# Patient Record
Sex: Female | Born: 1945 | Race: Black or African American | Hispanic: No | Marital: Single | Smoking: Former smoker
Health system: Southern US, Community
[De-identification: ages and names within clinical notes are randomized; demographics above are authoritative.]

## PROBLEM LIST (undated history)

## (undated) DIAGNOSIS — H409 Unspecified glaucoma: Secondary | ICD-10-CM

## (undated) DIAGNOSIS — I10 Essential (primary) hypertension: Secondary | ICD-10-CM

## (undated) DIAGNOSIS — E119 Type 2 diabetes mellitus without complications: Secondary | ICD-10-CM

## (undated) DIAGNOSIS — N183 Chronic kidney disease, stage 3 unspecified: Secondary | ICD-10-CM

## (undated) DIAGNOSIS — M069 Rheumatoid arthritis, unspecified: Secondary | ICD-10-CM

## (undated) DIAGNOSIS — R570 Cardiogenic shock: Secondary | ICD-10-CM

## (undated) DIAGNOSIS — J449 Chronic obstructive pulmonary disease, unspecified: Secondary | ICD-10-CM

## (undated) DIAGNOSIS — I2510 Atherosclerotic heart disease of native coronary artery without angina pectoris: Secondary | ICD-10-CM

## (undated) DIAGNOSIS — I5032 Chronic diastolic (congestive) heart failure: Secondary | ICD-10-CM

## (undated) DIAGNOSIS — E785 Hyperlipidemia, unspecified: Secondary | ICD-10-CM

## (undated) HISTORY — DX: Chronic kidney disease, stage 3 unspecified: N18.30

## (undated) HISTORY — PX: CARDIAC CATHETERIZATION: SHX172

## (undated) HISTORY — DX: Chronic kidney disease, stage 3 (moderate): N18.3

## (undated) HISTORY — DX: Atherosclerotic heart disease of native coronary artery without angina pectoris: I25.10

## (undated) HISTORY — DX: Morbid (severe) obesity due to excess calories: E66.01

## (undated) HISTORY — PX: COLONOSCOPY: SHX174

## (undated) HISTORY — DX: Hyperlipidemia, unspecified: E78.5

## (undated) HISTORY — DX: Essential (primary) hypertension: I10

## (undated) HISTORY — DX: Type 2 diabetes mellitus without complications: E11.9

## (undated) HISTORY — DX: Rheumatoid arthritis, unspecified: M06.9

## (undated) HISTORY — DX: Unspecified glaucoma: H40.9

## (undated) HISTORY — DX: Cardiogenic shock: R57.0

## (undated) HISTORY — DX: Chronic diastolic (congestive) heart failure: I50.32

## (undated) HISTORY — PX: EYE SURGERY: SHX253

## (undated) HISTORY — DX: Chronic obstructive pulmonary disease, unspecified: J44.9

---

## 2000-02-24 HISTORY — PX: CORONARY ARTERY BYPASS GRAFT: SHX141

## 2006-05-20 ENCOUNTER — Ambulatory Visit: Payer: Self-pay

## 2007-03-15 ENCOUNTER — Ambulatory Visit: Payer: Self-pay | Admitting: Gastroenterology

## 2007-05-23 ENCOUNTER — Ambulatory Visit: Payer: Self-pay | Admitting: Family Medicine

## 2008-06-06 ENCOUNTER — Ambulatory Visit: Payer: Self-pay | Admitting: Family Medicine

## 2008-09-22 ENCOUNTER — Inpatient Hospital Stay: Payer: Self-pay | Admitting: Internal Medicine

## 2009-09-05 ENCOUNTER — Ambulatory Visit: Payer: Self-pay | Admitting: Family Medicine

## 2009-09-16 ENCOUNTER — Ambulatory Visit: Payer: Self-pay | Admitting: Family Medicine

## 2010-02-23 HISTORY — PX: CORONARY ARTERY BYPASS GRAFT: SHX141

## 2010-11-04 ENCOUNTER — Ambulatory Visit: Payer: Self-pay | Admitting: Family Medicine

## 2011-10-29 ENCOUNTER — Inpatient Hospital Stay: Payer: Self-pay | Admitting: Internal Medicine

## 2011-10-29 LAB — CBC
HCT: 41.5 % (ref 35.0–47.0)
HGB: 13 g/dL (ref 12.0–16.0)
MCH: 26.4 pg (ref 26.0–34.0)
MCHC: 31.3 g/dL — ABNORMAL LOW (ref 32.0–36.0)
MCV: 84 fL (ref 80–100)
Platelet: 291 10*3/uL (ref 150–440)
RBC: 4.92 10*6/uL (ref 3.80–5.20)
WBC: 8.2 10*3/uL (ref 3.6–11.0)

## 2011-10-29 LAB — COMPREHENSIVE METABOLIC PANEL
Albumin: 3.5 g/dL (ref 3.4–5.0)
Anion Gap: 12 (ref 7–16)
BUN: 37 mg/dL — ABNORMAL HIGH (ref 7–18)
Bilirubin,Total: 0.5 mg/dL (ref 0.2–1.0)
Calcium, Total: 9.6 mg/dL (ref 8.5–10.1)
Chloride: 109 mmol/L — ABNORMAL HIGH (ref 98–107)
Creatinine: 1.35 mg/dL — ABNORMAL HIGH (ref 0.60–1.30)
EGFR (African American): 48 — ABNORMAL LOW
EGFR (Non-African Amer.): 41 — ABNORMAL LOW
Glucose: 98 mg/dL (ref 65–99)
Potassium: 4.7 mmol/L (ref 3.5–5.1)
SGOT(AST): 330 U/L — ABNORMAL HIGH (ref 15–37)
SGPT (ALT): 49 U/L (ref 12–78)
Total Protein: 8.3 g/dL — ABNORMAL HIGH (ref 6.4–8.2)

## 2011-10-29 LAB — TROPONIN I: Troponin-I: >40 ng/mL

## 2011-10-29 LAB — APTT: Activated PTT: 29.8 secs (ref 23.6–35.9)

## 2011-10-29 LAB — CK TOTAL AND CKMB (NOT AT ARMC)
CK, Total: 1847 U/L — ABNORMAL HIGH (ref 21–215)
CK-MB: 146.7 ng/mL — ABNORMAL HIGH (ref 0.5–3.6)

## 2011-10-29 LAB — PROTIME-INR: INR: 1.1

## 2011-10-30 DIAGNOSIS — I059 Rheumatic mitral valve disease, unspecified: Secondary | ICD-10-CM

## 2011-10-30 DIAGNOSIS — I214 Non-ST elevation (NSTEMI) myocardial infarction: Secondary | ICD-10-CM

## 2011-10-30 DIAGNOSIS — I2510 Atherosclerotic heart disease of native coronary artery without angina pectoris: Secondary | ICD-10-CM

## 2011-10-30 LAB — CBC WITH DIFFERENTIAL/PLATELET
Basophil %: 1.1 %
Eosinophil #: 0.3 10*3/uL (ref 0.0–0.7)
Eosinophil %: 4.1 %
HCT: 40.2 % (ref 35.0–47.0)
HGB: 12.8 g/dL (ref 12.0–16.0)
Lymphocyte %: 33 %
MCHC: 31.8 g/dL — ABNORMAL LOW (ref 32.0–36.0)
MCV: 85 fL (ref 80–100)
Monocyte #: 0.7 x10 3/mm (ref 0.2–0.9)
Neutrophil #: 4 10*3/uL (ref 1.4–6.5)
Neutrophil %: 52 %
RBC: 4.76 10*6/uL (ref 3.80–5.20)

## 2011-10-30 LAB — LIPID PANEL
Cholesterol: 103 mg/dL (ref 0–200)
HDL Cholesterol: 34 mg/dL — ABNORMAL LOW (ref 40–60)
Ldl Cholesterol, Calc: 51 mg/dL (ref 0–100)
Triglycerides: 91 mg/dL (ref 0–200)
VLDL Cholesterol, Calc: 18 mg/dL (ref 5–40)

## 2011-10-30 LAB — BASIC METABOLIC PANEL
Anion Gap: 8 (ref 7–16)
BUN: 25 mg/dL — ABNORMAL HIGH (ref 7–18)
Chloride: 108 mmol/L — ABNORMAL HIGH (ref 98–107)
Co2: 21 mmol/L (ref 21–32)
EGFR (African American): >60
EGFR (Non-African Amer.): >60
Glucose: 107 mg/dL — ABNORMAL HIGH (ref 65–99)
Sodium: 137 mmol/L (ref 136–145)

## 2011-10-30 LAB — CK TOTAL AND CKMB (NOT AT ARMC)
CK, Total: 1105 U/L — ABNORMAL HIGH (ref 21–215)
CK, Total: 759 U/L — ABNORMAL HIGH (ref 21–215)
CK-MB: 77.4 ng/mL — ABNORMAL HIGH (ref 0.5–3.6)
CK-MB: 43.8 ng/mL — ABNORMAL HIGH (ref 0.5–3.6)

## 2011-10-30 LAB — APTT
Activated PTT: >160 secs (ref 23.6–35.9)
Activated PTT: >160 secs (ref 23.6–35.9)

## 2011-10-31 LAB — CK TOTAL AND CKMB (NOT AT ARMC)
CK, Total: 386 U/L — ABNORMAL HIGH (ref 21–215)
CK-MB: 17.1 ng/mL — ABNORMAL HIGH (ref 0.5–3.6)

## 2011-10-31 LAB — APTT
Activated PTT: >160 secs (ref 23.6–35.9)
Activated PTT: >160 secs (ref 23.6–35.9)
Activated PTT: >160 secs (ref 23.6–35.9)

## 2011-10-31 LAB — BASIC METABOLIC PANEL
Calcium, Total: 8.9 mg/dL (ref 8.5–10.1)
Chloride: 108 mmol/L — ABNORMAL HIGH (ref 98–107)
Co2: 21 mmol/L (ref 21–32)
EGFR (Non-African Amer.): >60
Osmolality: 279 (ref 275–301)
Potassium: 4.1 mmol/L (ref 3.5–5.1)
Sodium: 139 mmol/L (ref 136–145)

## 2011-11-01 LAB — CBC WITH DIFFERENTIAL/PLATELET
Basophil #: 0.1 10*3/uL (ref 0.0–0.1)
Eosinophil #: 0.3 10*3/uL (ref 0.0–0.7)
HCT: 41.6 % (ref 35.0–47.0)
Lymphocyte #: 2.3 10*3/uL (ref 1.0–3.6)
Lymphocyte %: 28.6 %
MCH: 27.7 pg (ref 26.0–34.0)
MCHC: 31.9 g/dL — ABNORMAL LOW (ref 32.0–36.0)
MCV: 87 fL (ref 80–100)
Monocyte #: 0.6 x10 3/mm (ref 0.2–0.9)
Neutrophil #: 4.7 10*3/uL (ref 1.4–6.5)
RDW: 15.1 % — ABNORMAL HIGH (ref 11.5–14.5)
WBC: 7.9 10*3/uL (ref 3.6–11.0)

## 2011-11-01 LAB — BASIC METABOLIC PANEL
Anion Gap: 11 (ref 7–16)
BUN: 18 mg/dL (ref 7–18)
Calcium, Total: 9.2 mg/dL (ref 8.5–10.1)
Creatinine: 1.26 mg/dL (ref 0.60–1.30)
EGFR (African American): 52 — ABNORMAL LOW
EGFR (Non-African Amer.): 45 — ABNORMAL LOW
Osmolality: 283 (ref 275–301)
Potassium: 4.8 mmol/L (ref 3.5–5.1)
Sodium: 136 mmol/L (ref 136–145)

## 2011-11-01 LAB — URINALYSIS, COMPLETE
Bacteria: NONE SEEN
Bilirubin,UR: NEGATIVE
Glucose,UR: NEGATIVE mg/dL (ref 0–75)
Ketone: NEGATIVE
Leukocyte Esterase: NEGATIVE
Nitrite: NEGATIVE
Ph: 5 (ref 4.5–8.0)
Specific Gravity: 1.012 (ref 1.003–1.030)
Squamous Epithelial: NONE SEEN

## 2011-11-01 LAB — CK-MB: CK-MB: 7.1 ng/mL — ABNORMAL HIGH (ref 0.5–3.6)

## 2011-11-01 LAB — APTT
Activated PTT: 151.8 secs — ABNORMAL HIGH (ref 23.6–35.9)
Activated PTT: 45.3 secs — ABNORMAL HIGH (ref 23.6–35.9)

## 2011-11-01 LAB — PRO B NATRIURETIC PEPTIDE: B-Type Natriuretic Peptide: 3925 pg/mL — ABNORMAL HIGH (ref 0–125)

## 2011-11-02 LAB — BASIC METABOLIC PANEL
Anion Gap: 8 (ref 7–16)
BUN: 16 mg/dL (ref 7–18)
Calcium, Total: 8.7 mg/dL (ref 8.5–10.1)
EGFR (African American): 57 — ABNORMAL LOW
EGFR (Non-African Amer.): 49 — ABNORMAL LOW
Osmolality: 279 (ref 275–301)
Potassium: 4.2 mmol/L (ref 3.5–5.1)
Sodium: 136 mmol/L (ref 136–145)

## 2011-11-02 LAB — CBC WITH DIFFERENTIAL/PLATELET
Basophil #: 0 10*3/uL (ref 0.0–0.1)
Eosinophil #: 0 10*3/uL (ref 0.0–0.7)
HCT: 34.7 % — ABNORMAL LOW (ref 35.0–47.0)
Lymphocyte #: 0.9 10*3/uL — ABNORMAL LOW (ref 1.0–3.6)
Lymphocyte %: 9.5 %
MCH: 27.3 pg (ref 26.0–34.0)
MCHC: 32.5 g/dL (ref 32.0–36.0)
MCV: 84 fL (ref 80–100)
Monocyte %: 10.3 %
Neutrophil #: 7.9 10*3/uL — ABNORMAL HIGH (ref 1.4–6.5)
Neutrophil %: 79.3 %
Platelet: 233 10*3/uL (ref 150–440)
RDW: 14.8 % — ABNORMAL HIGH (ref 11.5–14.5)
WBC: 9.9 10*3/uL (ref 3.6–11.0)

## 2011-11-02 LAB — APTT
Activated PTT: 134.8 secs — ABNORMAL HIGH (ref 23.6–35.9)
Activated PTT: 140.8 secs — ABNORMAL HIGH (ref 23.6–35.9)
Activated PTT: 84.1 secs — ABNORMAL HIGH (ref 23.6–35.9)

## 2011-11-03 LAB — PROTIME-INR: INR: 1.2

## 2011-11-03 LAB — CBC WITH DIFFERENTIAL/PLATELET
Basophil #: 0 10*3/uL (ref 0.0–0.1)
Eosinophil %: 3.1 %
Lymphocyte #: 1.1 10*3/uL (ref 1.0–3.6)
Lymphocyte %: 12.4 %
MCH: 27.5 pg (ref 26.0–34.0)
Monocyte #: 1 x10 3/mm — ABNORMAL HIGH (ref 0.2–0.9)
Monocyte %: 11.5 %
Neutrophil %: 72.7 %
Platelet: 238 10*3/uL (ref 150–440)
RBC: 3.73 10*6/uL — ABNORMAL LOW (ref 3.80–5.20)
RDW: 15.1 % — ABNORMAL HIGH (ref 11.5–14.5)
WBC: 8.7 10*3/uL (ref 3.6–11.0)

## 2011-11-03 LAB — EXPECTORATED SPUTUM ASSESSMENT W REFEX TO RESP CULTURE

## 2011-11-03 LAB — BASIC METABOLIC PANEL
BUN: 11 mg/dL (ref 7–18)
Co2: 23 mmol/L (ref 21–32)
Creatinine: 1.1 mg/dL (ref 0.60–1.30)
EGFR (African American): >60
EGFR (Non-African Amer.): 53 — ABNORMAL LOW
Sodium: 136 mmol/L (ref 136–145)

## 2011-11-03 LAB — PHOSPHORUS: Phosphorus: 1.8 mg/dL — ABNORMAL LOW (ref 2.5–4.9)

## 2011-11-03 LAB — MAGNESIUM: Magnesium: 1.8 mg/dL

## 2011-11-04 LAB — CBC WITH DIFFERENTIAL/PLATELET
Basophil #: 0 10*3/uL (ref 0.0–0.1)
Eosinophil %: 4.7 %
HGB: 9.6 g/dL — ABNORMAL LOW (ref 12.0–16.0)
Lymphocyte #: 0.9 10*3/uL — ABNORMAL LOW (ref 1.0–3.6)
Lymphocyte %: 14.6 %
MCV: 83 fL (ref 80–100)
Monocyte %: 12.2 %
Neutrophil #: 4.1 10*3/uL (ref 1.4–6.5)
Neutrophil %: 68.1 %
RBC: 3.51 10*6/uL — ABNORMAL LOW (ref 3.80–5.20)
WBC: 6.1 10*3/uL (ref 3.6–11.0)

## 2011-11-04 LAB — BASIC METABOLIC PANEL
Anion Gap: 9 (ref 7–16)
BUN: 9 mg/dL (ref 7–18)
Calcium, Total: 8.8 mg/dL (ref 8.5–10.1)
EGFR (Non-African Amer.): >60
Osmolality: 288 (ref 275–301)
Sodium: 142 mmol/L (ref 136–145)

## 2011-11-05 LAB — EXPECTORATED SPUTUM ASSESSMENT W GRAM STAIN, RFLX TO RESP C

## 2011-11-06 LAB — BASIC METABOLIC PANEL
Calcium, Total: 9.1 mg/dL (ref 8.5–10.1)
Chloride: 102 mmol/L (ref 98–107)
Co2: 29 mmol/L (ref 21–32)
Creatinine: 1.11 mg/dL (ref 0.60–1.30)
Glucose: 182 mg/dL — ABNORMAL HIGH (ref 65–99)
Osmolality: 284 (ref 275–301)
Potassium: 4 mmol/L (ref 3.5–5.1)
Sodium: 139 mmol/L (ref 136–145)

## 2011-11-08 LAB — BASIC METABOLIC PANEL
Anion Gap: 7 (ref 7–16)
BUN: 22 mg/dL — ABNORMAL HIGH (ref 7–18)
Calcium, Total: 9.2 mg/dL (ref 8.5–10.1)
Chloride: 99 mmol/L (ref 98–107)
Co2: 29 mmol/L (ref 21–32)
Creatinine: 1.12 mg/dL (ref 0.60–1.30)
EGFR (African American): 60 — ABNORMAL LOW
Potassium: 4.1 mmol/L (ref 3.5–5.1)

## 2011-11-08 LAB — CBC WITH DIFFERENTIAL/PLATELET
Basophil #: 0 10*3/uL (ref 0.0–0.1)
Eosinophil #: 0.3 10*3/uL (ref 0.0–0.7)
HCT: 30.7 % — ABNORMAL LOW (ref 35.0–47.0)
Lymphocyte #: 1.3 10*3/uL (ref 1.0–3.6)
Lymphocyte %: 16 %
MCHC: 32.3 g/dL (ref 32.0–36.0)
MCV: 84 fL (ref 80–100)
Monocyte %: 14.2 %
Neutrophil #: 5.2 10*3/uL (ref 1.4–6.5)
Neutrophil %: 65.4 %
RDW: 14.6 % — ABNORMAL HIGH (ref 11.5–14.5)
WBC: 7.9 10*3/uL (ref 3.6–11.0)

## 2011-11-08 LAB — HEMOGLOBIN A1C: Hemoglobin A1C: 7.1 % — ABNORMAL HIGH

## 2011-11-08 LAB — CULTURE, BLOOD (SINGLE)

## 2011-11-09 LAB — BASIC METABOLIC PANEL
BUN: 22 mg/dL — ABNORMAL HIGH (ref 7–18)
Calcium, Total: 9 mg/dL (ref 8.5–10.1)
Creatinine: 1.08 mg/dL (ref 0.60–1.30)
EGFR (African American): >60
EGFR (Non-African Amer.): 54 — ABNORMAL LOW
Glucose: 248 mg/dL — ABNORMAL HIGH (ref 65–99)
Osmolality: 282 (ref 275–301)
Potassium: 4.1 mmol/L (ref 3.5–5.1)
Sodium: 135 mmol/L — ABNORMAL LOW (ref 136–145)

## 2011-11-10 LAB — BASIC METABOLIC PANEL
Calcium, Total: 9.3 mg/dL (ref 8.5–10.1)
Creatinine: 0.99 mg/dL (ref 0.60–1.30)
EGFR (Non-African Amer.): 60 — ABNORMAL LOW
Glucose: 242 mg/dL — ABNORMAL HIGH (ref 65–99)
Osmolality: 278 (ref 275–301)
Potassium: 4.1 mmol/L (ref 3.5–5.1)
Sodium: 133 mmol/L — ABNORMAL LOW (ref 136–145)

## 2011-11-11 LAB — CBC WITH DIFFERENTIAL/PLATELET
Basophil #: 0.1 10*3/uL (ref 0.0–0.1)
Basophil %: 0.9 %
Eosinophil #: 0.5 10*3/uL (ref 0.0–0.7)
Eosinophil %: 4.3 %
HGB: 9.4 g/dL — ABNORMAL LOW (ref 12.0–16.0)
Lymphocyte #: 1.5 10*3/uL (ref 1.0–3.6)
Lymphocyte %: 13.3 %
MCHC: 32.5 g/dL (ref 32.0–36.0)
MCV: 83 fL (ref 80–100)
Monocyte #: 1.5 x10 3/mm — ABNORMAL HIGH (ref 0.2–0.9)
Monocyte %: 13.6 %
Neutrophil #: 7.6 10*3/uL — ABNORMAL HIGH (ref 1.4–6.5)
Neutrophil %: 67.9 %
RBC: 3.49 10*6/uL — ABNORMAL LOW (ref 3.80–5.20)
RDW: 14.2 % (ref 11.5–14.5)
WBC: 11.2 10*3/uL — ABNORMAL HIGH (ref 3.6–11.0)

## 2011-11-11 LAB — BASIC METABOLIC PANEL
Anion Gap: 10 (ref 7–16)
BUN: 22 mg/dL — ABNORMAL HIGH (ref 7–18)
Chloride: 97 mmol/L — ABNORMAL LOW (ref 98–107)
Co2: 27 mmol/L (ref 21–32)
Creatinine: 1.05 mg/dL (ref 0.60–1.30)
EGFR (African American): >60
Glucose: 101 mg/dL — ABNORMAL HIGH (ref 65–99)
Osmolality: 272 (ref 275–301)
Potassium: 3.3 mmol/L — ABNORMAL LOW (ref 3.5–5.1)
Sodium: 134 mmol/L — ABNORMAL LOW (ref 136–145)

## 2011-11-12 LAB — BASIC METABOLIC PANEL
Anion Gap: 11 (ref 7–16)
BUN: 38 mg/dL — ABNORMAL HIGH (ref 7–18)
Calcium, Total: 9.6 mg/dL (ref 8.5–10.1)
Co2: 27 mmol/L (ref 21–32)
Creatinine: 1.52 mg/dL — ABNORMAL HIGH (ref 0.60–1.30)
EGFR (Non-African Amer.): 36 — ABNORMAL LOW
Glucose: 168 mg/dL — ABNORMAL HIGH (ref 65–99)
Osmolality: 287 (ref 275–301)
Sodium: 137 mmol/L (ref 136–145)

## 2011-11-12 LAB — CLOSTRIDIUM DIFFICILE BY PCR

## 2011-11-30 ENCOUNTER — Encounter: Payer: Medicare Other | Admitting: Cardiovascular Disease

## 2011-12-11 ENCOUNTER — Telehealth: Payer: Self-pay | Admitting: Cardiovascular Disease

## 2011-12-11 ENCOUNTER — Encounter: Payer: Medicare Other | Admitting: Cardiovascular Disease

## 2011-12-11 NOTE — Telephone Encounter (Signed)
Pt says PCP advised pt to remain off lisinopril and metoprolol until seeing cardiologist. Pt was d/c home on Plavix and carvedilol (among other meds). D/C instructions state for pt to hold lisinopril and metoprolol d/t low BP.  I advised pt to remain on both coreg and Plavix, as well as all other meds on d/c list from hosp. I advised her to continue to hold metoprolol and lisinopril until appt with Dr. Mariah Milling Understanding verb.

## 2011-12-11 NOTE — Telephone Encounter (Signed)
Pt was rescheduled to 11/07 for Dr. Mariah Milling for a post hospital visit.  Per pt's instructions she was not to take Metoprolol or Lisinopril unless the cardiologist instructs her to.  She also has questions about whether or not to continue taking Plavix and Coreg.  Please call to advise.

## 2011-12-11 NOTE — Telephone Encounter (Signed)
LMTCB

## 2011-12-17 ENCOUNTER — Ambulatory Visit: Payer: Self-pay | Admitting: Family Medicine

## 2011-12-31 ENCOUNTER — Ambulatory Visit (INDEPENDENT_AMBULATORY_CARE_PROVIDER_SITE_OTHER): Payer: Medicare Other | Admitting: Cardiovascular Disease

## 2011-12-31 ENCOUNTER — Encounter: Payer: Self-pay | Admitting: Cardiovascular Disease

## 2011-12-31 VITALS — BP 110/79 | HR 72 | Ht 61.0 in | Wt 200.0 lb

## 2011-12-31 DIAGNOSIS — E119 Type 2 diabetes mellitus without complications: Secondary | ICD-10-CM | POA: Insufficient documentation

## 2011-12-31 DIAGNOSIS — I25810 Atherosclerosis of coronary artery bypass graft(s) without angina pectoris: Secondary | ICD-10-CM | POA: Insufficient documentation

## 2011-12-31 DIAGNOSIS — I10 Essential (primary) hypertension: Secondary | ICD-10-CM

## 2011-12-31 DIAGNOSIS — E785 Hyperlipidemia, unspecified: Secondary | ICD-10-CM

## 2011-12-31 DIAGNOSIS — I519 Heart disease, unspecified: Secondary | ICD-10-CM

## 2011-12-31 MED ORDER — LISINOPRIL 10 MG PO TABS: 10.0000 mg | ORAL_TABLET | Freq: Every day | ORAL | Status: DC

## 2011-12-31 MED ORDER — ATORVASTATIN CALCIUM 80 MG PO TABS: 80.0000 mg | ORAL_TABLET | Freq: Every day | ORAL | Status: DC

## 2011-12-31 NOTE — Patient Instructions (Addendum)
You are doing well. Continue to hold the metoprolol,  take coreg twice a day Take lisinopril 1/2 pill (5 mg once a day) Increase the atorvastatin to 80 mg daily Continue on other meds  Please call us if you have new issues that need to be addressed before your next appt.  Your physician wants you to follow-up in: 3 months.  You will receive a reminder letter in the mail two months in advance. If you don't receive a letter, please call our office to schedule the follow-up appointment.

## 2011-12-31 NOTE — Assessment & Plan Note (Signed)
We will continue Coreg, add lisinopril 5 mg daily with slow titration upwards.

## 2011-12-31 NOTE — Assessment & Plan Note (Signed)
We have encouraged continued exercise, careful diet management in an effort to lose weight. 

## 2011-12-31 NOTE — Progress Notes (Signed)
Patient ID: Toni Marshall, female    DOB: [birth year], 66 y.o.   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 She presents to establish care in the office  She had a long complicated hospital course with acute respiratory failure, bibasilar pneumonia. She is on a ventilator and required prolonged resuscitation.  Catheterization in 10/30/2011 showed severe three-vessel coronary artery disease, patent vein graft to the RCA, occluded vein graft to the OM and LIMA to the LAD. Attempted LAD PCI which was unsuccessful. Left main is moderately calcified, 99% mid LAD followed by a 90% lesion, 50% distal LAD disease, diagonal #2 had 90% ostial disease, 90% mid circumflex, proximal 60% RCA disease, LIMA graft with 100% stenosis at the graft ostium, saphenous vein graft to the OM with 100% ostial graft disease, vein graft to the distal RCA showing 30% proximal graft disease, 40% mid graft disease, ejection fraction 40%  Echocardiogram showing ejection fraction 40-45%, apical akinesis with severe distal anterior and distal inferior wall hypokinesis, mild MR  She spent time at [facility]/rehabilitation and now reports that she feels well with no chest pain or shortness of breath. She is walking better without assistance and overall feels well with no complaints.  EKG shows normal sinus rhythm with rate 72 beats per minute with nonspecific ST abnormality in 1 and aVL   Outpatient Encounter Prescriptions as of 12/31/2011  Medication Sig Dispense Refill  . acetaminophen (TYLENOL) 325 MG tablet Take 325 mg by mouth every 6 (six) hours as needed.       Marland Kitchen aspirin 81 MG tablet Take 81 mg by mouth daily.      . Brimonidine Tartrate-Timolol (COMBIGAN OP) Apply to eye.        . carvedilol (COREG) 3.125 MG tablet Take 3.125 mg by mouth 2 (two) times daily with a meal.      . clopidogrel (PLAVIX) 75 MG tablet Take 75 mg by mouth daily.      . folic acid (FOLVITE) 400 MCG tablet Take 400 mcg by mouth daily.      . furosemide (LASIX) 40 MG tablet Take 40 mg by mouth daily.      . magnesium oxide (MAG-OX) 400 MG tablet Take 400 mg by mouth 2 (two) times daily.       . metFORMIN (GLUCOPHAGE) 500 MG tablet Take 500 mg by mouth 2 (two) times daily with a meal.      . potassium chloride (K-DUR) 10 MEQ tablet Take 10 mEq by mouth 2 (two) times daily.      Marland Kitchen  atorvastatin (LIPITOR) 40 MG tablet Take 40 mg by mouth daily.          Review of Systems  Constitutional: Negative.   HENT: Negative.   Eyes: Negative.   Respiratory: Negative.   Cardiovascular: Negative.   Gastrointestinal: Negative.   Musculoskeletal: Negative.   Skin: Negative.   Neurological: Negative.   Hematological: Negative.   Psychiatric/Behavioral: Negative.   All other systems reviewed and are negative.    BP 110/79  Pulse 72  Ht 5\' 1"  (1.549 m)  Wt 200 lb (90.719 kg)  BMI 37.79 kg/m2  Physical Exam  Nursing note and vitals reviewed. Constitutional: She is oriented to person, place, and time. She appears well-developed and well-nourished.  HENT:  Head:  Normocephalic.  Nose: Nose normal.  Mouth/Throat: Oropharynx is clear and moist.  Eyes: Conjunctivae normal are normal. Pupils are equal, round, and reactive to light.  Neck: Normal range of motion. Neck supple. No JVD present.  Cardiovascular: Normal rate, regular rhythm, S1 normal, S2 normal, normal heart sounds and intact distal pulses.  Exam reveals no gallop and no friction rub.   No murmur heard. Pulmonary/Chest: Effort normal and breath sounds normal. No respiratory distress. She has no wheezes. She has no rales. She exhibits no tenderness.  Abdominal: Soft. Bowel sounds are normal. She exhibits no distension. There is no  tenderness.  Musculoskeletal: Normal range of motion. She exhibits no edema and no tenderness.  Lymphadenopathy:    She has no cervical adenopathy.  Neurological: She is alert and oriented to person, place, and time. Coordination normal.  Skin: Skin is warm and dry. No rash noted. No erythema.  Psychiatric: She has a normal mood and affect. Her behavior is normal. Judgment and thought content normal.         Assessment and Plan

## 2011-12-31 NOTE — Assessment & Plan Note (Signed)
Blood pressure is well controlled on today's visit. No changes made to the medications. 

## 2012-01-11 ENCOUNTER — Telehealth: Payer: Self-pay

## 2012-01-11 NOTE — Telephone Encounter (Signed)
Will set reminder to call pt in 2 days

## 2012-01-11 NOTE — Telephone Encounter (Signed)
She should probably have followup in the next several weeks with me if she continues to have symptoms

## 2012-01-11 NOTE — Telephone Encounter (Signed)
Dr. Mariah Milling received t/c from Dr. Cliffton Asters about pt He advised Dr. Cliffton Asters to have pt start "Ranexa 500 mg PO BID x 1 week then increase to 1000 mg PO BID x 1 week and to have pt pick up samples at [facility]" VO Dr. Alvis Lemmings, RN Dr. Cliffton Asters to inform pt Samples left at [facility]

## 2012-01-13 ENCOUNTER — Encounter: Payer: Self-pay | Admitting: Cardiovascular Disease

## 2012-01-13 ENCOUNTER — Telehealth: Payer: Self-pay

## 2012-01-13 NOTE — Telephone Encounter (Signed)
Message copied by Marcelle Overlie on Wed Jan 13, 2012  9:53 AM ------      Message from: [facility], Allean Montfort E      Created: Mon Jan 11, 2012  2:11 PM      Regarding: cp       Call to assess symptoms per Dr. Mariah Milling

## 2012-01-13 NOTE — Telephone Encounter (Signed)
LMTCB re: CP assessment/ranexa start

## 2012-01-18 ENCOUNTER — Encounter: Payer: Self-pay | Admitting: Cardiovascular Disease

## 2012-01-25 ENCOUNTER — Telehealth: Payer: Self-pay | Admitting: Cardiovascular Disease

## 2012-01-25 NOTE — Telephone Encounter (Signed)
Pt says she has been having dizziness upon standing since Saturday BP checked while I was on phone=152/87, HR=92 BPM Pt says she is now taking Ranexa 1000 mg BID and chest pressure has improved denies nausea, vomiting or diarrhea Admits to constipation Denies blood in stool or urine She has appt with Dr. Gollan tomorrow am I advised she try to stay well hydrated and lie supine as much as possible today and keep appt with us tomm She verb understanding and will go to ER should symptoms worsen/change 

## 2012-01-25 NOTE — Telephone Encounter (Signed)
fyi

## 2012-01-25 NOTE — Telephone Encounter (Signed)
Patient called stated she has been feeling dizzy and lightheaded.  Schedule her to be seen tomorrow, please call to check on patient today.

## 2012-01-25 NOTE — Telephone Encounter (Signed)
Duplicate note See todays telephone note

## 2012-01-26 ENCOUNTER — Encounter: Payer: Self-pay | Admitting: Cardiovascular Disease

## 2012-01-26 ENCOUNTER — Ambulatory Visit (INDEPENDENT_AMBULATORY_CARE_PROVIDER_SITE_OTHER): Payer: Medicare Other | Admitting: Cardiovascular Disease

## 2012-01-26 VITALS — BP 110/64 | HR 75 | Ht 61.0 in | Wt 197.2 lb

## 2012-01-26 DIAGNOSIS — E785 Hyperlipidemia, unspecified: Secondary | ICD-10-CM

## 2012-01-26 DIAGNOSIS — R0789 Other chest pain: Secondary | ICD-10-CM

## 2012-01-26 DIAGNOSIS — I25810 Atherosclerosis of coronary artery bypass graft(s) without angina pectoris: Secondary | ICD-10-CM

## 2012-01-26 DIAGNOSIS — I5189 Other ill-defined heart diseases: Secondary | ICD-10-CM

## 2012-01-26 DIAGNOSIS — E119 Type 2 diabetes mellitus without complications: Secondary | ICD-10-CM

## 2012-01-26 DIAGNOSIS — R42 Dizziness and giddiness: Secondary | ICD-10-CM

## 2012-01-26 DIAGNOSIS — I10 Essential (primary) hypertension: Secondary | ICD-10-CM

## 2012-01-26 DIAGNOSIS — I519 Heart disease, unspecified: Secondary | ICD-10-CM

## 2012-01-26 NOTE — Progress Notes (Signed)
Patient ID: Toni Marshall, female    DOB: Oct 27, 1945, 66 y.o.   MRN: [id number]  HPI Comments: Toni Marshall is a very pleasant 66 year old woman with history of coronary artery disease, bypass surgery in 2002, repeat bypass November 2012 at [facility], diabetes, hypertension, hyperlipidemia, obesity with non-STEMI 10/29/2011 with presentation to [facility]. She presents for routine followup  She had a long complicated hospital course with acute respiratory failure, bibasilar pneumonia. She is on a ventilator and required prolonged resuscitation.  Catheterization in 10/30/2011 showed severe three-vessel coronary artery disease, patent vein graft to the RCA, occluded vein graft to the OM and LIMA to the LAD. Attempted LAD PCI which was unsuccessful. Left main is moderately calcified, 99% mid LAD followed by a 90% lesion, 50% distal LAD disease, diagonal #2 had 90% ostial disease, 90% mid circumflex, proximal 60% RCA disease, LIMA graft with 100% stenosis at the graft ostium, saphenous vein graft to the OM with 100% ostial graft disease, vein graft to the distal RCA showing 30% proximal graft disease, 40% mid graft disease, ejection fraction 40%  Echocardiogram showing ejection fraction 40-45%, apical akinesis with severe distal anterior and distal inferior wall hypokinesis, mild MR  She spent time at [facility]/rehabilitation. She presents today and reports having rare episodes of lightheadedness, orthostasis. Symptoms seem to happen when she stands up. Symptoms have been rare, mild. She has run out of ranexa. She feels this was helping her chest tightness. She has otherwise been active  EKG shows normal sinus rhythm with rate 75 beats per minute with nonspecific ST abnormality in 1 and aVL   Outpatient Encounter Prescriptions as of 01/26/2012  Medication Sig Dispense Refill  . acetaminophen (TYLENOL) 325 MG tablet Take 325 mg by mouth every 6 (six) hours as needed.       Marland Kitchen aspirin 81 MG tablet  Take 81 mg by mouth daily.      Marland Kitchen atorvastatin (LIPITOR) 80 MG tablet Take 1 tablet (80 mg total) by mouth daily.  90 tablet  3  . Brimonidine Tartrate-Timolol (COMBIGAN OP) Apply to eye.      . carvedilol (COREG) 3.125 MG tablet Take 3.125 mg by mouth 2 (two) times daily with a meal.      . clopidogrel (PLAVIX) 75 MG tablet Take 75 mg by mouth daily.      . folic acid (FOLVITE) 400 MCG tablet Take 400 mcg by mouth daily.      . furosemide (LASIX) 40 MG tablet Take 40 mg by mouth daily.      Marland Kitchen lisinopril (PRINIVIL,ZESTRIL) 10 MG tablet Take 5 mg by mouth daily.      . magnesium oxide (MAG-OX) 400 MG tablet Take 400 mg by mouth 2 (two) times daily.       . metFORMIN (GLUCOPHAGE) 500 MG tablet Take 500 mg by mouth 2 (two) times daily with a meal.      . potassium chloride (K-DUR) 10 MEQ tablet Take 10 mEq by mouth 2 (two) times daily.      . ranolazine (RANEXA) 1000 MG SR tablet Take 500 mg by mouth 2 (two) times daily.      . [DISCONTINUED] lisinopril (PRINIVIL,ZESTRIL) 10 MG tablet Take 1 tablet (10 mg total) by mouth daily.  90 tablet  3    Review of Systems  Constitutional: Negative.   HENT: Negative.   Eyes: Negative.   Respiratory: Positive for chest tightness.   Cardiovascular: Negative.   Gastrointestinal: Negative.   Musculoskeletal: Negative.  Skin: Negative.   Neurological: Positive for light-headedness.  Hematological: Negative.   Psychiatric/Behavioral: Negative.   All other systems reviewed and are negative.    BP 110/64  Pulse 75  Ht 5\' 1"  (1.549 m)  Wt 197 lb 4 oz (89.472 kg)  BMI 37.27 kg/m2  Physical Exam  Nursing note and vitals reviewed. Constitutional: She is oriented to person, place, and time. She appears well-developed and well-nourished.  HENT:  Head: Normocephalic.  Nose: Nose normal.  Mouth/Throat: Oropharynx is clear and moist.  Eyes: Conjunctivae normal are normal. Pupils are equal, round, and reactive to light.  Neck: Normal range of motion.  Neck supple. No JVD present.  Cardiovascular: Normal rate, regular rhythm, S1 normal, S2 normal, normal heart sounds and intact distal pulses.  Exam reveals no gallop and no friction rub.   No murmur heard. Pulmonary/Chest: Effort normal and breath sounds normal. No respiratory distress. She has no wheezes. She has no rales. She exhibits no tenderness.  Abdominal: Soft. Bowel sounds are normal. She exhibits no distension. There is no tenderness.  Musculoskeletal: Normal range of motion. She exhibits no edema and no tenderness.  Lymphadenopathy:    She has no cervical adenopathy.  Neurological: She is alert and oriented to person, place, and time. Coordination normal.  Skin: Skin is warm and dry. No rash noted. No erythema.  Psychiatric: She has a normal mood and affect. Her behavior is normal. Judgment and thought content normal.         Assessment and Plan

## 2012-01-26 NOTE — Assessment & Plan Note (Signed)
Blood pressure is well controlled on today's visit. Given her recent orthostasis,  I'm concerned about low blood pressure. Systolic today was 120 on recheck . We have suggested if she has additional episodes of dizziness, she could hold her lisinopril 5 mg. If symptoms persist, hold the Lasix that day .

## 2012-01-26 NOTE — Assessment & Plan Note (Signed)
We have encouraged continued exercise, careful diet management in an effort to lose weight. 

## 2012-01-26 NOTE — Assessment & Plan Note (Signed)
Currently with no symptoms of angina. No further workup at this time. Continue current medication regimen. 

## 2012-01-26 NOTE — Assessment & Plan Note (Signed)
Continue statin. Goal LDL less than 70 

## 2012-01-26 NOTE — Assessment & Plan Note (Addendum)
No signs of heart failure on clinical exam. We have suggested she stay on her medications as above with holding medication that day for dizziness.

## 2012-01-26 NOTE — Patient Instructions (Addendum)
You are doing well. If you get dizzy, hold the lisinopril that day If symptoms persist, hold the furosemide that day  Continue ranexa 1000 mg twice a day  Please call us if you have new issues that need to be addressed before your next appt.  Your physician wants you to follow-up in: 3 months.  You will receive a reminder letter in the mail two months in advance. If you don't receive a letter, please call our office to schedule the follow-up appointment.

## 2012-01-28 ENCOUNTER — Inpatient Hospital Stay: Payer: Self-pay | Admitting: Internal Medicine

## 2012-01-28 DIAGNOSIS — R079 Chest pain, unspecified: Secondary | ICD-10-CM

## 2012-01-28 LAB — COMPREHENSIVE METABOLIC PANEL
Albumin: 3.6 g/dL (ref 3.4–5.0)
Anion Gap: 10 (ref 7–16)
Bilirubin,Total: 0.3 mg/dL (ref 0.2–1.0)
Calcium, Total: 9.3 mg/dL (ref 8.5–10.1)
Chloride: 106 mmol/L (ref 98–107)
Co2: 20 mmol/L — ABNORMAL LOW (ref 21–32)
EGFR (African American): 48 — ABNORMAL LOW
EGFR (Non-African Amer.): 41 — ABNORMAL LOW
Glucose: 119 mg/dL — ABNORMAL HIGH (ref 65–99)
Osmolality: 279 (ref 275–301)
Potassium: 4.2 mmol/L (ref 3.5–5.1)
Sodium: 136 mmol/L (ref 136–145)

## 2012-01-28 LAB — CK TOTAL AND CKMB (NOT AT ARMC)
CK, Total: 72 U/L (ref 21–215)
CK, Total: 62 U/L (ref 21–215)
CK-MB: 1 ng/mL (ref 0.5–3.6)
CK-MB: 1 ng/mL (ref 0.5–3.6)
CK-MB: 0.8 ng/mL (ref 0.5–3.6)

## 2012-01-28 LAB — CBC WITH DIFFERENTIAL/PLATELET
Basophil #: 0 10*3/uL (ref 0.0–0.1)
Eosinophil %: 3.3 %
HCT: 37.8 % (ref 35.0–47.0)
Lymphocyte %: 30.2 %
MCH: 27.2 pg (ref 26.0–34.0)
MCHC: 33 g/dL (ref 32.0–36.0)
Monocyte #: 0.6 x10 3/mm (ref 0.2–0.9)
Monocyte %: 9 %
Neutrophil %: 56.9 %
Platelet: 258 10*3/uL (ref 150–440)
RBC: 4.59 10*6/uL (ref 3.80–5.20)
RDW: 16.4 % — ABNORMAL HIGH (ref 11.5–14.5)
WBC: 6.8 10*3/uL (ref 3.6–11.0)

## 2012-01-28 LAB — URINALYSIS, COMPLETE
Blood: NEGATIVE
Glucose,UR: NEGATIVE mg/dL (ref 0–75)
Hyaline Cast: 10
Nitrite: NEGATIVE
Ph: 7 (ref 4.5–8.0)
Specific Gravity: 1.026 (ref 1.003–1.030)
Squamous Epithelial: 5

## 2012-01-28 LAB — TROPONIN I
Troponin-I: 0.09 ng/mL — ABNORMAL HIGH
Troponin-I: 0.09 ng/mL — ABNORMAL HIGH
Troponin-I: 0.08 ng/mL — ABNORMAL HIGH

## 2012-01-29 LAB — CBC WITH DIFFERENTIAL/PLATELET
Basophil #: 0 10*3/uL (ref 0.0–0.1)
Basophil %: 0.3 %
Eosinophil #: 0.3 10*3/uL (ref 0.0–0.7)
HGB: 12.9 g/dL (ref 12.0–16.0)
Lymphocyte #: 2.6 10*3/uL (ref 1.0–3.6)
MCH: 27.2 pg (ref 26.0–34.0)
MCHC: 32.8 g/dL (ref 32.0–36.0)
MCV: 83 fL (ref 80–100)
Monocyte #: 0.7 x10 3/mm (ref 0.2–0.9)
Neutrophil #: 3.1 10*3/uL (ref 1.4–6.5)
Neutrophil %: 46.1 %
Platelet: 276 10*3/uL (ref 150–440)

## 2012-01-29 LAB — COMPREHENSIVE METABOLIC PANEL
Albumin: 3.5 g/dL (ref 3.4–5.0)
Alkaline Phosphatase: 99 U/L (ref 50–136)
Anion Gap: 9 (ref 7–16)
Bilirubin,Total: 0.4 mg/dL (ref 0.2–1.0)
Calcium, Total: 9.4 mg/dL (ref 8.5–10.1)
Chloride: 103 mmol/L (ref 98–107)
Creatinine: 1.46 mg/dL — ABNORMAL HIGH (ref 0.60–1.30)
EGFR (African American): 43 — ABNORMAL LOW
Osmolality: 277 (ref 275–301)
Potassium: 4.2 mmol/L (ref 3.5–5.1)
Sodium: 135 mmol/L — ABNORMAL LOW (ref 136–145)

## 2012-01-30 LAB — URINE CULTURE

## 2012-03-09 ENCOUNTER — Other Ambulatory Visit: Payer: Self-pay

## 2012-03-09 MED ORDER — RANOLAZINE ER 1000 MG PO TB12: 1000.0000 mg | ORAL_TABLET | Freq: Two times a day (BID) | ORAL | Status: DC

## 2012-04-01 ENCOUNTER — Ambulatory Visit (INDEPENDENT_AMBULATORY_CARE_PROVIDER_SITE_OTHER): Payer: Medicare Other | Admitting: Cardiovascular Disease

## 2012-04-01 ENCOUNTER — Encounter: Payer: Self-pay | Admitting: Cardiovascular Disease

## 2012-04-01 VITALS — BP 110/72 | HR 75 | Ht 61.0 in | Wt 199.5 lb

## 2012-04-01 DIAGNOSIS — E785 Hyperlipidemia, unspecified: Secondary | ICD-10-CM

## 2012-04-01 DIAGNOSIS — E119 Type 2 diabetes mellitus without complications: Secondary | ICD-10-CM

## 2012-04-01 DIAGNOSIS — I10 Essential (primary) hypertension: Secondary | ICD-10-CM

## 2012-04-01 DIAGNOSIS — I519 Heart disease, unspecified: Secondary | ICD-10-CM

## 2012-04-01 DIAGNOSIS — I25810 Atherosclerosis of coronary artery bypass graft(s) without angina pectoris: Secondary | ICD-10-CM

## 2012-04-01 NOTE — Assessment & Plan Note (Signed)
Currently with no symptoms of angina. No further workup at this time. Continue current medication regimen. 

## 2012-04-01 NOTE — Assessment & Plan Note (Signed)
Blood pressure is well controlled on today's visit. No changes made to the medications. 

## 2012-04-01 NOTE — Assessment & Plan Note (Signed)
Continue statin, goal LDL less than 70

## 2012-04-01 NOTE — Assessment & Plan Note (Signed)
No clinical signs of heart failure. We have suggested she call us for worsening edema or shortness of breath

## 2012-04-01 NOTE — Progress Notes (Signed)
Patient ID: Toni Marshall, female    DOB: December 18, 1945, 67 y.o.   MRN: [id number]  HPI Comments: Toni Marshall is a very pleasant 66 year old woman with history of coronary artery disease, bypass surgery in 2002, repeat bypass November 2012 at [facility], diabetes, hypertension, hyperlipidemia, obesity with non-STEMI 10/29/2011 with presentation to [facility]. She presents for routine followup  She had a long complicated hospital course with acute respiratory failure, bibasilar pneumonia. She is on a ventilator and required prolonged resuscitation.  Catheterization in 10/30/2011 showed severe three-vessel coronary artery disease, patent vein graft to the RCA, occluded vein graft to the OM and LIMA to the LAD. Attempted LAD PCI which was unsuccessful. Left main is moderately calcified, 99% mid LAD followed by a 90% lesion, 50% distal LAD disease, diagonal #2 had 90% ostial disease, 90% mid circumflex, proximal 60% RCA disease, LIMA graft with 100% stenosis at the graft ostium, saphenous vein graft to the OM with 100% ostial graft disease, vein graft to the distal RCA showing 30% proximal graft disease, 40% mid graft disease, ejection fraction 40%  Echocardiogram showing ejection fraction 40-45%, apical akinesis with severe distal anterior and distal inferior wall hypokinesis, mild MR  She spent time at [facility]/rehabilitation. Overall she reports that she is doing well. No shortness of breath, no significant edema. She's tried exercise. Weight is up to 200 pounds which is high for her. She's not exercising as much as she is staying inside with the cold weather. She denies any significant chest pain or shortness of breath with exertion.  EKG shows normal sinus rhythm with rate 75 beats per minute with nonspecific ST abnormality in 1 and aVL   Outpatient Encounter Prescriptions as of 04/01/2012  Medication Sig Dispense Refill  . acetaminophen (TYLENOL) 325 MG tablet Take 325 mg by mouth every 6 (six)  hours as needed.       Marland Kitchen aspirin 81 MG tablet Take 81 mg by mouth daily.      Marland Kitchen atorvastatin (LIPITOR) 80 MG tablet Take 1 tablet (80 mg total) by mouth daily.  90 tablet  3  . Brimonidine Tartrate-Timolol (COMBIGAN OP) Apply to eye.      . carvedilol (COREG) 3.125 MG tablet Take 3.125 mg by mouth 2 (two) times daily with a meal.      . clopidogrel (PLAVIX) 75 MG tablet Take 75 mg by mouth daily.      . folic acid (FOLVITE) 400 MCG tablet Take 400 mcg by mouth daily.      . furosemide (LASIX) 40 MG tablet Take 40 mg by mouth daily.      Marland Kitchen lisinopril (PRINIVIL,ZESTRIL) 10 MG tablet Take 5 mg by mouth daily.      . magnesium oxide (MAG-OX) 400 MG tablet Take 400 mg by mouth 2 (two) times daily.       . metFORMIN (GLUCOPHAGE) 500 MG tablet Take 500 mg by mouth 2 (two) times daily with a meal.      . potassium chloride (K-DUR) 10 MEQ tablet Take 10 mEq by mouth 2 (two) times daily.      . ranolazine (RANEXA) 1000 MG SR tablet Take 1 tablet (1,000 mg total) by mouth 2 (two) times daily.  84 tablet  0    Review of Systems  Constitutional: Negative.   HENT: Negative.   Eyes: Negative.   Cardiovascular: Negative.   Gastrointestinal: Negative.   Musculoskeletal: Negative.   Skin: Negative.   Hematological: Negative.   Psychiatric/Behavioral: Negative.  All other systems reviewed and are negative.    BP 110/72  Pulse 75  Ht 5\' 1"  (1.549 m)  Wt 199 lb 8 oz (90.493 kg)  BMI 37.70 kg/m2  Physical Exam  Nursing note and vitals reviewed. Constitutional: She is oriented to person, place, and time. She appears well-developed and well-nourished.  HENT:  Head: Normocephalic.  Nose: Nose normal.  Mouth/Throat: Oropharynx is clear and moist.  Eyes: Conjunctivae normal are normal. Pupils are equal, round, and reactive to light.  Neck: Normal range of motion. Neck supple. No JVD present.  Cardiovascular: Normal rate, regular rhythm, S1 normal, S2 normal, normal heart sounds and intact distal  pulses.  Exam reveals no gallop and no friction rub.   No murmur heard. Pulmonary/Chest: Effort normal and breath sounds normal. No respiratory distress. She has no wheezes. She has no rales. She exhibits no tenderness.  Abdominal: Soft. Bowel sounds are normal. She exhibits no distension. There is no tenderness.  Musculoskeletal: Normal range of motion. She exhibits no edema and no tenderness.  Lymphadenopathy:    She has no cervical adenopathy.  Neurological: She is alert and oriented to person, place, and time. Coordination normal.  Skin: Skin is warm and dry. No rash noted. No erythema.  Psychiatric: She has a normal mood and affect. Her behavior is normal. Judgment and thought content normal.         Assessment and Plan

## 2012-04-01 NOTE — Patient Instructions (Addendum)
You are doing well. No medication changes were made.  Please call us if you have new issues that need to be addressed before your next appt.  Your physician wants you to follow-up in: 6 months.  You will receive a reminder letter in the mail two months in advance. If you don't receive a letter, please call our office to schedule the follow-up appointment.   

## 2012-04-01 NOTE — Assessment & Plan Note (Signed)
She has been eating sweets and not checking her sugars. We have encouraged continued exercise, careful diet management in an effort to lose weight.

## 2012-05-24 ENCOUNTER — Telehealth: Payer: Self-pay

## 2012-05-24 NOTE — Telephone Encounter (Signed)
samples

## 2012-05-24 NOTE — Telephone Encounter (Signed)
Pt would like samples of Ranexa 1000 mg

## 2012-05-24 NOTE — Telephone Encounter (Signed)
Pt aware that samples of Ranexa are upfront for pick up.

## 2012-06-16 ENCOUNTER — Telehealth: Payer: Self-pay

## 2012-06-16 NOTE — Telephone Encounter (Signed)
Pt states she is not sure if she needs to keep taking this, if so please call to pharmacy.

## 2012-06-16 NOTE — Telephone Encounter (Signed)
Spoke with pt and she is aware that Dr. Mariah Milling would like her to take Ranexa 1000 bid last visit 04/01/12. She requested samples and is aware that samples have been placed up front for her to pick up.

## 2012-06-30 ENCOUNTER — Encounter: Payer: Self-pay | Admitting: Cardiovascular Disease

## 2012-06-30 ENCOUNTER — Ambulatory Visit (INDEPENDENT_AMBULATORY_CARE_PROVIDER_SITE_OTHER): Payer: Medicare Other | Admitting: Cardiovascular Disease

## 2012-06-30 VITALS — BP 100/64 | HR 69 | Ht 61.0 in | Wt 201.2 lb

## 2012-06-30 DIAGNOSIS — E119 Type 2 diabetes mellitus without complications: Secondary | ICD-10-CM

## 2012-06-30 DIAGNOSIS — I10 Essential (primary) hypertension: Secondary | ICD-10-CM

## 2012-06-30 DIAGNOSIS — I25810 Atherosclerosis of coronary artery bypass graft(s) without angina pectoris: Secondary | ICD-10-CM

## 2012-06-30 DIAGNOSIS — E785 Hyperlipidemia, unspecified: Secondary | ICD-10-CM

## 2012-06-30 NOTE — Progress Notes (Signed)
Patient ID: Toni Marshall, female    DOB: April 06, 1945, 67 y.o.   MRN: [id number]  HPI Comments: Toni Marshall is a very pleasant 67 year old woman with history of coronary artery disease, bypass surgery in 2002, repeat bypass November 2012 at [facility], diabetes, hypertension, hyperlipidemia, obesity with non-STEMI 10/29/2011 with presentation to [facility]. She presents for routine followup  Previous long complicated hospital course with acute respiratory failure, bibasilar pneumonia. She is on a ventilator and required prolonged resuscitation.  Catheterization in 10/30/2011 showed severe three-vessel coronary artery disease, patent vein graft to the RCA, occluded vein graft to the OM and LIMA to the LAD. Attempted LAD PCI which was unsuccessful. Left main is moderately calcified, 99% mid LAD followed by a 90% lesion, 50% distal LAD disease, diagonal #2 had 90% ostial disease, 90% mid circumflex, proximal 60% RCA disease, LIMA graft with 100% stenosis at the graft ostium, saphenous vein graft to the OM with 100% ostial graft disease, vein graft to the distal RCA showing 30% proximal graft disease, 40% mid graft disease, ejection fraction 40%  Echocardiogram showing ejection fraction 40-45%, apical akinesis with severe distal anterior and distal inferior wall hypokinesis, mild MR  Overall she reports that she is doing well. No shortness of breath, no significant edema. Weight is up. She's not exercising.  She denies any significant chest pain or shortness of breath with exertion. Reports having labs at the [facility] clinic. Takes Ranexa 1000 mg at nighttime to save money, rather than twice a day. Believes it does help her chest discomfort  EKG shows normal sinus rhythm with rate 69 beats per minute with nonspecific ST abnormality in 1 and aVL   Outpatient Encounter Prescriptions as of 06/30/2012  Medication Sig Dispense Refill  . acetaminophen (TYLENOL) 325 MG tablet Take 325 mg by mouth every 6 (six)  hours as needed.       Marland Kitchen aspirin 81 MG tablet Take 81 mg by mouth daily.      Marland Kitchen atorvastatin (LIPITOR) 80 MG tablet Take 1 tablet (80 mg total) by mouth daily.  90 tablet  3  . Brimonidine Tartrate-Timolol (COMBIGAN OP) Apply to eye.      . carvedilol (COREG) 3.125 MG tablet Take 3.125 mg by mouth 2 (two) times daily with a meal.      . clopidogrel (PLAVIX) 75 MG tablet Take 75 mg by mouth daily.      . folic acid (FOLVITE) 400 MCG tablet Take 400 mcg by mouth daily.      . furosemide (LASIX) 40 MG tablet Take 40 mg by mouth daily.      Marland Kitchen lisinopril (PRINIVIL,ZESTRIL) 10 MG tablet Take 5 mg by mouth daily.      . magnesium oxide (MAG-OX) 400 MG tablet Take 400 mg by mouth 2 (two) times daily.       . metFORMIN (GLUCOPHAGE) 500 MG tablet Take 500 mg by mouth 2 (two) times daily with a meal.      . potassium chloride (K-DUR) 10 MEQ tablet Take 10 mEq by mouth 2 (two) times daily.      . ranolazine (RANEXA) 1000 MG SR tablet Take 1,000 mg by mouth daily.      . [DISCONTINUED] ranolazine (RANEXA) 1000 MG SR tablet Take 1 tablet (1,000 mg total) by mouth 2 (two) times daily.  84 tablet  0   No facility-administered encounter medications on file as of 06/30/2012.    Review of Systems  Constitutional: Negative.   HENT: Negative.  Eyes: Negative.   Respiratory: Negative.   Cardiovascular: Negative.   Gastrointestinal: Negative.   Musculoskeletal: Negative.   Skin: Negative.   Neurological: Negative.   Psychiatric/Behavioral: Negative.   All other systems reviewed and are negative.    BP 100/64  Pulse 69  Ht 5\' 1"  (1.549 m)  Wt 201 lb 4 oz (91.286 kg)  BMI 38.05 kg/m2  Physical Exam  Nursing note and vitals reviewed. Constitutional: She is oriented to person, place, and time. She appears well-developed and well-nourished.  Obese  HENT:  Head: Normocephalic.  Nose: Nose normal.  Mouth/Throat: Oropharynx is clear and moist.  Eyes: Conjunctivae are normal. Pupils are equal, round,  and reactive to light.  Neck: Normal range of motion. Neck supple. No JVD present.  Cardiovascular: Normal rate, regular rhythm, S1 normal, S2 normal, normal heart sounds and intact distal pulses.  Exam reveals no gallop and no friction rub.   No murmur heard. Pulmonary/Chest: Effort normal and breath sounds normal. No respiratory distress. She has no wheezes. She has no rales. She exhibits no tenderness.  Abdominal: Soft. Bowel sounds are normal. She exhibits no distension. There is no tenderness.  Musculoskeletal: Normal range of motion. She exhibits no edema and no tenderness.  Lymphadenopathy:    She has no cervical adenopathy.  Neurological: She is alert and oriented to person, place, and time. Coordination normal.  Skin: Skin is warm and dry. No rash noted. No erythema.  Psychiatric: She has a normal mood and affect. Her behavior is normal. Judgment and thought content normal.    Assessment and Plan

## 2012-06-30 NOTE — Patient Instructions (Addendum)
You are doing well. No medication changes were made.  Watch the cupcakes and honey buns!!  Please call us if you have new issues that need to be addressed before your next appt.  Your physician wants you to follow-up in: 6 months.  You will receive a reminder letter in the mail two months in advance. If you don't receive a letter, please call our office to schedule the follow-up appointment.

## 2012-06-30 NOTE — Assessment & Plan Note (Signed)
Currently with no symptoms of angina. No further workup at this time. Continue current medication regimen. Encouraged strict diabetes control

## 2012-06-30 NOTE — Assessment & Plan Note (Signed)
Blood pressure is well controlled on today's visit. No changes made to the medications. 

## 2012-06-30 NOTE — Assessment & Plan Note (Signed)
We have encouraged continued exercise, careful diet management in an effort to lose weight. 

## 2012-06-30 NOTE — Assessment & Plan Note (Signed)
Encouraged her to stay on her Lipitor. Goal LDL less than 70. Most recent labs not available

## 2012-08-12 ENCOUNTER — Other Ambulatory Visit: Payer: Self-pay | Admitting: *Deleted

## 2012-08-12 MED ORDER — RANOLAZINE ER 1000 MG PO TB12: 1000.0000 mg | ORAL_TABLET | Freq: Two times a day (BID) | ORAL | Status: DC

## 2012-08-12 NOTE — Telephone Encounter (Signed)
Pt out of samples of Ranexa and needs refill sent/ done.

## 2012-09-14 ENCOUNTER — Telehealth: Payer: Self-pay | Admitting: *Deleted

## 2012-09-14 NOTE — Telephone Encounter (Signed)
Pt calls today with persistent pain in left shoulder blade & last pm had "empty feeling"  She is not sure if it is angina. " I put a nitroglycerin patch on" and has eased some. States this is similar to pain she had before with angina.  Reviewed medication list & added NTG 0.4mg  patch. States still having the pain easing somewhat. Denies any shortness of breath, no nausea. Advised pt to contact EMS & go to ED. Mylo Red RN

## 2012-09-16 ENCOUNTER — Inpatient Hospital Stay: Payer: Self-pay | Admitting: Student

## 2012-09-16 DIAGNOSIS — I4891 Unspecified atrial fibrillation: Secondary | ICD-10-CM

## 2012-09-16 LAB — CBC WITH DIFFERENTIAL/PLATELET
Basophil %: 0.6 %
Eosinophil #: 0.1 10*3/uL (ref 0.0–0.7)
Eosinophil %: 1 %
HCT: 37.4 % (ref 35.0–47.0)
HGB: 12.6 g/dL (ref 12.0–16.0)
Lymphocyte #: 1.6 10*3/uL (ref 1.0–3.6)
Lymphocyte %: 15.6 %
MCH: 28.7 pg (ref 26.0–34.0)
MCV: 85 fL (ref 80–100)
Monocyte %: 8.6 %
RBC: 4.39 10*6/uL (ref 3.80–5.20)
RDW: 14.6 % — ABNORMAL HIGH (ref 11.5–14.5)

## 2012-09-16 LAB — COMPREHENSIVE METABOLIC PANEL
Albumin: 3.1 g/dL — ABNORMAL LOW (ref 3.4–5.0)
Alkaline Phosphatase: 93 U/L (ref 50–136)
BUN: 27 mg/dL — ABNORMAL HIGH (ref 7–18)
Calcium, Total: 9.5 mg/dL (ref 8.5–10.1)
Chloride: 103 mmol/L (ref 98–107)
Co2: 22 mmol/L (ref 21–32)
EGFR (African American): 51 — ABNORMAL LOW
Glucose: 143 mg/dL — ABNORMAL HIGH (ref 65–99)
Osmolality: 272 (ref 275–301)

## 2012-09-16 LAB — PRO B NATRIURETIC PEPTIDE: B-Type Natriuretic Peptide: 425 pg/mL — ABNORMAL HIGH (ref 0–125)

## 2012-09-16 LAB — TROPONIN I: Troponin-I: 0.04 ng/mL

## 2012-09-17 LAB — BASIC METABOLIC PANEL
BUN: 28 mg/dL — ABNORMAL HIGH (ref 7–18)
Calcium, Total: 9.1 mg/dL (ref 8.5–10.1)
Chloride: 100 mmol/L (ref 98–107)
Co2: 23 mmol/L (ref 21–32)
EGFR (African American): 37 — ABNORMAL LOW
EGFR (Non-African Amer.): 32 — ABNORMAL LOW
Sodium: 133 mmol/L — ABNORMAL LOW (ref 136–145)

## 2012-09-17 LAB — CBC WITH DIFFERENTIAL/PLATELET
Basophil #: 0 10*3/uL (ref 0.0–0.1)
Basophil %: 0.6 %
Eosinophil #: 0.3 10*3/uL (ref 0.0–0.7)
Eosinophil %: 3.9 %
HCT: 37.8 % (ref 35.0–47.0)
Lymphocyte #: 2.1 10*3/uL (ref 1.0–3.6)
MCH: 28.5 pg (ref 26.0–34.0)
MCV: 86 fL (ref 80–100)
Neutrophil %: 57.5 %
Platelet: 218 10*3/uL (ref 150–440)
RBC: 4.38 10*6/uL (ref 3.80–5.20)
RDW: 14.3 % (ref 11.5–14.5)
WBC: 8 10*3/uL (ref 3.6–11.0)

## 2012-09-17 LAB — MAGNESIUM: Magnesium: 1.6 mg/dL — ABNORMAL LOW

## 2012-09-17 LAB — LIPID PANEL
HDL Cholesterol: 47 mg/dL (ref 40–60)
Ldl Cholesterol, Calc: 49 mg/dL (ref 0–100)
Triglycerides: 69 mg/dL (ref 0–200)

## 2012-09-17 LAB — HEMOGLOBIN A1C: Hemoglobin A1C: 6.9 % — ABNORMAL HIGH (ref 4.2–6.3)

## 2012-09-18 LAB — BASIC METABOLIC PANEL
Anion Gap: 7 (ref 7–16)
BUN: 24 mg/dL — ABNORMAL HIGH (ref 7–18)
Co2: 22 mmol/L (ref 21–32)
EGFR (African American): 50 — ABNORMAL LOW
Osmolality: 276 (ref 275–301)
Potassium: 4.2 mmol/L (ref 3.5–5.1)
Sodium: 135 mmol/L — ABNORMAL LOW (ref 136–145)

## 2012-09-30 ENCOUNTER — Ambulatory Visit (INDEPENDENT_AMBULATORY_CARE_PROVIDER_SITE_OTHER): Payer: Medicare Other | Admitting: Cardiovascular Disease

## 2012-09-30 ENCOUNTER — Encounter: Payer: Self-pay | Admitting: Cardiovascular Disease

## 2012-09-30 VITALS — BP 123/82 | HR 69 | Ht 61.0 in | Wt 203.2 lb

## 2012-09-30 DIAGNOSIS — I10 Essential (primary) hypertension: Secondary | ICD-10-CM

## 2012-09-30 DIAGNOSIS — R109 Unspecified abdominal pain: Secondary | ICD-10-CM | POA: Insufficient documentation

## 2012-09-30 DIAGNOSIS — E119 Type 2 diabetes mellitus without complications: Secondary | ICD-10-CM

## 2012-09-30 DIAGNOSIS — E785 Hyperlipidemia, unspecified: Secondary | ICD-10-CM

## 2012-09-30 DIAGNOSIS — I519 Heart disease, unspecified: Secondary | ICD-10-CM

## 2012-09-30 DIAGNOSIS — I25810 Atherosclerosis of coronary artery bypass graft(s) without angina pectoris: Secondary | ICD-10-CM

## 2012-09-30 MED ORDER — ISOSORBIDE MONONITRATE ER 30 MG PO TB24: 30.0000 mg | ORAL_TABLET | Freq: Every day | ORAL | Status: DC

## 2012-09-30 NOTE — Assessment & Plan Note (Signed)
Encouraged her to stay on her Lipitor 

## 2012-09-30 NOTE — Assessment & Plan Note (Signed)
We have encouraged continued exercise, careful diet management in an effort to lose weight. 

## 2012-09-30 NOTE — Assessment & Plan Note (Signed)
Normal ejection fraction on recent echocardiogram, 50-55% EF

## 2012-09-30 NOTE — Assessment & Plan Note (Signed)
Currently with no symptoms of angina. No further workup at this time. Continue current medication regimen. 

## 2012-09-30 NOTE — Progress Notes (Signed)
Patient ID: Toni Marshall, female    DOB: November 27, 1945, 67 y.o.   MRN: [id number]  HPI Comments: Toni Marshall is a very pleasant 67 year old woman with history of coronary artery disease, bypass surgery in 2002, repeat bypass November 2012 at [facility], diabetes, hypertension, hyperlipidemia, obesity with non-STEMI 10/29/2011 with presentation to [facility]. She presents for routine followup. She is seen at the [facility] clinic, Dr. Cliffton Asters  She reports having recent episode of hot, sweaty feeling, loose bowel movements with left upper quadrant discomfort. She had lightheadedness. She went to the emergency room, admitted to the hospital from July 25 2 09/19/2012.  had significant workup including CT scan of her abdomen. These were reviewed in the office today . Nonspecific finding of something in her spleen, unable to exclude splenic infarct. Was given Percocet with relief of her pain. No further GI issues since that time. She wonders if symptoms could be secondary to metformin. Otherwise she is doing well. No shortness of breath, no significant edema. Weight is up. She's not exercising.  She denies any significant chest pain or shortness of breath with exertion.   Previous long complicated hospital course with acute respiratory failure, bibasilar pneumonia. She is on a ventilator and required prolonged resuscitation.  Catheterization in 10/30/2011 showed severe three-vessel coronary artery disease, patent vein graft to the RCA, occluded vein graft to the OM and LIMA to the LAD. Attempted LAD PCI which was unsuccessful. Left main is moderately calcified, 99% mid LAD followed by a 90% lesion, 50% distal LAD disease, diagonal #2 had 90% ostial disease, 90% mid circumflex, proximal 60% RCA disease, LIMA graft with 100% stenosis at the graft ostium, saphenous vein graft to the OM with 100% ostial graft disease, vein graft to the distal RCA showing 30% proximal graft disease, 40% mid graft disease, ejection  fraction 40%  Echocardiogram showing ejection fraction 40-45%, apical akinesis with severe distal anterior and distal inferior wall hypokinesis, mild MR Recent echocardiogram in the hospital 09/16/2012 showed ejection fraction 50-55%, otherwise normal study  EKG shows normal sinus rhythm with rate 69 beats per minute with nonspecific ST abnormality in 1 and aVL   Outpatient Encounter Prescriptions as of 09/30/2012  Medication Sig Dispense Refill  . acetaminophen (TYLENOL) 325 MG tablet Take 325 mg by mouth every 6 (six) hours as needed.       Marland Kitchen aspirin 81 MG tablet Take 81 mg by mouth daily.      Marland Kitchen atorvastatin (LIPITOR) 80 MG tablet Take 1 tablet (80 mg total) by mouth daily.  90 tablet  3  . Brimonidine Tartrate-Timolol (COMBIGAN OP) Apply to eye.      . carvedilol (COREG) 3.125 MG tablet Take 3.125 mg by mouth 2 (two) times daily with a meal.      . clopidogrel (PLAVIX) 75 MG tablet Take 75 mg by mouth daily.      . folic acid (FOLVITE) 400 MCG tablet Take 400 mcg by mouth daily.      . furosemide (LASIX) 40 MG tablet Take 40 mg by mouth daily.      Marland Kitchen lisinopril (PRINIVIL,ZESTRIL) 10 MG tablet Take 10 mg by mouth daily.       . magnesium oxide (MAG-OX) 400 MG tablet Take 400 mg by mouth 2 (two) times daily.       . metFORMIN (GLUCOPHAGE) 500 MG tablet Take 500 mg by mouth 2 (two) times daily with a meal.      . nitroGLYCERIN (NITRODUR - DOSED IN  MG/24 HR) 0.4 mg/hr Place 1 patch onto the skin daily.      Marland Kitchen oxyCODONE-acetaminophen (PERCOCET/ROXICET) 5-325 MG per tablet Take 1 tablet by mouth every 6 (six) hours as needed.       . potassium chloride (K-DUR) 10 MEQ tablet Take 10 mEq by mouth 2 (two) times daily.      . ranolazine (RANEXA) 1000 MG SR tablet Take 1 tablet (1,000 mg total) by mouth 2 (two) times daily. Take one tablet by mouth twice daily.  60 tablet  3   No facility-administered encounter medications on file as of 09/30/2012.    Review of Systems  Constitutional: Negative.    HENT: Negative.   Eyes: Negative.   Respiratory: Negative.   Cardiovascular: Negative.   Gastrointestinal: Positive for abdominal pain.       Now resolved  Musculoskeletal: Negative.   Skin: Negative.   Neurological: Negative.   Psychiatric/Behavioral: Negative.   All other systems reviewed and are negative.    BP 123/82  Pulse 69  Ht 5\' 1"  (1.549 m)  Wt 203 lb 4 oz (92.194 kg)  BMI 38.42 kg/m2  Physical Exam  Nursing note and vitals reviewed. Constitutional: She is oriented to person, place, and time. She appears well-developed and well-nourished.  Obese  HENT:  Head: Normocephalic.  Nose: Nose normal.  Mouth/Throat: Oropharynx is clear and moist.  Eyes: Conjunctivae are normal. Pupils are equal, round, and reactive to light.  Neck: Normal range of motion. Neck supple. No JVD present.  Cardiovascular: Normal rate, regular rhythm, S1 normal, S2 normal, normal heart sounds and intact distal pulses.  Exam reveals no gallop and no friction rub.   No murmur heard. Pulmonary/Chest: Effort normal and breath sounds normal. No respiratory distress. She has no wheezes. She has no rales. She exhibits no tenderness.  Abdominal: Soft. Bowel sounds are normal. She exhibits no distension. There is no tenderness.  Musculoskeletal: Normal range of motion. She exhibits no edema and no tenderness.  Lymphadenopathy:    She has no cervical adenopathy.  Neurological: She is alert and oriented to person, place, and time. Coordination normal.  Skin: Skin is warm and dry. No rash noted. No erythema.  Psychiatric: She has a normal mood and affect. Her behavior is normal. Judgment and thought content normal.    Assessment and Plan

## 2012-09-30 NOTE — Assessment & Plan Note (Signed)
Recent hospitalization admission and workup. CT scan with nonspecific splenic findings. No further workup at this time. Symptoms could have been GI related.

## 2012-09-30 NOTE — Patient Instructions (Addendum)
You are doing well. Please restart isosorbide one at night, long acting nitro  Please call us if you have new issues that need to be addressed before your next appt.  Your physician wants you to follow-up in: 3 months You will receive a reminder letter in the mail two months in advance. If you don't receive a letter, please call our office to schedule the follow-up appointment.

## 2012-09-30 NOTE — Assessment & Plan Note (Signed)
Blood pressure is well controlled on today's visit. No changes made to the medications. 

## 2012-11-26 ENCOUNTER — Other Ambulatory Visit: Payer: Self-pay | Admitting: Cardiovascular Disease

## 2012-11-28 ENCOUNTER — Other Ambulatory Visit: Payer: Self-pay | Admitting: *Deleted

## 2012-11-28 MED ORDER — ATORVASTATIN CALCIUM 80 MG PO TABS: 80.0000 mg | ORAL_TABLET | Freq: Every day | ORAL | Status: DC

## 2012-11-28 NOTE — Telephone Encounter (Signed)
Refilled atorvastatin sent to [facility] pharmacy.

## 2012-12-19 ENCOUNTER — Ambulatory Visit: Payer: Self-pay | Admitting: Family Medicine

## 2012-12-30 ENCOUNTER — Other Ambulatory Visit: Payer: Self-pay | Admitting: Cardiovascular Disease

## 2012-12-30 ENCOUNTER — Other Ambulatory Visit: Payer: Self-pay | Admitting: *Deleted

## 2012-12-30 MED ORDER — RANOLAZINE ER 1000 MG PO TB12: 1000.0000 mg | ORAL_TABLET | Freq: Two times a day (BID) | ORAL | Status: DC

## 2012-12-30 NOTE — Telephone Encounter (Signed)
Requested Prescriptions   Signed Prescriptions Disp Refills  . ranolazine (RANEXA) 1000 MG SR tablet 60 tablet 3    Sig: Take 1 tablet (1,000 mg total) by mouth 2 (two) times daily. Take one tablet by mouth twice daily.    Authorizing Provider: Antonieta Iba    Ordering User: Kendrick Fries

## 2013-01-03 ENCOUNTER — Encounter: Payer: Self-pay | Admitting: Cardiovascular Disease

## 2013-01-03 ENCOUNTER — Ambulatory Visit (INDEPENDENT_AMBULATORY_CARE_PROVIDER_SITE_OTHER): Payer: Medicare Other | Admitting: Cardiovascular Disease

## 2013-01-03 ENCOUNTER — Encounter (INDEPENDENT_AMBULATORY_CARE_PROVIDER_SITE_OTHER): Payer: Self-pay

## 2013-01-03 ENCOUNTER — Ambulatory Visit: Payer: Medicare Other | Admitting: Cardiovascular Disease

## 2013-01-03 VITALS — BP 102/72 | HR 72 | Ht 61.0 in | Wt 216.0 lb

## 2013-01-03 DIAGNOSIS — E119 Type 2 diabetes mellitus without complications: Secondary | ICD-10-CM

## 2013-01-03 DIAGNOSIS — E785 Hyperlipidemia, unspecified: Secondary | ICD-10-CM

## 2013-01-03 DIAGNOSIS — I25810 Atherosclerosis of coronary artery bypass graft(s) without angina pectoris: Secondary | ICD-10-CM

## 2013-01-03 DIAGNOSIS — I10 Essential (primary) hypertension: Secondary | ICD-10-CM

## 2013-01-03 NOTE — Assessment & Plan Note (Signed)
We have encouraged continued exercise, careful diet management in an effort to lose weight. 

## 2013-01-03 NOTE — Assessment & Plan Note (Signed)
Currently with no symptoms of angina. No further workup at this time. Continue current medication regimen. 

## 2013-01-03 NOTE — Assessment & Plan Note (Signed)
Blood pressure is well controlled on today's visit. No changes made to the medications. 

## 2013-01-03 NOTE — Assessment & Plan Note (Signed)
Encouraged her to stay on her Lipitor. Goal LDL less than 70 

## 2013-01-03 NOTE — Progress Notes (Signed)
Patient ID: Toni Marshall, female    DOB: 06-Nov-1945, 67 y.o.   MRN: [id number]  HPI Comments: Ms. Gasiorowski is a very pleasant 67 year old woman with history of coronary artery disease, bypass surgery in 2002, repeat bypass November 2012 at [facility], diabetes, hypertension, hyperlipidemia, obesity with non-STEMI 10/29/2011 with presentation to [facility]. She presents for routine followup. She is seen at the [facility] clinic, Dr. Cliffton Asters  In followup today, she reports that she is doing well. She did have a mammogram which pushed on her right chest and it is sore. Also been taking salad, wonders if she pulled some muscles on the right side. She is no longer taking metformin as there was concern of renal dysfunction. Since then sugars have been up slightly. She continues on Lasix 20 mg daily. She reports having recent lab work with Dr. Cliffton Asters.  no significant edema. Weight is up. She's not exercising.  She denies any significant chest pain or shortness of breath with exertion.   Previous long complicated hospital course with acute respiratory failure, bibasilar pneumonia. She is on a ventilator and required prolonged resuscitation.  Catheterization in 10/30/2011 showed severe three-vessel coronary artery disease, patent vein graft to the RCA, occluded vein graft to the OM and LIMA to the LAD. Attempted LAD PCI which was unsuccessful.  Left main is moderately calcified, 99% mid LAD followed by a 90% lesion, 50% distal LAD disease, diagonal #2 had 90% ostial disease, 90% mid circumflex, proximal 60% RCA disease, LIMA graft with 100% stenosis at the graft ostium, saphenous vein graft to the OM with 100% ostial graft disease, vein graft to the distal RCA showing 30% proximal graft disease, 40% mid graft disease, ejection fraction 40%  Previous Echocardiogram showing ejection fraction 40-45%, apical akinesis with severe distal anterior and distal inferior wall hypokinesis, mild MR Recent echocardiogram in the  hospital 09/16/2012 showed ejection fraction 50-55%, otherwise normal study  EKG shows normal sinus rhythm with rate 72 beats per minute with nonspecific ST abnormality in 1 and aVL, as well as 2, 3, aVF   Outpatient Encounter Prescriptions as of 01/03/2013  Medication Sig  . acetaminophen (TYLENOL) 325 MG tablet Take 325 mg by mouth every 6 (six) hours as needed.   Marland Kitchen aspirin 81 MG tablet Take 81 mg by mouth daily.  Marland Kitchen atorvastatin (LIPITOR) 80 MG tablet Take 1 tablet (80 mg total) by mouth daily.  . Brimonidine Tartrate-Timolol (COMBIGAN OP) Apply to eye.  . carvedilol (COREG) 3.125 MG tablet Take 3.125 mg by mouth 2 (two) times daily with a meal.  . clopidogrel (PLAVIX) 75 MG tablet Take 75 mg by mouth daily.  . folic acid (FOLVITE) 400 MCG tablet Take 400 mcg by mouth daily.  . furosemide (LASIX) 20 MG tablet Take 20 mg by mouth daily.  Marland Kitchen glipiZIDE (GLUCOTROL XL) 2.5 MG 24 hr tablet Take 2.5 mg by mouth daily with breakfast.  . isosorbide mononitrate (IMDUR) 30 MG 24 hr tablet Take 1 tablet (30 mg total) by mouth daily.  Marland Kitchen lisinopril (PRINIVIL,ZESTRIL) 10 MG tablet Take 10 mg by mouth daily.   . magnesium oxide (MAG-OX) 400 MG tablet Take 400 mg by mouth 2 (two) times daily.   . nitroGLYCERIN (NITRODUR - DOSED IN MG/24 HR) 0.4 mg/hr Place 1 patch onto the skin daily.  Marland Kitchen oxyCODONE-acetaminophen (PERCOCET/ROXICET) 5-325 MG per tablet Take 1 tablet by mouth every 6 (six) hours as needed.   . potassium chloride (K-DUR) 10 MEQ tablet Take 10 mEq by  mouth 2 (two) times daily.  Marland Kitchen RANEXA 1000 MG SR tablet TAKE 1 TABLET (1,000 MG TOTAL) BY MOUTH 2 (TWO) TIMES DAILY. TAKE ONE TABLET BY MOUTH TWICE DAILY.  . ranolazine (RANEXA) 1000 MG SR tablet Take 1 tablet (1,000 mg total) by mouth 2 (two) times daily. Take one tablet by mouth twice daily.  . [DISCONTINUED] furosemide (LASIX) 40 MG tablet Take 40 mg by mouth daily.  . [DISCONTINUED] metFORMIN (GLUCOPHAGE) 500 MG tablet Take 500 mg by mouth 2 (two)  times daily with a meal.    Review of Systems  Constitutional: Negative.   HENT: Negative.   Eyes: Negative.   Respiratory: Negative.   Cardiovascular: Negative.   Gastrointestinal: Positive for abdominal pain.       Now resolved  Endocrine: Negative.   Musculoskeletal: Negative.   Skin: Negative.   Allergic/Immunologic: Negative.   Neurological: Negative.   Hematological: Negative.   Psychiatric/Behavioral: Negative.   All other systems reviewed and are negative.    BP 102/72  Pulse 72  Ht 5\' 1"  (1.549 m)  Wt 216 lb (97.977 kg)  BMI 40.83 kg/m2  Physical Exam  Nursing note and vitals reviewed. Constitutional: She is oriented to person, place, and time. She appears well-developed and well-nourished.  Obese  HENT:  Head: Normocephalic.  Nose: Nose normal.  Mouth/Throat: Oropharynx is clear and moist.  Eyes: Conjunctivae are normal. Pupils are equal, round, and reactive to light.  Neck: Normal range of motion. Neck supple. No JVD present.  Cardiovascular: Normal rate, regular rhythm, S1 normal, S2 normal, normal heart sounds and intact distal pulses.  Exam reveals no gallop and no friction rub.   No murmur heard. Pulmonary/Chest: Effort normal and breath sounds normal. No respiratory distress. She has no wheezes. She has no rales. She exhibits no tenderness.  Abdominal: Soft. Bowel sounds are normal. She exhibits no distension. There is no tenderness.  Musculoskeletal: Normal range of motion. She exhibits no edema and no tenderness.  Lymphadenopathy:    She has no cervical adenopathy.  Neurological: She is alert and oriented to person, place, and time. Coordination normal.  Skin: Skin is warm and dry. No rash noted. No erythema.  Psychiatric: She has a normal mood and affect. Her behavior is normal. Judgment and thought content normal.    Assessment and Plan

## 2013-01-03 NOTE — Patient Instructions (Signed)
You are doing well. No medication changes were made.  Please call us if you have new issues that need to be addressed before your next appt.  Your physician wants you to follow-up in: 6 months.  You will receive a reminder letter in the mail two months in advance. If you don't receive a letter, please call our office to schedule the follow-up appointment.   

## 2013-01-25 IMAGING — MG MM CAD SCREENING MAMMO
1 series · 4 of 4 positions shown · non-contrast
Comparison: none

REASON FOR EXAM: SCR MAMMO NO ORDER
COMMENTS:

PROCEDURE:     MAM - MAM DGTL SCRN MAM NO ORDER W/CAD  - December 17, 2011  [DATE]
RESULT:     No mass. No pathologic calcification. Benign calcification.
Stable nodular parenchymal pattern which is heterogeneously dense. CAD
evaluation nonfocal.

[R CC · right · 4 of 4 slices shown]
[im 1/4]
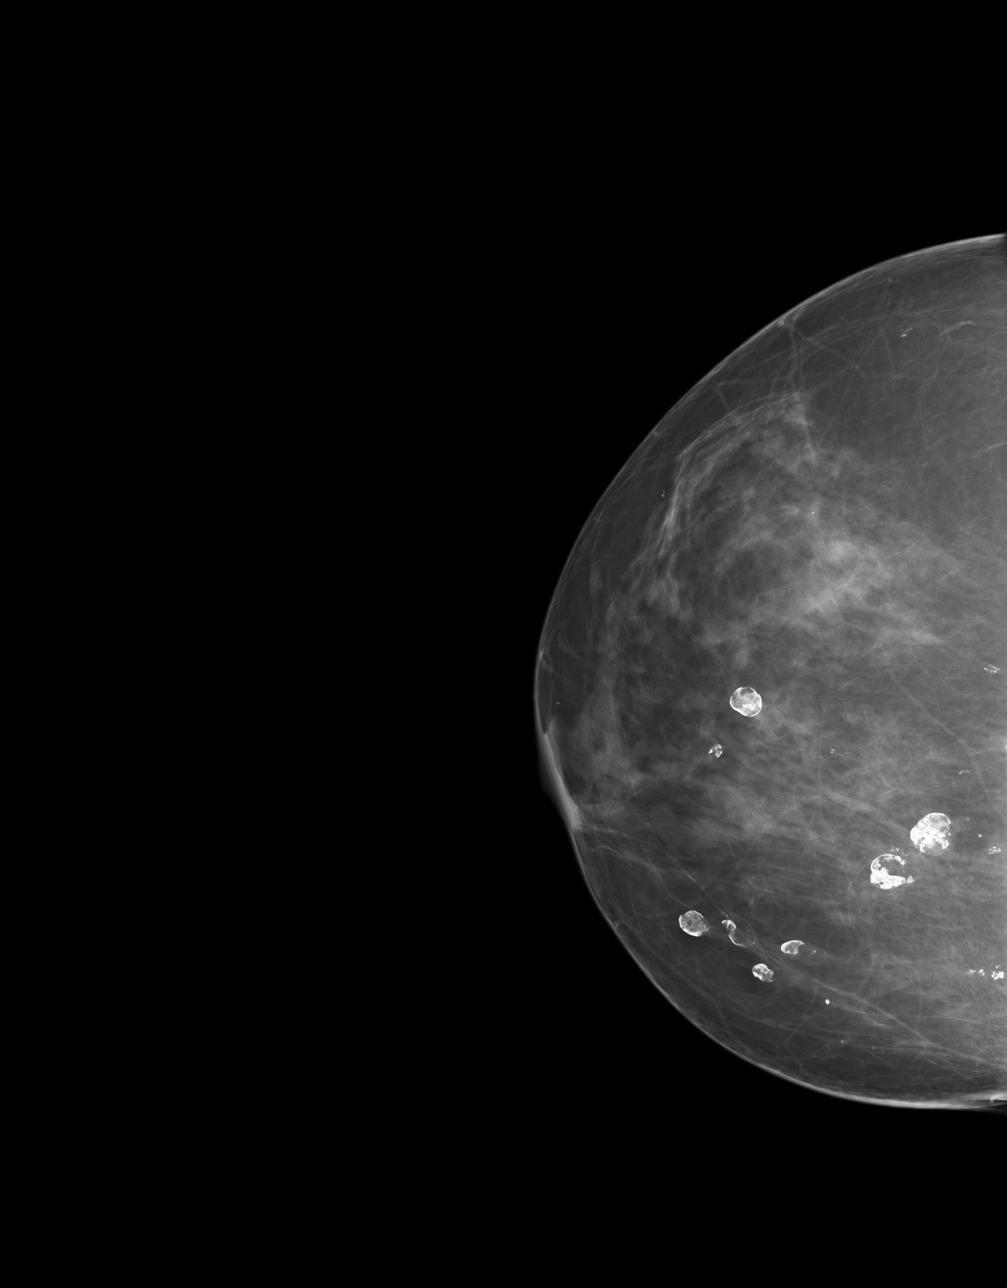
[im 2/4]
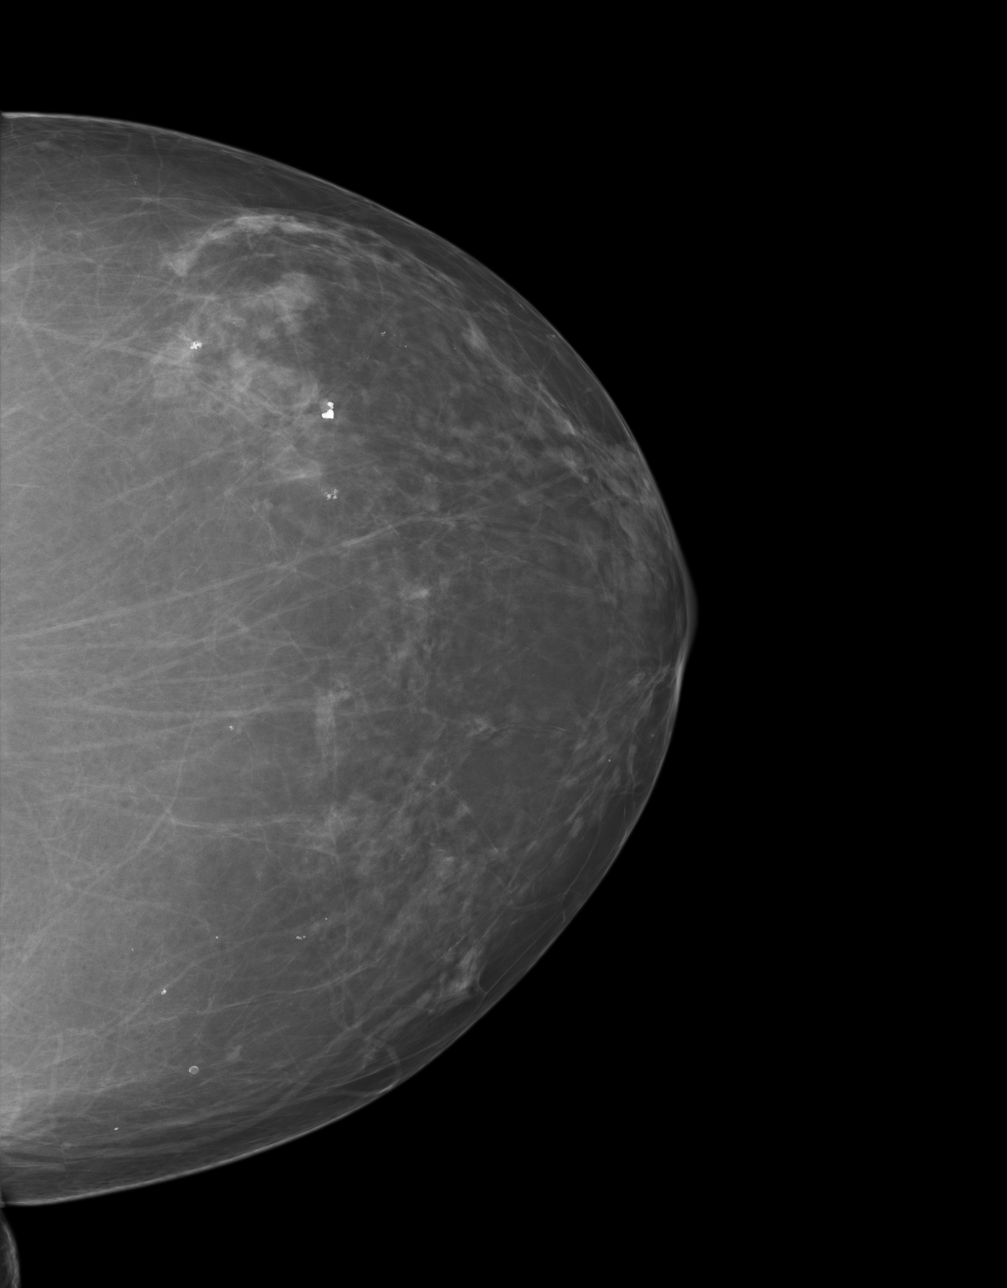
[im 3/4]
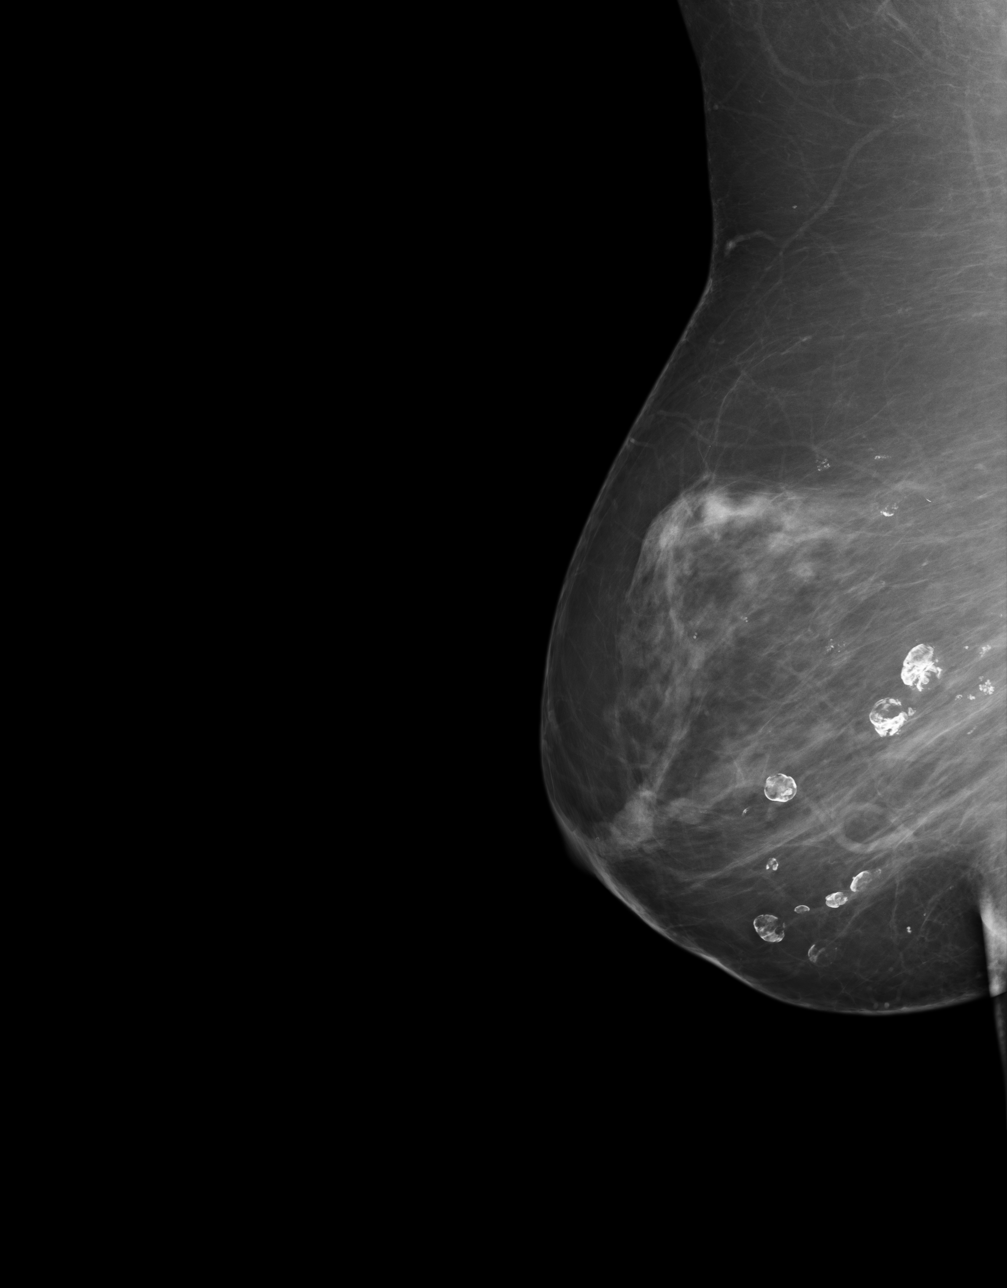
[im 4/4]
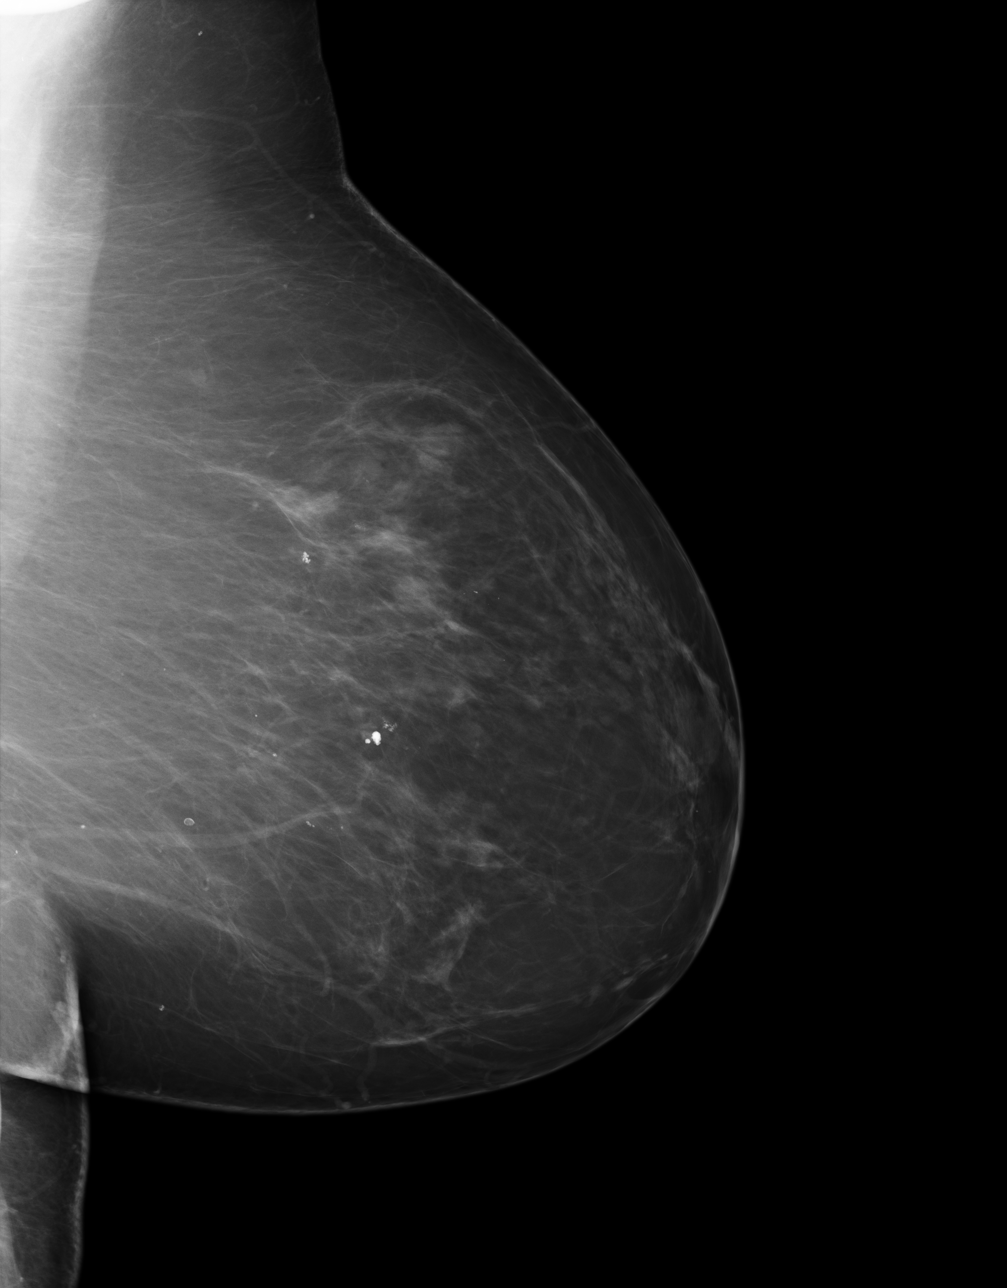

[4 of 4 positions shown; findings below may reference images not displayed]

IMPRESSION: Stable benign exam.

BI-RADS: Category 2- Benign Finding

A NEGATIVE MAMMOGRAM REPORT DOES NOT PRECLUDE BIOPSY OR OTHER EVALUATION OF
A CLINICALLY PALPABLE OR OTHERWISE SUSPICIOUS MASS OR LESION. BREAST CANCER
MAY NOT BE DETECTED IN UP TO 10% OF CASES.

## 2013-04-04 ENCOUNTER — Ambulatory Visit: Payer: Self-pay | Admitting: Family Medicine

## 2013-04-05 ENCOUNTER — Encounter: Payer: Self-pay | Admitting: Cardiovascular Disease

## 2013-04-05 ENCOUNTER — Ambulatory Visit (INDEPENDENT_AMBULATORY_CARE_PROVIDER_SITE_OTHER): Payer: Medicare PPO | Admitting: Cardiovascular Disease

## 2013-04-05 VITALS — BP 110/72 | HR 71 | Ht 61.0 in | Wt 218.5 lb

## 2013-04-05 DIAGNOSIS — I10 Essential (primary) hypertension: Secondary | ICD-10-CM

## 2013-04-05 DIAGNOSIS — E785 Hyperlipidemia, unspecified: Secondary | ICD-10-CM

## 2013-04-05 DIAGNOSIS — I25810 Atherosclerosis of coronary artery bypass graft(s) without angina pectoris: Secondary | ICD-10-CM

## 2013-04-05 DIAGNOSIS — E119 Type 2 diabetes mellitus without complications: Secondary | ICD-10-CM

## 2013-04-05 NOTE — Progress Notes (Signed)
Patient ID: Toni Marshall, female    DOB: 1945-05-13, 68 y.o.   MRN: [id number]  HPI Comments: Toni Marshall is a very pleasant 68 year old woman with history of coronary artery disease, bypass surgery in 2002, repeat bypass November 2012 at [facility], diabetes, hypertension, hyperlipidemia, obesity with non-STEMI 10/29/2011 with presentation to [facility]. She presents for routine followup. She is seen at the [facility] clinic.  In followup today, she reports that she is doing well. She denies having any significant chest pain. She has mild shortness of breath with heavy exertion, which is her baseline. She takes Lasix 3 days per week on average. Glucose levels running in the 140 range. Hemoglobin A1c last year 6.7 overall she has no new complaints Denies having any significant edema  Previous long complicated hospital course with acute respiratory failure, bibasilar pneumonia. She is on a ventilator and required prolonged resuscitation.  Catheterization in 10/30/2011 showed severe three-vessel coronary artery disease, patent vein graft to the RCA, occluded vein graft to the OM and LIMA to the LAD. Attempted LAD PCI which was unsuccessful.  Left main is moderately calcified, 99% mid LAD followed by a 90% lesion, 50% distal LAD disease, diagonal #2 had 90% ostial disease, 90% mid circumflex, proximal 60% RCA disease, LIMA graft with 100% stenosis at the graft ostium, saphenous vein graft to the OM with 100% ostial graft disease, vein graft to the distal RCA showing 30% proximal graft disease, 40% mid graft disease, ejection fraction 40%  Previous Echocardiogram showing ejection fraction 40-45%, apical akinesis with severe distal anterior and distal inferior wall hypokinesis, mild MR Recent echocardiogram in the hospital 09/16/2012 showed ejection fraction 50-55%, otherwise normal study  EKG shows normal sinus rhythm with rate 71 beats per minute with nonspecific ST abnormality in 1 and aVL, as well as  2, 3, aVF   Outpatient Encounter Prescriptions as of 04/05/2013  Medication Sig  . acetaminophen (TYLENOL) 325 MG tablet Take 325 mg by mouth every 6 (six) hours as needed.   Marland Kitchen aspirin 81 MG tablet Take 81 mg by mouth daily.  Marland Kitchen atorvastatin (LIPITOR) 80 MG tablet Take 1 tablet (80 mg total) by mouth daily.  . Brimonidine Tartrate-Timolol (COMBIGAN OP) Apply to eye.  . carvedilol (COREG) 3.125 MG tablet Take 3.125 mg by mouth 2 (two) times daily with a meal.  . clopidogrel (PLAVIX) 75 MG tablet Take 75 mg by mouth daily.  . folic acid (FOLVITE) 400 MCG tablet Take 400 mcg by mouth daily.  . furosemide (LASIX) 20 MG tablet Take 20 mg by mouth daily.  Marland Kitchen glipiZIDE (GLUCOTROL XL) 2.5 MG 24 hr tablet Take 2.5 mg by mouth daily with breakfast.  . isosorbide mononitrate (IMDUR) 30 MG 24 hr tablet Take 1 tablet (30 mg total) by mouth daily.  Marland Kitchen lisinopril (PRINIVIL,ZESTRIL) 10 MG tablet Take 10 mg by mouth daily.   . magnesium oxide (MAG-OX) 400 MG tablet Take 400 mg by mouth 2 (two) times daily.   . nitroGLYCERIN (NITRODUR - DOSED IN MG/24 HR) 0.4 mg/hr Place 1 patch onto the skin daily.  Marland Kitchen oxyCODONE-acetaminophen (PERCOCET/ROXICET) 5-325 MG per tablet Take 1 tablet by mouth every 6 (six) hours as needed.   . potassium chloride (K-DUR) 10 MEQ tablet Take 10 mEq by mouth 2 (two) times daily.  Marland Kitchen RANEXA 1000 MG SR tablet TAKE 1 TABLET (1,000 MG TOTAL) BY MOUTH 2 (TWO) TIMES DAILY. TAKE ONE TABLET BY MOUTH TWICE DAILY.  . [DISCONTINUED] ranolazine (RANEXA) 1000 MG SR  tablet Take 1 tablet (1,000 mg total) by mouth 2 (two) times daily. Take one tablet by mouth twice daily.    Review of Systems  Constitutional: Negative.   HENT: Negative.   Eyes: Negative.   Respiratory: Negative.   Cardiovascular: Negative.   Gastrointestinal: Positive for abdominal pain.       Now resolved  Endocrine: Negative.   Musculoskeletal: Negative.   Skin: Negative.   Allergic/Immunologic: Negative.   Neurological:  Negative.   Hematological: Negative.   Psychiatric/Behavioral: Negative.   All other systems reviewed and are negative.   BP 110/72  Pulse 71  Ht 5\' 1"  (1.549 m)  Wt 218 lb 8 oz (99.111 kg)  BMI 41.31 kg/m2  Physical Exam  Nursing note and vitals reviewed. Constitutional: She is oriented to person, place, and time. She appears well-developed and well-nourished.  Obese  HENT:  Head: Normocephalic.  Nose: Nose normal.  Mouth/Throat: Oropharynx is clear and moist.  Eyes: Conjunctivae are normal. Pupils are equal, round, and reactive to light.  Neck: Normal range of motion. Neck supple. No JVD present.  Cardiovascular: Normal rate, regular rhythm, S1 normal, S2 normal, normal heart sounds and intact distal pulses.  Exam reveals no gallop and no friction rub.   No murmur heard. Pulmonary/Chest: Effort normal and breath sounds normal. No respiratory distress. She has no wheezes. She has no rales. She exhibits no tenderness.  Abdominal: Soft. Bowel sounds are normal. She exhibits no distension. There is no tenderness.  Musculoskeletal: Normal range of motion. She exhibits no edema and no tenderness.  Lymphadenopathy:    She has no cervical adenopathy.  Neurological: She is alert and oriented to person, place, and time. Coordination normal.  Skin: Skin is warm and dry. No rash noted. No erythema.  Psychiatric: She has a normal mood and affect. Her behavior is normal. Judgment and thought content normal.    Assessment and Plan

## 2013-04-05 NOTE — Patient Instructions (Addendum)
You are doing well. No medication changes were made.  Please call us if you have new issues that need to be addressed before your next appt.  Your physician wants you to follow-up in: 3 months You will receive a reminder letter in the mail two months in advance. If you don't receive a letter, please call our office to schedule the follow-up appointment.   

## 2013-04-05 NOTE — Assessment & Plan Note (Signed)
We have encouraged her to stay on her generic Lipitor. Goal LDL less than 70

## 2013-04-05 NOTE — Assessment & Plan Note (Signed)
Currently with no symptoms of angina. No further workup at this time. Continue current medication regimen. 

## 2013-04-05 NOTE — Assessment & Plan Note (Signed)
We have encouraged continued exercise, careful diet management in an effort to lose weight. 

## 2013-04-05 NOTE — Assessment & Plan Note (Signed)
Blood pressure is well controlled on today's visit. No changes made to the medications. 

## 2013-04-14 ENCOUNTER — Ambulatory Visit: Payer: Self-pay | Admitting: Family Medicine

## 2013-06-01 ENCOUNTER — Other Ambulatory Visit: Payer: Self-pay | Admitting: Cardiovascular Disease

## 2013-07-04 ENCOUNTER — Encounter: Payer: Self-pay | Admitting: Cardiovascular Disease

## 2013-07-04 ENCOUNTER — Ambulatory Visit (INDEPENDENT_AMBULATORY_CARE_PROVIDER_SITE_OTHER): Payer: Medicare PPO | Admitting: Cardiovascular Disease

## 2013-07-04 VITALS — BP 102/70 | HR 73 | Ht 61.0 in | Wt 222.0 lb

## 2013-07-04 DIAGNOSIS — E119 Type 2 diabetes mellitus without complications: Secondary | ICD-10-CM

## 2013-07-04 DIAGNOSIS — I10 Essential (primary) hypertension: Secondary | ICD-10-CM

## 2013-07-04 DIAGNOSIS — I25810 Atherosclerosis of coronary artery bypass graft(s) without angina pectoris: Secondary | ICD-10-CM

## 2013-07-04 DIAGNOSIS — E785 Hyperlipidemia, unspecified: Secondary | ICD-10-CM

## 2013-07-04 DIAGNOSIS — R0602 Shortness of breath: Secondary | ICD-10-CM

## 2013-07-04 NOTE — Progress Notes (Signed)
Patient ID: Toni Marshall, female    DOB: November 18, 1945, 68 y.o.   MRN: [id number]  HPI Comments: Toni Marshall is a very pleasant 68 year old woman with history of coronary artery disease, bypass surgery in 2002, repeat bypass November 2012 at [facility], diabetes, hypertension, hyperlipidemia, obesity with non-STEMI 10/29/2011 with presentation to [facility]. She presents for routine followup. She is seen at the [facility] clinic. Her previous anginal symptoms included shortness of breath, possibly indigestion  In followup today, she reports that she is doing well. She denies having any significant chest pain.  Baseline mild shortness of breath with heavy exertion.  She takes Lasix 3 days per week on average.  she has no new complaints Denies having any significant edema, possibly trace swelling around the ankles  Previous long complicated hospital course with acute respiratory failure, bibasilar pneumonia. She is on a ventilator and required prolonged resuscitation.  Catheterization in 10/30/2011 showed severe three-vessel coronary artery disease, patent vein graft to the RCA, occluded vein graft to the OM and LIMA to the LAD. Attempted LAD PCI which was unsuccessful.  Left main is moderately calcified, 99% mid LAD followed by a 90% lesion, 50% distal LAD disease, diagonal #2 had 90% ostial disease, 90% mid circumflex, proximal 60% RCA disease, LIMA graft with 100% stenosis at the graft ostium, saphenous vein graft to the OM with 100% ostial graft disease, vein graft to the distal RCA showing 30% proximal graft disease, 40% mid graft disease, ejection fraction 40%  Previous Echocardiogram showing ejection fraction 40-45%, apical akinesis with severe distal anterior and distal inferior wall hypokinesis, mild MR Recent echocardiogram in the hospital 09/16/2012 showed ejection fraction 50-55%, otherwise normal study  EKG shows normal sinus rhythm with rate 73 beats per minute with nonspecific ST  abnormality in 1 and aVL   Outpatient Encounter Prescriptions as of 07/04/2013  Medication Sig  . acetaminophen (TYLENOL) 325 MG tablet Take 325 mg by mouth every 6 (six) hours as needed.   Marland Kitchen aspirin 81 MG tablet Take 81 mg by mouth daily.  Marland Kitchen atorvastatin (LIPITOR) 80 MG tablet Take 1 tablet (80 mg total) by mouth daily.  . Brimonidine Tartrate-Timolol (COMBIGAN OP) Apply to eye.  . carvedilol (COREG) 3.125 MG tablet Take 3.125 mg by mouth 2 (two) times daily with a meal.  . clopidogrel (PLAVIX) 75 MG tablet Take 75 mg by mouth daily.  . folic acid (FOLVITE) 400 MCG tablet Take 400 mcg by mouth daily.  . furosemide (LASIX) 20 MG tablet Take 20 mg by mouth daily.  Marland Kitchen glipiZIDE (GLUCOTROL XL) 2.5 MG 24 hr tablet Take 2.5 mg by mouth daily with breakfast.  . isosorbide mononitrate (IMDUR) 30 MG 24 hr tablet Take 1 tablet (30 mg total) by mouth daily.  Marland Kitchen lisinopril (PRINIVIL,ZESTRIL) 10 MG tablet Take 10 mg by mouth daily.   . magnesium oxide (MAG-OX) 400 MG tablet Take 400 mg by mouth 2 (two) times daily.   . nitroGLYCERIN (NITRODUR - DOSED IN MG/24 HR) 0.4 mg/hr Place 1 patch onto the skin daily.  . potassium chloride (K-DUR) 10 MEQ tablet Take 10 mEq by mouth 2 (two) times daily.  Marland Kitchen RANEXA 1000 MG SR tablet TAKE 1 TABLET (1,000 MG TOTAL) BY MOUTH 2 (TWO) TIMES DAILY. TAKE ONE TABLET BY MOUTH TWICE DAILY.    Review of Systems  Constitutional: Negative.   HENT: Negative.   Eyes: Negative.   Respiratory: Negative.   Cardiovascular: Negative.   Gastrointestinal: Positive for abdominal pain.  Now resolved  Endocrine: Negative.   Musculoskeletal: Negative.   Skin: Negative.   Allergic/Immunologic: Negative.   Neurological: Negative.   Hematological: Negative.   Psychiatric/Behavioral: Negative.   All other systems reviewed and are negative.  BP 102/70  Pulse 73  Ht 5\' 1"  (1.549 m)  Wt 222 lb (100.699 kg)  BMI 41.97 kg/m2  Physical Exam  Nursing note and vitals  reviewed. Constitutional: She is oriented to person, place, and time. She appears well-developed and well-nourished.  Obese  HENT:  Head: Normocephalic.  Nose: Nose normal.  Mouth/Throat: Oropharynx is clear and moist.  Eyes: Conjunctivae are normal. Pupils are equal, round, and reactive to light.  Neck: Normal range of motion. Neck supple. No JVD present.  Cardiovascular: Normal rate, regular rhythm, S1 normal, S2 normal, normal heart sounds and intact distal pulses.  Exam reveals no gallop and no friction rub.   No murmur heard. Pulmonary/Chest: Effort normal and breath sounds normal. No respiratory distress. She has no wheezes. She has no rales. She exhibits no tenderness.  Abdominal: Soft. Bowel sounds are normal. She exhibits no distension. There is no tenderness.  Musculoskeletal: Normal range of motion. She exhibits no edema and no tenderness.  Lymphadenopathy:    She has no cervical adenopathy.  Neurological: She is alert and oriented to person, place, and time. Coordination normal.  Skin: Skin is warm and dry. No rash noted. No erythema.  Psychiatric: She has a normal mood and affect. Her behavior is normal. Judgment and thought content normal.    Assessment and Plan

## 2013-07-04 NOTE — Patient Instructions (Addendum)
You are doing well. No medication changes were made.  Please call us if you have new issues that need to be addressed before your next appt.  Your physician wants you to follow-up in: 3 months You will receive a reminder letter in the mail two months in advance. If you don't receive a letter, please call our office to schedule the follow-up appointment.   

## 2013-07-04 NOTE — Assessment & Plan Note (Signed)
Cholesterol is at goal on the current lipid regimen. No changes to the medications were made.  

## 2013-07-04 NOTE — Assessment & Plan Note (Signed)
We have encouraged continued exercise, careful diet management in an effort to lose weight. 

## 2013-07-04 NOTE — Assessment & Plan Note (Signed)
Currently with no symptoms of angina. No further workup at this time. Continue current medication regimen. 

## 2013-07-04 NOTE — Assessment & Plan Note (Signed)
Blood pressure is well controlled on today's visit. No changes made to the medications. 

## 2013-09-05 ENCOUNTER — Other Ambulatory Visit: Payer: Self-pay | Admitting: Cardiovascular Disease

## 2013-09-27 ENCOUNTER — Other Ambulatory Visit: Payer: Self-pay | Admitting: Cardiovascular Disease

## 2013-10-03 ENCOUNTER — Other Ambulatory Visit: Payer: Self-pay | Admitting: Cardiovascular Disease

## 2013-10-06 ENCOUNTER — Ambulatory Visit (INDEPENDENT_AMBULATORY_CARE_PROVIDER_SITE_OTHER): Payer: Medicare PPO | Admitting: Cardiovascular Disease

## 2013-10-06 ENCOUNTER — Encounter: Payer: Self-pay | Admitting: Cardiovascular Disease

## 2013-10-06 VITALS — BP 110/70 | HR 66 | Ht 61.0 in | Wt 223.8 lb

## 2013-10-06 DIAGNOSIS — I25810 Atherosclerosis of coronary artery bypass graft(s) without angina pectoris: Secondary | ICD-10-CM

## 2013-10-06 DIAGNOSIS — R002 Palpitations: Secondary | ICD-10-CM

## 2013-10-06 DIAGNOSIS — E1159 Type 2 diabetes mellitus with other circulatory complications: Secondary | ICD-10-CM

## 2013-10-06 DIAGNOSIS — E785 Hyperlipidemia, unspecified: Secondary | ICD-10-CM

## 2013-10-06 DIAGNOSIS — I10 Essential (primary) hypertension: Secondary | ICD-10-CM

## 2013-10-06 MED ORDER — NITROGLYCERIN 0.4 MG/HR TD PT24: 0.4000 mg | MEDICATED_PATCH | Freq: Every day | TRANSDERMAL | Status: DC

## 2013-10-06 NOTE — Assessment & Plan Note (Signed)
We have encouraged continued exercise, careful diet management in an effort to lose weight. 

## 2013-10-06 NOTE — Patient Instructions (Signed)
You are doing well. No medication changes were made.  Please start exercise program  Please call us if you have new issues that need to be addressed before your next appt.  Your physician wants you to follow-up in: 6 months.  You will receive a reminder letter in the mail two months in advance. If you don't receive a letter, please call our office to schedule the follow-up appointment.

## 2013-10-06 NOTE — Assessment & Plan Note (Signed)
Currently with no symptoms of angina. No further workup at this time. Continue current medication regimen. 

## 2013-10-06 NOTE — Assessment & Plan Note (Signed)
Blood pressure is well controlled on today's visit. No changes made to the medications. 

## 2013-10-06 NOTE — Assessment & Plan Note (Signed)
Recommended she increase her exercise, follow a strict diet

## 2013-10-06 NOTE — Progress Notes (Signed)
Patient ID: Toni Marshall, female    DOB: 11/28/1945, 68 y.o.   MRN: [id number]  HPI Comments: Toni Marshall is a very pleasant 68 year old woman with history of coronary artery disease, bypass surgery in 2002, repeat bypass November 2012 at [facility], diabetes, hypertension, hyperlipidemia, obesity with non-STEMI 10/29/2011 with presentation to [facility]. She presents for routine followup. She is seen at the [facility] clinic. Her previous anginal symptoms included shortness of breath, possibly indigestion  In followup today, she reports that she is doing well. She denies having any significant chest pain.  Baseline mild shortness of breath  She takes Lasix every other day.  she has no new complaints. She has been relatively sedentary, weight is up 5 pounds from February 2015. She spends most of her day sitting inside in the air conditioning, knitting Denies having any significant edema  Total cholesterol less than 130. She's unclear where her sugars are running. No recent lab work available  Previous long complicated hospital course with acute respiratory failure, bibasilar pneumonia. She is on a ventilator and required prolonged resuscitation.  Catheterization in 10/30/2011 showed severe three-vessel coronary artery disease, patent vein graft to the RCA, occluded vein graft to the OM and LIMA to the LAD. Attempted LAD PCI which was unsuccessful.  Left main is moderately calcified, 99% mid LAD followed by a 90% lesion, 50% distal LAD disease, diagonal #2 had 90% ostial disease, 90% mid circumflex, proximal 60% RCA disease, LIMA graft with 100% stenosis at the graft ostium, saphenous vein graft to the OM with 100% ostial graft disease, vein graft to the distal RCA showing 30% proximal graft disease, 40% mid graft disease, ejection fraction 40%  Previous Echocardiogram showing ejection fraction 40-45%, apical akinesis with severe distal anterior and distal inferior wall hypokinesis, mild MR Recent  echocardiogram in the hospital 09/16/2012 showed ejection fraction 50-55%, otherwise normal study  EKG shows normal sinus rhythm with rate 66 beats per minute with nonspecific ST abnormality in 1 and aVL   Outpatient Encounter Prescriptions as of 10/06/2013  Medication Sig  . acetaminophen (TYLENOL) 325 MG tablet Take 325 mg by mouth every 6 (six) hours as needed.   Marland Kitchen aspirin 81 MG tablet Take 81 mg by mouth daily.  Marland Kitchen atorvastatin (LIPITOR) 80 MG tablet Take 1 tablet (80 mg total) by mouth daily.  . Brimonidine Tartrate-Timolol (COMBIGAN OP) Apply to eye 2 (two) times daily.   . carvedilol (COREG) 3.125 MG tablet Take 3.125 mg by mouth 2 (two) times daily with a meal.  . clopidogrel (PLAVIX) 75 MG tablet Take 75 mg by mouth daily.  . folic acid (FOLVITE) 400 MCG tablet Take 400 mcg by mouth daily.  . furosemide (LASIX) 20 MG tablet Take 20 mg by mouth every other day.   Marland Kitchen glipiZIDE (GLUCOTROL XL) 2.5 MG 24 hr tablet Take 2.5 mg by mouth daily with breakfast.  . isosorbide mononitrate (IMDUR) 30 MG 24 hr tablet Take 1 tablet (30 mg total) by mouth daily.  Marland Kitchen lisinopril (PRINIVIL,ZESTRIL) 10 MG tablet Take 10 mg by mouth daily.   . magnesium oxide (MAG-OX) 400 MG tablet Take 400 mg by mouth 2 (two) times daily.   . nitroGLYCERIN (NITRODUR - DOSED IN MG/24 HR) 0.4 mg/hr patch Place 1 patch (0.4 mg total) onto the skin daily.  . potassium chloride (K-DUR) 10 MEQ tablet Take 10 mEq by mouth 2 (two) times daily.  Marland Kitchen RANEXA 1000 MG SR tablet TAKE 1 TABLET TWICE A DAY  Review of Systems  Constitutional: Negative.   HENT: Negative.   Eyes: Negative.   Respiratory: Negative.   Cardiovascular: Negative.   Gastrointestinal: Positive for abdominal pain.       Now resolved  Endocrine: Negative.   Musculoskeletal: Negative.   Skin: Negative.   Allergic/Immunologic: Negative.   Neurological: Negative.   Hematological: Negative.   Psychiatric/Behavioral: Negative.   All other systems reviewed and  are negative.  BP 110/70  Pulse 66  Ht 5\' 1"  (1.549 m)  Wt 223 lb 12 oz (101.492 kg)  BMI 42.30 kg/m2  Physical Exam  Nursing note and vitals reviewed. Constitutional: She is oriented to person, place, and time. She appears well-developed and well-nourished.  Obese  HENT:  Head: Normocephalic.  Nose: Nose normal.  Mouth/Throat: Oropharynx is clear and moist.  Eyes: Conjunctivae are normal. Pupils are equal, round, and reactive to light.  Neck: Normal range of motion. Neck supple. No JVD present.  Cardiovascular: Normal rate, regular rhythm, S1 normal, S2 normal, normal heart sounds and intact distal pulses.  Exam reveals no gallop and no friction rub.   No murmur heard. Pulmonary/Chest: Effort normal and breath sounds normal. No respiratory distress. She has no wheezes. She has no rales. She exhibits no tenderness.  Abdominal: Soft. Bowel sounds are normal. She exhibits no distension. There is no tenderness.  Musculoskeletal: Normal range of motion. She exhibits no edema and no tenderness.  Lymphadenopathy:    She has no cervical adenopathy.  Neurological: She is alert and oriented to person, place, and time. Coordination normal.  Skin: Skin is warm and dry. No rash noted. No erythema.  Psychiatric: She has a normal mood and affect. Her behavior is normal. Judgment and thought content normal.    Assessment and Plan

## 2013-10-06 NOTE — Assessment & Plan Note (Signed)
Cholesterol is at goal on the current lipid regimen. No changes to the medications were made.  

## 2013-10-20 ENCOUNTER — Other Ambulatory Visit: Payer: Self-pay | Admitting: Cardiovascular Disease

## 2013-11-27 ENCOUNTER — Other Ambulatory Visit: Payer: Self-pay | Admitting: *Deleted

## 2013-11-27 MED ORDER — ATORVASTATIN CALCIUM 80 MG PO TABS: 80.0000 mg | ORAL_TABLET | Freq: Every day | ORAL | Status: DC

## 2013-12-28 ENCOUNTER — Telehealth: Payer: Self-pay | Admitting: Cardiovascular Disease

## 2013-12-28 NOTE — Telephone Encounter (Signed)
Notified patient samples available of ranexa 1000 mg

## 2013-12-28 NOTE — Telephone Encounter (Signed)
Pt would like some samples of Ranexa, please call back to let her know.

## 2014-01-09 ENCOUNTER — Encounter: Payer: Self-pay | Admitting: Cardiovascular Disease

## 2014-01-09 ENCOUNTER — Ambulatory Visit (INDEPENDENT_AMBULATORY_CARE_PROVIDER_SITE_OTHER): Payer: Medicare PPO | Admitting: Cardiovascular Disease

## 2014-01-09 VITALS — BP 82/50 | HR 70 | Ht 61.0 in | Wt 225.5 lb

## 2014-01-09 DIAGNOSIS — I951 Orthostatic hypotension: Secondary | ICD-10-CM

## 2014-01-09 DIAGNOSIS — I10 Essential (primary) hypertension: Secondary | ICD-10-CM

## 2014-01-09 DIAGNOSIS — I25810 Atherosclerosis of coronary artery bypass graft(s) without angina pectoris: Secondary | ICD-10-CM

## 2014-01-09 DIAGNOSIS — E785 Hyperlipidemia, unspecified: Secondary | ICD-10-CM

## 2014-01-09 DIAGNOSIS — E1159 Type 2 diabetes mellitus with other circulatory complications: Secondary | ICD-10-CM

## 2014-01-09 NOTE — Progress Notes (Signed)
Patient ID: Toni Marshall, female    DOB: 1946-02-21, 68 y.o.   MRN: [id number]  HPI Comments: Toni Marshall is a very pleasant 68 year old woman with history of coronary artery disease, bypass surgery in 2002, repeat bypass November 2012 at [facility], diabetes, hypertension, hyperlipidemia, obesity with non-STEMI 10/29/2011 with presentation to [facility]. She presents for routine followup of her coronary artery disease, bypass surgery, hypertension. She is seen at the [facility] clinic. Her previous anginal symptoms included shortness of breath, possibly indigestion  In followup today, she reports that she is doing well. She denies having any significant chest pain.  Continues to have problems with her weight. She denies any lightheadedness or dizziness. Blood pressure in the office today show systolic pressure 100. She denies any symptoms She takes Lasix every other day.but takes potassium daily  she has no new complaints. She has been relatively sedentary,   . She spends most of her day sitting inside in the air conditioning, knitting Denies having any significant edema  EKG done on today's visit shows normal sinus rhythm with rate 70 bpm, she has T-wave abnormality in 1 and aVL  Typically total cholesterol 130 Your lab work reviewed with her today showing glucose 156, creatinine 1.41, BUN 21  Other past medical history Previous long complicated hospital course with acute respiratory failure, bibasilar pneumonia. She is on a ventilator and required prolonged resuscitation.  Catheterization in 10/30/2011 showed severe three-vessel coronary artery disease, patent vein graft to the RCA, occluded vein graft to the OM and LIMA to the LAD. Attempted LAD PCI which was unsuccessful.  Left main is moderately calcified, 99% mid LAD followed by a 90% lesion, 50% distal LAD disease, diagonal #2 had 90% ostial disease, 90% mid circumflex, proximal 60% RCA disease, LIMA graft with 100% stenosis at the  graft ostium, saphenous vein graft to the OM with 100% ostial graft disease, vein graft to the distal RCA showing 30% proximal graft disease, 40% mid graft disease, ejection fraction 40%  Previous Echocardiogram showing ejection fraction 40-45%, apical akinesis with severe distal anterior and distal inferior wall hypokinesis, mild MR echocardiogram in the hospital 09/16/2012 showed ejection fraction 50-55%, otherwise normal study    Outpatient Encounter Prescriptions as of 01/09/2014  Medication Sig  . acetaminophen (TYLENOL) 325 MG tablet Take 325 mg by mouth every 6 (six) hours as needed.   Marland Kitchen aspirin 81 MG tablet Take 81 mg by mouth daily.  Marland Kitchen atorvastatin (LIPITOR) 80 MG tablet Take 1 tablet (80 mg total) by mouth daily.  . Brimonidine Tartrate-Timolol (COMBIGAN OP) Apply to eye 2 (two) times daily.   . carvedilol (COREG) 3.125 MG tablet Take 3.125 mg by mouth 2 (two) times daily with a meal.  . clopidogrel (PLAVIX) 75 MG tablet Take 75 mg by mouth daily.  . folic acid (FOLVITE) 400 MCG tablet Take 400 mcg by mouth daily.  . furosemide (LASIX) 20 MG tablet Take 20 mg by mouth every other day.   Marland Kitchen glipiZIDE (GLUCOTROL XL) 2.5 MG 24 hr tablet Take 2.5 mg by mouth daily with breakfast.  . isosorbide mononitrate (IMDUR) 30 MG 24 hr tablet TAKE 1 TABLET BY MOUTH EVERY DAY  . lisinopril (PRINIVIL,ZESTRIL) 10 MG tablet Take 5 mg by mouth daily.   . magnesium oxide (MAG-OX) 400 MG tablet Take 400 mg by mouth 2 (two) times daily.   . nitroGLYCERIN (NITRODUR - DOSED IN MG/24 HR) 0.4 mg/hr patch Place 1 patch (0.4 mg total) onto the skin daily.  Marland Kitchen  potassium chloride (K-DUR) 10 MEQ tablet Take 10 mEq by mouth daily.   Marland Kitchen RANEXA 1000 MG SR tablet TAKE 1 TABLET TWICE A DAY    Social history  reports that she has quit smoking. Her smoking use included Cigarettes. She has a 7.5 pack-year smoking history. She does not have any smokeless tobacco history on file. She reports that she does not drink alcohol  or use illicit drugs.  Review of Systems  Constitutional: Negative.   HENT: Negative.   Respiratory: Negative.   Cardiovascular: Negative.   Gastrointestinal: Negative.   Endocrine: Negative.   Musculoskeletal: Negative.   Skin: Negative.   Allergic/Immunologic: Negative.   Neurological: Negative.   Hematological: Negative.   Psychiatric/Behavioral: Negative.   All other systems reviewed and are negative.  BP 82/50 mmHg  Pulse 70  Ht 5\' 1"  (1.549 m)  Wt 225 lb 8 oz (102.286 kg)  BMI 42.63 kg/m2 Repeat blood pressure by myself 100 systolic Physical Exam  Constitutional: She is oriented to person, place, and time. She appears well-developed and well-nourished.  obese  HENT:  Head: Normocephalic.  Nose: Nose normal.  Mouth/Throat: Oropharynx is clear and moist.  Eyes: Conjunctivae are normal. Pupils are equal, round, and reactive to light.  Neck: Normal range of motion. Neck supple. No JVD present.  Cardiovascular: Normal rate, regular rhythm, S1 normal, S2 normal, normal heart sounds and intact distal pulses.  Exam reveals no gallop and no friction rub.   No murmur heard. Pulmonary/Chest: Effort normal and breath sounds normal. No respiratory distress. She has no wheezes. She has no rales. She exhibits no tenderness.  Abdominal: Soft. Bowel sounds are normal. She exhibits no distension. There is no tenderness.  Musculoskeletal: Normal range of motion. She exhibits no edema or tenderness.  Lymphadenopathy:    She has no cervical adenopathy.  Neurological: She is alert and oriented to person, place, and time. Coordination normal.  Skin: Skin is warm and dry. No rash noted. No erythema.  Psychiatric: She has a normal mood and affect. Her behavior is normal. Judgment and thought content normal.    Assessment and Plan  Nursing note and vitals reviewed.

## 2014-01-09 NOTE — Assessment & Plan Note (Signed)
We have encouraged continued exercise, careful diet management in an effort to lose weight. 

## 2014-01-09 NOTE — Assessment & Plan Note (Signed)
Currently with no symptoms of angina. No further workup at this time. Continue current medication regimen. 

## 2014-01-09 NOTE — Assessment & Plan Note (Signed)
Stressed the importance of diet control, weight loss

## 2014-01-09 NOTE — Patient Instructions (Addendum)
You are doing well. No medication changes were made.  Skip your next furosemide/lasix If you have dizziness, hold the lasix one time, increase fluids  Please call us if you have new issues that need to be addressed before your next appt.  Your physician wants you to follow-up in: 3 months.  You will receive a reminder letter in the mail two months in advance. If you don't receive a letter, please call our office to schedule the follow-up appointment.

## 2014-01-09 NOTE — Assessment & Plan Note (Signed)
Blood pressure running low on today's visit, up slightly on my recheck. She is asymptomatic. No changes made at this time. Recommended she skip a Lasix tomorrow and hold the Lasix doses periodically for any lightheaded spells concerning for orthostasis. Recommended she only take her potassium when she takes her Lasix

## 2014-01-09 NOTE — Assessment & Plan Note (Signed)
Encouraged her to stay on her statin. Previously, total cholesterol 130

## 2014-01-09 NOTE — Assessment & Plan Note (Signed)
Low blood pressure. If this continues to run low, we may need to cut her isosorbide in half or hold the Lasix and increase her fluids

## 2014-01-30 ENCOUNTER — Ambulatory Visit: Payer: Self-pay | Admitting: Family Medicine

## 2014-02-02 ENCOUNTER — Telehealth: Payer: Self-pay

## 2014-02-02 NOTE — Telephone Encounter (Signed)
Pt would like Ranexa samples.

## 2014-02-02 NOTE — Telephone Encounter (Signed)
Placed samples of Ranexa 1000 mg at front desk for pick up.

## 2014-04-02 ENCOUNTER — Telehealth: Payer: Self-pay

## 2014-04-02 NOTE — Telephone Encounter (Signed)
Spoke w/ pt.  She reports that she called EMS yesterday, as she felt badk.  Reports that her heart was ok, BP ok, and BG ok. She is unsure if she "is coming down w/ something, I sneezed a week ago". Denies chest pain, SOB, or n/v.  Pt wears her nitro patch daily and takes aspirin 81 mg. She states that she will take aspirin 325 mg if she "feel like it's my heart".  Advised pt to monitor BP and call back if sx recur before appt on 2/19.

## 2014-04-02 NOTE — Telephone Encounter (Signed)
Pt states she is having a dizzy spell that has been lasting for 2 days, denies any CP. Please call.

## 2014-04-03 ENCOUNTER — Ambulatory Visit: Payer: Self-pay | Admitting: Family Medicine

## 2014-04-05 ENCOUNTER — Inpatient Hospital Stay: Payer: Self-pay | Admitting: Internal Medicine

## 2014-04-05 DIAGNOSIS — I2510 Atherosclerotic heart disease of native coronary artery without angina pectoris: Secondary | ICD-10-CM

## 2014-04-05 DIAGNOSIS — E785 Hyperlipidemia, unspecified: Secondary | ICD-10-CM

## 2014-04-05 DIAGNOSIS — R42 Dizziness and giddiness: Secondary | ICD-10-CM

## 2014-04-05 DIAGNOSIS — R0789 Other chest pain: Secondary | ICD-10-CM

## 2014-04-05 DIAGNOSIS — I509 Heart failure, unspecified: Secondary | ICD-10-CM

## 2014-04-09 ENCOUNTER — Telehealth: Payer: Self-pay

## 2014-04-09 ENCOUNTER — Encounter: Payer: Self-pay | Admitting: Cardiovascular Disease

## 2014-04-09 NOTE — Telephone Encounter (Signed)
Patient contacted regarding discharge from [facility] on 04/06/14.  Patient understands to follow up with Dr. Mariah Milling on 04/13/14 at 10:45 at [facility]. Patient understands discharge instructions? yes Patient understands medications and regiment? yes Patient understands to bring all medications to this visit? yes  Pt reports that she still has a twinge of pain in her shoulder blade that was present throughout her hospitalization.  She would like to "see if we can't get rid of it altogether".

## 2014-04-12 ENCOUNTER — Telehealth: Payer: Self-pay | Admitting: Cardiovascular Disease

## 2014-04-12 NOTE — Telephone Encounter (Signed)
Patient called to say she continues to have back pain at the shoulder blade area (same pain as previously called about).  Patient states she may not make appt. For tomorrow as she may be back in the hospital if this pain continues.  Please call patient.

## 2014-04-13 ENCOUNTER — Encounter: Payer: Self-pay | Admitting: Cardiovascular Disease

## 2014-04-13 ENCOUNTER — Ambulatory Visit (INDEPENDENT_AMBULATORY_CARE_PROVIDER_SITE_OTHER): Payer: Medicare PPO | Admitting: Cardiovascular Disease

## 2014-04-13 VITALS — BP 110/72 | HR 72 | Ht 61.0 in | Wt 224.5 lb

## 2014-04-13 DIAGNOSIS — I25810 Atherosclerosis of coronary artery bypass graft(s) without angina pectoris: Secondary | ICD-10-CM

## 2014-04-13 DIAGNOSIS — E785 Hyperlipidemia, unspecified: Secondary | ICD-10-CM

## 2014-04-13 DIAGNOSIS — E1159 Type 2 diabetes mellitus with other circulatory complications: Secondary | ICD-10-CM

## 2014-04-13 DIAGNOSIS — M549 Dorsalgia, unspecified: Secondary | ICD-10-CM | POA: Insufficient documentation

## 2014-04-13 DIAGNOSIS — M5489 Other dorsalgia: Secondary | ICD-10-CM

## 2014-04-13 DIAGNOSIS — I10 Essential (primary) hypertension: Secondary | ICD-10-CM

## 2014-04-13 MED ORDER — TRAMADOL HCL 50 MG PO TABS: 50.0000 mg | ORAL_TABLET | Freq: Four times a day (QID) | ORAL | Status: DC | PRN

## 2014-04-13 MED ORDER — NAPROXEN 500 MG PO TABS: 500.0000 mg | ORAL_TABLET | Freq: Two times a day (BID) | ORAL | Status: DC

## 2014-04-13 NOTE — Patient Instructions (Addendum)
You are doing well.  For back and chest pain Take naproxen as needed up to twice a day Ok to take with tramadol up to every 6 hours  Please call us if you have new issues that need to be addressed before your next appt.  Your physician wants you to follow-up in: 3 months.  You will receive a reminder letter in the mail two months in advance. If you don't receive a letter, please call our office to schedule the follow-up appointment.

## 2014-04-13 NOTE — Assessment & Plan Note (Signed)
Blood pressure is well controlled on today's visit. No changes made to the medications. 

## 2014-04-13 NOTE — Progress Notes (Signed)
Patient ID: Toni Marshall, female    DOB: 1945-03-06, 69 y.o.   MRN: [id number]  HPI Comments: [name] is a very pleasant 69 year old woman with history of coronary artery disease, bypass surgery in 2002, repeat bypass November 2012 at [facility], diabetes, hypertension, hyperlipidemia, obesity with non-STEMI 10/29/2011 with presentation to [facility]. She presents for routine followup of her coronary artery disease, bypass surgery, hypertension. She is seen at the [facility] clinic. Her previous anginal symptoms included shortness of breath, possibly indigestion  In follow-up today, she presents after recent hospitalization 04/05/2014 for chest pain. She ruled out for MI, had stress test showing no significant ischemia. Significant artifact noted. Symptoms were felt to be somewhat atypical in nature Since her discharge on every 12 2016, she has been feeling well except for some back pain on the right around her scapula tender to palpation radiating around underneath her right breast. She does not have any medications that she takes at home She does have some pain on palpation today in the office in the mediastinal area Lab work in the hospital shows creatinine 1.78 BUN 35 Hospital records reviewed with her in detail EKG on today's visit shows normal sinus rhythm with rate 72 bpm, no significant ST or T-wave changes  Typically total cholesterol 130 Other past medical history Previous long complicated hospital course with acute respiratory failure, bibasilar pneumonia. She is on a ventilator and required prolonged resuscitation.  Catheterization in 10/30/2011 showed severe three-vessel coronary artery disease, patent vein graft to the RCA, occluded vein graft to the OM and LIMA to the LAD. Attempted LAD PCI which was unsuccessful.  Left main is moderately calcified, 99% mid LAD followed by a 90% lesion, 50% distal LAD disease, diagonal #2 had 90% ostial disease, 90% mid circumflex, proximal 60% RCA  disease, LIMA graft with 100% stenosis at the graft ostium, saphenous vein graft to the OM with 100% ostial graft disease, vein graft to the distal RCA showing 30% proximal graft disease, 40% mid graft disease, ejection fraction 40%  Previous Echocardiogram showing ejection fraction 40-45%, apical akinesis with severe distal anterior and distal inferior wall hypokinesis, mild MR echocardiogram in the hospital 09/16/2012 showed ejection fraction 50-55%, otherwise normal study   Allergies  Allergen Reactions  . Vancomycin     Hypotension & hypoxia     Outpatient Encounter Prescriptions as of 04/13/2014  Medication Sig  . acetaminophen (TYLENOL) 325 MG tablet Take 325 mg by mouth every 6 (six) hours as needed.   Marland Kitchen aspirin 81 MG tablet Take 81 mg by mouth daily.  Marland Kitchen atorvastatin (LIPITOR) 80 MG tablet Take 1 tablet (80 mg total) by mouth daily.  . Brimonidine Tartrate-Timolol (COMBIGAN OP) Apply to eye 2 (two) times daily.   . carvedilol (COREG) 3.125 MG tablet Take 3.125 mg by mouth 2 (two) times daily with a meal.  . clopidogrel (PLAVIX) 75 MG tablet Take 75 mg by mouth daily.  . folic acid (FOLVITE) 400 MCG tablet Take 400 mcg by mouth daily.  . furosemide (LASIX) 20 MG tablet Take 20 mg by mouth every other day.   Marland Kitchen glipiZIDE (GLUCOTROL XL) 2.5 MG 24 hr tablet Take 2.5 mg by mouth daily with breakfast.  . isosorbide mononitrate (IMDUR) 30 MG 24 hr tablet TAKE 1 TABLET BY MOUTH EVERY DAY  . lisinopril (PRINIVIL,ZESTRIL) 10 MG tablet Take 5 mg by mouth daily.   . magnesium oxide (MAG-OX) 400 MG tablet Take 400 mg by mouth 2 (two) times daily.   Marland Kitchen  meclizine (ANTIVERT) 25 MG tablet Take 25 mg by mouth 2 (two) times daily.   . nitroGLYCERIN (NITRODUR - DOSED IN MG/24 HR) 0.4 mg/hr patch Place 1 patch (0.4 mg total) onto the skin daily.  . potassium chloride (K-DUR) 10 MEQ tablet Take 10 mEq by mouth daily.   Marland Kitchen RANEXA 1000 MG SR tablet TAKE 1 TABLET TWICE A DAY  . naproxen (NAPROSYN) 500 MG  tablet Take 1 tablet (500 mg total) by mouth 2 (two) times daily with a meal.  . traMADol (ULTRAM) 50 MG tablet Take 1 tablet (50 mg total) by mouth every 6 (six) hours as needed.    Past Medical History  Diagnosis Date  . H/O acute respiratory failure   . CHF (congestive heart failure)     chronic systolic dysfunction with EF of 45%  . NSTEMI (non-ST elevated myocardial infarction)   . CAD (coronary artery disease)   . Hypertension   . Cardiogenic shock   . Hyperlipidemia   . Glaucoma   . Rheumatoid arthritis(714.0)   . Diabetes mellitus     Type II    Past Surgical History  Procedure Laterality Date  . Cardiac catheterization      x2 [facility]  . Cardiac catheterization      [facility]  . Coronary artery bypass graft  2012  . Coronary artery bypass graft  2002  . Eye surgery      left  . Colonoscopy      Social History  reports that she has quit smoking. Her smoking use included Cigarettes. She has a 7.5 pack-year smoking history. She does not have any smokeless tobacco history on file. She reports that she does not drink alcohol or use illicit drugs.  Family History family history includes Heart attack in her mother.  Review of Systems  Constitutional: Negative.   Respiratory: Negative.   Cardiovascular: Negative.   Gastrointestinal: Negative.   Musculoskeletal: Negative.   Skin: Negative.   Neurological: Negative.   Hematological: Negative.   Psychiatric/Behavioral: Negative.   All other systems reviewed and are negative.  BP 110/72 mmHg  Pulse 72  Ht 5\' 1"  (1.549 m)  Wt 224 lb 8 oz (101.833 kg)  BMI 42.44 kg/m2  Physical Exam  Constitutional: She is oriented to person, place, and time. She appears well-developed and well-nourished.  obese  HENT:  Head: Normocephalic.  Nose: Nose normal.  Mouth/Throat: Oropharynx is clear and moist.  Eyes: Conjunctivae are normal. Pupils are equal, round, and reactive to light.  Neck: Normal range of motion. Neck supple. No  JVD present.  Cardiovascular: Normal rate, regular rhythm, S1 normal, S2 normal, normal heart sounds and intact distal pulses.  Exam reveals no gallop and no friction rub.   No murmur heard. Pulmonary/Chest: Effort normal and breath sounds normal. No respiratory distress. She has no wheezes. She has no rales. She exhibits no tenderness.  Abdominal: Soft. Bowel sounds are normal. She exhibits no distension. There is no tenderness.  Musculoskeletal: Normal range of motion. She exhibits no edema or tenderness.  Lymphadenopathy:    She has no cervical adenopathy.  Neurological: She is alert and oriented to person, place, and time. Coordination normal.  Skin: Skin is warm and dry. No rash noted. No erythema.  Psychiatric: She has a normal mood and affect. Her behavior is normal. Judgment and thought content normal.    Assessment and Plan  Nursing note and vitals reviewed.

## 2014-04-13 NOTE — Assessment & Plan Note (Signed)
Currently with no symptoms of angina. No further workup at this time. Continue current medication regimen. Atypical chest pain on today's visit

## 2014-04-13 NOTE — Assessment & Plan Note (Signed)
Cholesterol is at goal on the current lipid regimen. No changes to the medications were made.  

## 2014-04-13 NOTE — Assessment & Plan Note (Signed)
We have encouraged continued exercise, careful diet management in an effort to lose weight. 

## 2014-04-13 NOTE — Assessment & Plan Note (Signed)
Atypical back pain likely musculoskeletal strain. We have given her a prescription for naproxen and if needed tramadol This pain radiates around underneath her right breast to the mediastinal area, reproducible with palpation

## 2014-04-19 ENCOUNTER — Inpatient Hospital Stay: Payer: Self-pay | Admitting: Internal Medicine

## 2014-04-20 DIAGNOSIS — I2510 Atherosclerotic heart disease of native coronary artery without angina pectoris: Secondary | ICD-10-CM

## 2014-04-20 DIAGNOSIS — R0600 Dyspnea, unspecified: Secondary | ICD-10-CM

## 2014-04-21 DIAGNOSIS — I5021 Acute systolic (congestive) heart failure: Secondary | ICD-10-CM

## 2014-04-25 ENCOUNTER — Telehealth: Payer: Self-pay

## 2014-04-25 NOTE — Telephone Encounter (Signed)
Patient contacted regarding discharge from [facility] on 04/23/14.  Patient understands to follow up with Dr. Mariah Milling on 05/03/14 at 10:45 at [facility]. Patient understands discharge instructions? yes Patient understands medications and regiment? yes Patient understands to bring all medications to this visit? yes

## 2014-05-03 ENCOUNTER — Ambulatory Visit (INDEPENDENT_AMBULATORY_CARE_PROVIDER_SITE_OTHER): Payer: Medicare PPO | Admitting: Cardiovascular Disease

## 2014-05-03 ENCOUNTER — Encounter: Payer: Self-pay | Admitting: Cardiovascular Disease

## 2014-05-03 VITALS — BP 90/60 | HR 67 | Ht 61.0 in | Wt 227.0 lb

## 2014-05-03 DIAGNOSIS — J439 Emphysema, unspecified: Secondary | ICD-10-CM

## 2014-05-03 DIAGNOSIS — I951 Orthostatic hypotension: Secondary | ICD-10-CM | POA: Diagnosis not present

## 2014-05-03 DIAGNOSIS — E1159 Type 2 diabetes mellitus with other circulatory complications: Secondary | ICD-10-CM

## 2014-05-03 DIAGNOSIS — I25810 Atherosclerosis of coronary artery bypass graft(s) without angina pectoris: Secondary | ICD-10-CM

## 2014-05-03 DIAGNOSIS — I10 Essential (primary) hypertension: Secondary | ICD-10-CM | POA: Diagnosis not present

## 2014-05-03 DIAGNOSIS — E785 Hyperlipidemia, unspecified: Secondary | ICD-10-CM

## 2014-05-03 DIAGNOSIS — J449 Chronic obstructive pulmonary disease, unspecified: Secondary | ICD-10-CM | POA: Insufficient documentation

## 2014-05-03 NOTE — Patient Instructions (Addendum)
You are doing well.  Ok to restart potassium every other day  Please call us if you have new issues that need to be addressed before your next appt.  Your physician wants you to follow-up in: 3 months.  You will receive a reminder letter in the mail two months in advance. If you don't receive a letter, please call our office to schedule the follow-up appointment.

## 2014-05-03 NOTE — Assessment & Plan Note (Signed)
Currently with no symptoms of angina. No further workup at this time. Continue current medication regimen. Recent stress test with no significant ischemia

## 2014-05-03 NOTE — Assessment & Plan Note (Signed)
Weight continues to be a major issue. Possibly contributing to sleep apnea. She might benefit from a outpatient sleep study given her continued issues with diastolic congestive heart failure

## 2014-05-03 NOTE — Assessment & Plan Note (Signed)
Encouraged her to stay on her Lipitor 

## 2014-05-03 NOTE — Assessment & Plan Note (Signed)
We have encouraged continued exercise, careful diet management in an effort to lose weight. 

## 2014-05-03 NOTE — Assessment & Plan Note (Signed)
Oxygen levels into the low 80s with ambulation while in the hospital even after aggressive diuresis. Started on oxygen at discharge

## 2014-05-03 NOTE — Assessment & Plan Note (Signed)
Blood pressure running low today. No medication changes made. She is asymptomatic. Suspect she is mildly dehydrated

## 2014-05-03 NOTE — Progress Notes (Signed)
Patient ID: Toni Marshall, female    DOB: 05/18/1945, 69 y.o.   MRN: [id number]  HPI Comments: Toni Marshall is a very pleasant 69 year old woman with history of coronary artery disease, bypass surgery in 2002, repeat bypass November 2012 at [facility], diabetes, hypertension, hyperlipidemia, obesity with non-STEMI 10/29/2011 with presentation to [facility].   She is seen at the [facility] clinic. Her previous anginal symptoms included shortness of breath, possibly indigestion Long history of smoking, 16 years of working in a [employer]  She presents today after recent discharge from the hospital admitted February 25 with discharge 04/23/2014. Admitted with shortness of breath. No significant improvement in symptoms with diuresis, climb in her creatinine and BUN. 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 She presents today for follow-up with 2-3 L oxygen. She has a generator at home, 2 tanks with 3 hours of oxygen each In general she feels better, no complaints, less shortness of breath. It was mentioned in the hospital at she consider a outpatient sleep study. She will talk with primary care. She denies any lightheadedness or dizziness. Blood pressure low this a.m. which she attributes to not eating breakfast, taking all her morning medications  Echocardiogram dated 04/05/2014 shows ejection fraction of 60-65%, diastolic relaxation abnormality, otherwise a normal study Cardiac stress test 04/06/2014 for atypical chest pain showed severe scar in the anterior and apical territory, ejection fraction 50%  EKG on today's visit shows normal sinus rhythm with rate 67 bpm, nonspecific T wave abnormality in leads 1 and aVL  Other past medical history  hospitalization 04/05/2014 for chest pain. She ruled out for MI, had stress test showing no significant ischemia. Significant artifact noted. Symptoms were  felt to be somewhat atypical in nature  Typically total cholesterol 130 Previous long complicated hospital course with acute respiratory failure, bibasilar pneumonia. She is on a ventilator and required prolonged resuscitation.  Catheterization in 10/30/2011 showed severe three-vessel coronary artery disease, patent vein graft to the RCA, occluded vein graft to the OM and LIMA to the LAD. Attempted LAD PCI which was unsuccessful.  Left main is moderately calcified, 99% mid LAD followed by a 90% lesion, 50% distal LAD disease, diagonal #2 had 90% ostial disease, 90% mid circumflex, proximal 60% RCA disease, LIMA graft with 100% stenosis at the graft ostium, saphenous vein graft to the OM with 100% ostial graft disease, vein graft to the distal RCA showing 30% proximal graft disease, 40% mid graft disease, ejection fraction 40%  Previous Echocardiogram showing ejection fraction 40-45%, apical akinesis with severe distal anterior and distal inferior wall hypokinesis, mild MR echocardiogram in the hospital 09/16/2012 showed ejection fraction 50-55%, otherwise normal study   Allergies  Allergen Reactions  . Vancomycin     Hypotension & hypoxia     Outpatient Encounter Prescriptions as of 05/03/2014  Medication Sig  . aspirin 81 MG tablet Take 81 mg by mouth daily.  Marland Kitchen atorvastatin (LIPITOR) 80 MG tablet Take 1 tablet (80 mg total) by mouth daily.  . Brimonidine Tartrate-Timolol (COMBIGAN OP) Apply to eye 2 (two) times daily.   Marland Kitchen CALCIUM PLUS VITAMIN D3 600-500 MG-UNIT CAPS Take 1 tablet by mouth 2 (two) times daily.   . carvedilol (COREG) 3.125 MG tablet Take 3.125 mg by mouth 2 (two) times daily with a meal.  . clopidogrel (PLAVIX) 75 MG tablet Take 75 mg by mouth daily.  . folic acid (  FOLVITE) 400 MCG tablet Take 400 mcg by mouth daily.  . furosemide (LASIX) 20 MG tablet Take 20 mg by mouth every other day.   Marland Kitchen glipiZIDE (GLUCOTROL XL) 2.5 MG 24 hr tablet Take 2.5 mg by mouth daily with  breakfast.  . isosorbide mononitrate (IMDUR) 30 MG 24 hr tablet TAKE 1 TABLET BY MOUTH EVERY DAY  . lisinopril (PRINIVIL,ZESTRIL) 10 MG tablet Take 5 mg by mouth daily.   . magnesium oxide (MAG-OX) 400 MG tablet Take 400 mg by mouth 2 (two) times daily.   . nitroGLYCERIN (NITRODUR - DOSED IN MG/24 HR) 0.4 mg/hr patch Place 1 patch (0.4 mg total) onto the skin daily.  Marland Kitchen RANEXA 1000 MG SR tablet TAKE 1 TABLET TWICE A DAY  . timolol (TIMOPTIC) 0.5 % ophthalmic solution   . [DISCONTINUED] acetaminophen (TYLENOL) 325 MG tablet Take 325 mg by mouth every 6 (six) hours as needed.   . [DISCONTINUED] naproxen (NAPROSYN) 500 MG tablet Take 1 tablet (500 mg total) by mouth 2 (two) times daily with a meal. (Patient not taking: Reported on 05/03/2014)  . [DISCONTINUED] potassium chloride (K-DUR) 10 MEQ tablet Take 10 mEq by mouth daily.   . [DISCONTINUED] traMADol (ULTRAM) 50 MG tablet Take 1 tablet (50 mg total) by mouth every 6 (six) hours as needed. (Patient not taking: Reported on 05/03/2014)    Past Medical History  Diagnosis Date  . H/O acute respiratory failure   . CHF (congestive heart failure)     chronic systolic dysfunction with EF of 45%  . NSTEMI (non-ST elevated myocardial infarction)   . CAD (coronary artery disease)   . Hypertension   . Cardiogenic shock   . Hyperlipidemia   . Glaucoma   . Rheumatoid arthritis(714.0)   . Diabetes mellitus     Type II    Past Surgical History  Procedure Laterality Date  . Cardiac catheterization      x2 [facility]  . Cardiac catheterization      [facility]  . Coronary artery bypass graft  2012  . Coronary artery bypass graft  2002  . Eye surgery      left  . Colonoscopy      Social History  reports that she has quit smoking. Her smoking use included Cigarettes. She has a 7.5 pack-year smoking history. She does not have any smokeless tobacco history on file. She reports that she does not drink alcohol or use illicit drugs.  Family History family  history includes Heart attack in her mother; Stroke in her sister.  Review of Systems  Constitutional: Negative.   Respiratory: Negative.   Cardiovascular: Negative.   Gastrointestinal: Negative.   Musculoskeletal: Negative.   Skin: Negative.   Neurological: Negative.   Hematological: Negative.   Psychiatric/Behavioral: Negative.   All other systems reviewed and are negative.  BP 90/60 mmHg  Pulse 67  Ht 5\' 1"  (1.549 m)  Wt 227 lb (102.967 kg)  BMI 42.91 kg/m2  Physical Exam  Constitutional: She is oriented to person, place, and time. She appears well-developed and well-nourished.  obese  HENT:  Head: Normocephalic.  Nose: Nose normal.  Mouth/Throat: Oropharynx is clear and moist.  Eyes: Conjunctivae are normal. Pupils are equal, round, and reactive to light.  Neck: Normal range of motion. Neck supple. No JVD present.  Cardiovascular: Normal rate, regular rhythm, S1 normal, S2 normal, normal heart sounds and intact distal pulses.  Exam reveals no gallop and no friction rub.   No murmur heard. Pulmonary/Chest: Effort normal  and breath sounds normal. No respiratory distress. She has no wheezes. She has no rales. She exhibits no tenderness.  Abdominal: Soft. Bowel sounds are normal. She exhibits no distension. There is no tenderness.  Musculoskeletal: Normal range of motion. She exhibits no edema or tenderness.  Lymphadenopathy:    She has no cervical adenopathy.  Neurological: She is alert and oriented to person, place, and time. Coordination normal.  Skin: Skin is warm and dry. No rash noted. No erythema.  Psychiatric: She has a normal mood and affect. Her behavior is normal. Judgment and thought content normal.    Assessment and Plan  Nursing note and vitals reviewed.

## 2014-05-08 ENCOUNTER — Ambulatory Visit: Payer: Self-pay | Admitting: Family

## 2014-05-09 ENCOUNTER — Telehealth: Payer: Self-pay | Admitting: *Deleted

## 2014-05-09 NOTE — Telephone Encounter (Signed)
Spoke w/ pt.  Advised her that her pulmonologist will have to order her O2.  She reports that Christoper Allegra just told her to call her doctor, so she was unsure of which one.  She states that she will call her PCP and let us know if we can be of any further assistance in the matter.

## 2014-05-09 NOTE — Telephone Encounter (Signed)
Pt needs doctor to redo an order for the pt to get oxygen.  Has two tanks with her but does not have the key to turn them on They won't give it until we send in the orders [facility] in [place]   .

## 2014-05-29 ENCOUNTER — Ambulatory Visit
Admit: 2014-05-29 | Disposition: A | Discharge status: Home / Self Care | Payer: Self-pay | Attending: Family Medicine | Admitting: Family Medicine

## 2014-06-01 ENCOUNTER — Ambulatory Visit: Admit: 2014-06-01 | Disposition: A | Discharge status: Home / Self Care | Payer: Self-pay | Admitting: Family Medicine

## 2014-06-04 ENCOUNTER — Ambulatory Visit
Admit: 2014-06-04 | Disposition: A | Discharge status: Home / Self Care | Payer: Self-pay | Attending: Family | Admitting: Family

## 2014-06-04 ENCOUNTER — Telehealth: Payer: Self-pay

## 2014-06-04 NOTE — Telephone Encounter (Signed)
Ranexa Samples at front desk for pick up.

## 2014-06-04 NOTE — Telephone Encounter (Signed)
Pt would like Ranexa samples. Will stop by this a.m. Around 10

## 2014-06-08 ENCOUNTER — Other Ambulatory Visit: Payer: Self-pay | Admitting: Family Medicine

## 2014-06-08 DIAGNOSIS — N6320 Unspecified lump in the left breast, unspecified quadrant: Secondary | ICD-10-CM

## 2014-06-12 NOTE — Consult Note (Signed)
PATIENT NAME:  Toni [facility], Toni Marshall MR#:  [id number] DATE OF BIRTH:  May 04, 1945  DATE OF CONSULTATION:  01/28/2012  REFERRING PHYSICIAN:  Prime [facility]  CONSULTING PHYSICIAN:  Toni [facility] III, MD  PRIMARY CARE PHYSICIAN: Toni Aldo, MD   CARDIOLOGIST: Toni Nordmann, MD   CHIEF COMPLAINT: Abdominal chest pain.   BRIEF HISTORY: Toni [facility] is a 69 year old woman with significant coronary artery disease who has undergone two previous coronary artery bypass procedures. She had a non-Q-wave MI in September 2013 which evolved into a significant episode of congestive heart failure with requirement of ventilator support. She underwent catheterization at that time and was felt to have nonsurgical disease, was treated with medications and improved over the length of her hospitalization, finally being discharged at the [facility] of September. She presents with complaints of continued right flank, right back pain, and right upper quadrant pain. She's had this pain in the past which she felt was similar to her pain that was associated with her myocardial infarctions. She had some mild nausea but did not develop any vomiting. She felt lightheaded and dizzy and presented to the Emergency Room. It was felt she may have exacerbation of her coronary artery disease. She did have a mildly elevated troponin at that time. She is on Plavix and aspirin for her coronary artery disease. Because her symptoms are primarily right upper quadrant and because she did admit to some dietary relationship to her symptoms, she underwent a gallbladder ultrasound which did demonstrate multiple stones without evidence of acute cholecystitis. Surgical service was consulted.   She denies any history of hepatitis, yellow jaundice, pancreatitis, peptic ulcer disease, previous diagnosis of gallbladder disease, or diverticulitis. Only previous surgery is her myocardial bypass. She has not had any previous abdominal surgery. She does have history of diabetes,  hypertension, and hyperlipidemia in addition to her coronary artery disease.   MEDICATIONS: Well outlined in her current note.   FAMILY HISTORY: Noncontributory.   SOCIAL HISTORY: The patient does has no history of smoking or alcohol usage.   REVIEW OF SYSTEMS: Outlined with the patient and unremarkable other than the symptoms noted above.   PHYSICAL EXAMINATION:   VITAL SIGNS: Stable at the present time. Blood pressure 138/80, heart rate 60 and regular, room air oxygen saturation 97%, temperature 92.   A complete exam was declined by the patient since she was eating lunch.   IMPRESSION: This woman denies any significant symptoms at the present time. She does have evidence of symptomatic biliary tract disease. I would suggest that her current and previous symptoms have been related to her biliary tract disease with identified gallstones. However, she is currently on significant antiplatelet drugs and less than six months from myocardial infarction. I think at this point we will simply follow her along and treat her symptomatically using antibiotics as we felt she was developing acute cholecystitis. I would not recommend surgical intervention and she would need to be off antiplatelet drugs prior to surgical intervention being considered. I would also like to see her out several more months from her coronary artery event unless urgent or emergent symptoms develop.   I discussed this plan with her in detail and she is very comfortable with that option. We will be available to assist as necessary. Appreciate the opportunity to be of service.   ____________________________ Toni End, MD rle:drc D: 01/28/2012 15:14:23 ET T: 01/28/2012 15:29:06 ET JOB#: [id number]  cc: Toni End, MD, <Dictator> Toni "Sallie" Allena Katz, MD Toni [facility]  Toni Mainland, MD Toni Ore MD ELECTRONICALLY SIGNED 01/28/2012 17:11

## 2014-06-12 NOTE — Consult Note (Signed)
Brief Consult Note: Diagnosis: NSTEMI.   Patient was seen by consultant.   Orders entered.   Comments: Continue Heparin.  Echo  Urgent cath today.  Electronic Signatures: Lorine Bears (MD)  (Signed 06-Sep-13 08:32)  Authored: Brief Consult Note   Last Updated: 06-Sep-13 08:32 by Lorine Bears (MD)

## 2014-06-12 NOTE — Discharge Summary (Signed)
PATIENT NAME:  Toni [facility], Toni Marshall MR#:  [id number] DATE OF BIRTH:  October 01, 1945  DATE OF ADMISSION:  01/28/2012 DATE OF DISCHARGE:  01/29/2012  ADMITTING DIAGNOSIS: Chest pain.   DISCHARGE DIAGNOSES:  1. Right posterior chest pain, possible unstable angina with stable electrocardiogram and minimal elevation of troponin. No cardiac injury. 2. Hypertension. 3. Hyperlipidemia.  4. Congestive heart failure, acute on chronic, systolic. 5. Gallstone, asymptomatic.    DISCHARGE CONDITION: Stable.   DISCHARGE MEDICATIONS: Patient is to resume her outpatient medications which are:  1. Aspirin 81 mg p.o. daily.  2. Folic acid 1 tablet once daily. 3. Atorvastatin 40 mg p.o. at bedtime.  4. Magnesium oxide 400 mg p.o. twice daily. 5. Klor-Con 10 mEq p.o. twice daily. 6. Acetaminophen 325 mg 2 tablets every four hours as needed.  7. Clopidogril 75 mg p.o. daily.  8. Carvedilol 3.125 mg twice daily. 9. Furosemide 40 mg p.o. daily. 10. Lisinopril 10 mg p.o. daily.  11. Metformin 500 mg p.o. twice daily.  12. Ranexa 1 gram twice daily.  13. Combivent 0.2/0.5% one drop to left eye twice a day. 14. Imdur 30 mg p.o. daily.   HOME OXYGEN: None.   DIET: 2 grams salt, low fat, low cholesterol, carbohydrate controlled diet, regular consistency.   ACTIVITY LIMITATIONS: As tolerated.    FOLLOW UP: Follow-up appointment with Dr. Maryruth Hancock in two days after discharge as well as Dr. Mariah Milling or Dr. Kirke Corin in one week after discharge.   CONSULTANTS:  1. Dr. Kirke Corin. 2. Dr. Michela Pitcher.   HISTORY OF PRESENT ILLNESS: Patient is a 69 year old African American female with past medical history significant for history of non-Q-wave myocardial infarction in the recent past who presented back to the hospital with complaints of right posterior aspect chest pain. Please refer to Dr. Arlys John admission note on 01/28/2012. On arrival to the Emergency Room patient's vitals: Temperature 97.9, pulse 70, respirations 20, blood  pressure 134/80, saturation was 98% on room air. Physical exam revealed some crackles on posterior aspect of right lung. Otherwise all physical examination was unremarkable.   LABORATORY, DIAGNOSTIC AND RADIOLOGICAL DATA: Chest x-ray, portable single view 01/28/2012 revealed mild interstitial edema. Ultrasound of abdomen, limited survey, 01/29/2012 showed cholelithiasis as well as sonographic evidence of acute cholecystitis.   Patient's EKG was unremarkable. It showed sinus rhythm at 73 beats per minute. T wave abnormality possible lateral ischemia but otherwise unremarkable EKG and nothing changed since prior EKG. Patient's lab data revealed mild elevation of BUN and creatinine to 29 and 1.35, glucose 119, bicarbonate was low at 20, estimated GFR for African American would be 48. Lipase level was low at 60. Liver enzymes were normal. Troponin was slightly elevated at 0.09, however, CK-MB fraction and CK total were abnormal and troponin was declining on the second set, it was again 0.9 and on the third set 0.08 CBC was within normal limits and no left shift was noted. Patient's urinalysis revealed yellow hazy urine, negative for glucose, bilirubin or ketones, specific gravity 1.026, pH 7.0, negative for blood, 30 mg/dL protein, negative for nitrites, 1+ leukocyte esterase, 2 red blood cells, 9 white blood cells, 1+ bacteria, 5 to 15 red cells, mucous was present as well as hyaline casts.   HOSPITAL COURSE: Patient was admitted to the hospital. Her cardiac enzymes were cycled and consultation with Dr. Kirke Corin was obtained. Dr. Kirke Corin followed patient along and felt that patient had atypical angina with known history of coronary artery disease and gallstones as well as hypertension which  was well controlled. Dr. Kirke Corin admitted, that patient's  troponin level remained flat with no elevation in CPK, patient had previous unsuccessful revascularization of LAD. He recommended to continue medical therapy, ambulate  patient and if there is no significant exertional symptoms he recommended to discharge patient home from cardiac standpoint and continue antianginal medications such as Ranexa and long acting nitroglycerin can be considered if blood pressure allows and follow up with Dr. Mariah Milling in the next few weeks after discharge. Patient was ambulated she did not have any significant discomfort. She is being discharged home. She is to start Imdur and follow up with Dr. Mariah Milling in the next few weeks after discharge.   In regards to congestive heart failure, left heart, acute on chronic, systolic, patient was given one dose of Lasix IV after which she diuresed and overall she lost approximately 1 liter of fluids after which she felt much more comfortable. I felt that patient's chest pain could have been related to some fluid retention in pleural space and discomfort due to congestive heart failure. It is recommended to follow patient and advance patient's Lasix if needed. Patient otherwise is to continue ACE inhibitor as well as Lasix and follow up with Dr. Mariah Milling for further recommendations.   In regards to gallstones, as mentioned above Dr. Michela Pitcher saw patient in consultation and followed patient along. Dr. Michela Pitcher discussed with patient options. She did not want to consider surgery at this time and he agreed and that was right decision in light of recent MI and use of antiplatelet drugs. Dr. Michela Pitcher recommended for patient to be off Plavix and probably six months out from her previous MI before considering surgery. Patient is being discharged home in with above-mentioned medications and follow up in stable condition. Her vital signs on the day of discharge: Temperature was 98.3, pulse 85, respiration rate 18, blood pressure 109/74, saturation was 96% on room air at rest. Of note, after patient was discharged, she was noted to have urine cultures which came back positive for enterococcus aerogenes more than 100,000 colony forming units,  intermittent resistance to nitrofurantoin, resistant to cefazolin and cefoxitin and sensitive to ceftriaxone, ciprofloxacin, gentamicin, imipenem as well as trimethoprim sulfamethoxazole and levofloxacin. Patient had no symptomatology, however, it is recommended to follow her as outpatient in the clinic and make decisions about initiation of antibiotic if necessary.   TIME SPENT: 40 minutes.  ____________________________ Katharina Caper, MD rv:cms D: 01/30/2012 12:08:39 ET T: 01/30/2012 13:05:47 ET  JOB#: [id number] cc: Katharina Caper, MD, <Dictator> Sarah "Sallie" Allena Katz, MD  Sarena Jezek MD ELECTRONICALLY SIGNED 02/06/2012 19:42

## 2014-06-12 NOTE — H&P (Signed)
PATIENT NAME:  Toni [facility], Toni Marshall MR#:  [id number] DATE OF BIRTH:  09/02/1945  DATE OF ADMISSION:  10/29/2011   PRIMARY CARE PHYSICIAN: [facility]   History was obtained from the patient and her niece at bedside. Case discussed with Dr. Cyril Loosen of the Emergency Room, also discussed with Dr. Mariah Milling of Cardiology. Old records have been reviewed. The patient's imaging studies and EKGs from today and previously reviewed personally.   CHIEF COMPLAINT: Vague back pain radiating to chest since yesterday night.   HISTORY OF PRESENTING ILLNESS: The patient is a 69 year old female patient with coronary artery disease, CABG x2, diabetes mellitus, and hypertension who presents to the Emergency Room complaining of one day of vague back pain radiating to the chest. The patient had cardiac enzymes done which showed elevated troponin greater than 40 with CK of 1847 with CK-MB of 146. EKG shows new T wave inversions in lateral leads including the V2, V3, V4, V5, and V6 which are new compared to her prior EKG from 2010. The patient had her last CABG in November of 2012 at [facility].   The patient has vague back pain radiating to the chest which is similar to her prior heart attack. She does not complain of any shortness of breath, palpitations, or lower extremity swelling. She does have on and off nausea but no vomiting. No abdominal pain or diarrhea. Has been taking all her medications as prescribed. She has not had a stress test since her CABG. Blood pressure and heart rate are stable at this time. Case has been discussed with Dr. Mariah Milling who has suggested starting patient on heparin drip and aspirin, no Plavix, and admitting the patient to CCU for close monitoring as the patient is at high risk for deterioration with her history of CABG and presently NSTEMI with severely elevated cardiac enzymes.    PAST MEDICAL HISTORY:  1. Hypertension. 2. Hyperlipidemia. 3. Type II diabetes mellitus. 4. Rheumatoid  arthritis. 5. Coronary artery disease status post coronary artery bypass graft in 2002 and 2012.   PAST SURGICAL HISTORY: Cardiac catheterization and coronary artery bypass graft in 2002 and 2012.   ALLERGIES: No known drug allergies.   HOME MEDICATIONS:  1. Aspirin 81 mg oral once a day.  2. Atorvastatin 40 mg oral once a day.  3. Folic acid 1 tablet orally once a day.  4. Isosorbide mononitrate 15 mg orally once a day.  5. Potassium chloride 10 mEq orally once a day.  6. Lasix 20 mg oral 2 times a day.  7. Lisinopril 10 mg oral once a day.  8. Magnesium oxide 400 mg oral 2 times a day.  9. Metformin 500 mg oral 2 times a day.  10. Metoprolol tartrate 25 mg oral 2 times a day.  11. Protonix 40 mg oral once a day.   FAMILY HISTORY: Mother deceased at age 20 secondary to MI. Father deceased in his 51's, had history of diabetes mellitus.   SOCIAL HISTORY: Does not smoke. No alcohol. No illicit drugs. The patient lives in [place], [place].   CODE STATUS: FULL CODE.   REVIEW OF SYSTEMS: CONSTITUTIONAL: No fever, fatigue, weakness. EYES: No blurred vision, pain, redness. ENT: No tinnitus, ear pain, hearing loss. RESPIRATORY: No cough, wheeze, hemoptysis. CARDIOVASCULAR: Complains of chest pain, back pain. No orthopnea, edema. GI: No nausea, vomiting, diarrhea, abdominal pain. GENITOURINARY: No CVA, dysuria, hematuria, or frequency. ENDOCRINE: No polyuria, nocturia, thyroid problems. HEMATOLOGIC/LYMPHATIC: No anemia, easy bruising or bleeding. SKIN: No acne, rash,  rash, lesions. MUSCULOSKELETAL: Has back pain. Some rheumatoid arthritic pain. NEUROLOGIC: No numbness, weakness, dysarthria. PSYCHIATRIC: No anxiety or depression.      PHYSICAL EXAMINATION:   VITAL SIGNS: Temperature 98, pulse 75, respirations 16, blood pressure 124/68, saturating 96% on room air.   GENERAL: Obese African American female patient lying in bed comfortable, conversational, cooperative with exam.    PSYCHIATRIC: Alert and oriented x3. Mood and affect appropriate. Judgment intact.   HEENT: Atraumatic, normocephalic. Oral mucosa moist and pink. External ears and nose normal. No pallor. No icterus. Pupils bilaterally equal and reactive to light.   NECK: Supple. No thyromegaly. No palpable lymph nodes. Trachea midline. No carotid bruit or JVD.   CARDIOVASCULAR: S1, S2, regular rate and rhythm without any murmurs. Peripheral pulses 2+. No edema. Has a scar from prior CABG and has tenderness around it with some hypertrophic scarring.   RESPIRATORY: Normal work of breathing. Clear to auscultation on both sides.   GI: Soft abdomen, nontender. Bowel sounds present. No hepatosplenomegaly palpable.   SKIN: Warm and dry. No petechiae, rash, ulcers.   MUSCULOSKELETAL: No joint swelling, redness, effusion of the large joints. Normal muscle tone.   NEUROLOGICAL: Motor strength 5/5 in upper and lower extremities. Sensation to fine touch intact all over.   LYMPHATIC: No cervical lymphadenopathy.   LABORATORY, DIAGNOSTIC, AND RADIOLOGICAL DATA: Lab studies show BUN of 37, creatinine 1.35, GFR 41. CK of 1847 with CK-MB 146.7, and troponin greater than 40. WBC 8.2, hemoglobin 13, platelets 291. INR 1.1. APTT 29.8.   EKG shows normal sinus rhythm with T wave inversions in anterolateral leads, T wave inversions in V3 to V6 new from prior EKG.   ASSESSMENT AND PLAN:  1. NSTEMI. The patient has severely elevated cardiac enzymes with new EKG changes. Discussed the case with Dr. Mariah Milling. The patient will be admitted to CCU for a cath tomorrow morning, on aspirin and heparin. No Plavix suggested. The patient is on a beta-blocker and ACE inhibitor. Her ACE inhibitor and metformin are being held at this time as the patient will be getting contrast for the cath. Close monitoring needed. Will be on tele in ICU and oxygen. Morphine for pain. Nitro paste will also be applied.  2. Hypertension. Continue medications.   3. Diabetes mellitus. Hold metformin secondary to contrast tomorrow with the cath. Will be on sliding scale insulin.  4. CKD, stage III, seems stable. Will monitor post cath.  5. DVT prophylaxis, on heparin drip.   CODE STATUS: FULL CODE.  TIME SPENT: Time spent today on this case was 70 minutes with more than 50% time spent in coordination of care.   ____________________________ Molinda Bailiff. Mizael Sagar, MD srs:drc D: 10/29/2011 18:49:51 ET T: 10/30/2011 06:20:25 ET JOB#: [id number]  cc: Wardell Heath R. Shaleka Brines, MD, <Dictator> [facility] Antonieta Iba, MD Orie Fisherman MD ELECTRONICALLY SIGNED 10/30/2011 14:19

## 2014-06-12 NOTE — Op Note (Signed)
PATIENT NAME:  Toni Marshall, Toni Marshall MR#:  [id number] DATE OF BIRTH:  12-26-1945  DATE OF PROCEDURE:  11/01/2011  PREOPERATIVE DIAGNOSIS:  1. Cardiorespiratory failure.  2. Hypotension. 3. Need for central venous access.   POSTOPERATIVE DIAGNOSIS:  1. Cardiorespiratory failure.  2. Hypotension. 3. Need for central venous access.   PROCEDURE PERFORMED: Insertion of right internal jugular vein triple lumen catheter, non-tunneled, non-cuffed.   SURGEON: Natale Lay, MD   ASSISTANT: None.   ANESTHESIA: 5 mL of 1% lidocaine.    DESCRIPTION OF PROCEDURE: With the patient in the Trendelenburg position, a shoulder roll across the scapula in a transverse axis, the right chest and neck were sterilely prepped and draped with ChloraPrep solution. Time out was observed. A total of 2.5 mL of 1% lidocaine was infiltrated in the infraclavicular notch on the right side. Despite two attempts on the right side, I could not cannulate the subclavian vein.   A total of 2.5 mL of 1% lidocaine was then infiltrated in the sternocleidomastoid notch on the right side. A 22-gauge finder needle was used to cannulate the internal jugular vein on the right side lateral to the carotid pulse. A larger bore needle was then placed with return of venous-appearing nonpulsatile blood. A wire was passed easily. A skin incision was fashioned over the wire with a #11 blade. An 8-French  percutaneous dilatation of the skin and subcutaneous tract was then performed without difficulty. The catheter, having been previously flushed on the back table, was inserted and secured over the wire at 18 cm, and all three ports flushed and aspirated without difficulty. It was secured to the skin site with multiple silk sutures. Pressure caps were then placed. Tegaderm dressing was then applied, and the patient then returned supine, and a chest x-ray is currently pending at the time of this dictation.   ____________________________ Redge Gainer Egbert Garibaldi,  MD mab:cbb D: 11/01/2011 06:00:31 ET T: 11/01/2011 12:04:30 ET JOB#: [id number]  cc: Loraine Leriche A. Egbert Garibaldi, MD, <Dictator> Raynald Kemp MD ELECTRONICALLY SIGNED 11/02/2011 15:50

## 2014-06-12 NOTE — Discharge Summary (Signed)
PATIENT NAME:  Toni Marshall, Toni Marshall MR#:  [id number] DATE OF BIRTH:  1945-03-08  DATE OF ADMISSION:  10/29/2011 DATE OF DISCHARGE:  11/13/2011  ADMITTING PHYSICIAN: Dr. Elpidio Anis DISCHARGING PHYSICIAN: Dr. Enid Baas    PRIMARY CARE PHYSICIAN:  Dr. Tyrone Sage, [facility]  CONSULTATIONS IN THE HOSPITAL:   1. Cardiology consultations by Dr. Kirke Corin and Dr. Mariah Milling.  2. Surgical consultation by Dr. Egbert Garibaldi for central line placement.  3. Pulmonary critical care consultation by Dr. Meredeth Ide.   DISCHARGE DIAGNOSES:  1. Acute respiratory failure.  2. Pulmonary edema. 3. Bibasilar pneumonia.  4. Congestive heart failure, chronic systolic dysfunction with ejection fraction 45%.  5. NSTEMI,  recommended medical management.  6. Coronary artery disease, status post bypass graft surgery.  7. Diabetes mellitus.  8. Hypertension.  9. Cardiogenic shock.  10. Hyperlipidemia.  11. Glaucoma.  12. Allergic reaction to vancomycin with hypotension and hypoxia.  13. Chronic diarrhea.  DISCHARGE MEDICATIONS:  1. Aspirin 81 mg p.o. daily.  2. Folic acid 1 mg p.o. daily.  3. Atorvastatin 40 mg p.o. daily.  4. Magnesium oxide 400 mg p.o. b.i.d.  5. Klor-Con 10 mEq p.o. b.i.d.  6. Protonix 20 mg p.o. daily.  7. Combigan eyedrops one drop to each eye once a day.  8. Lasix 40 mg p.o. daily.  9. Tylenol 325 mg to 650 mg every four hours as needed for pain.  10. Lantus 15 units subcutaneous b.i.d.  11. Plavix 75 mg p.o. daily.  12. Coreg 3.125 mg p.o. b.i.d.   DISCHARGE DIET: Low sodium, carbohydrate-controlled diet.  HOME OXYGEN: 2 liters.   DISCHARGE ACTIVITY: As tolerated.     FOLLOWUP INSTRUCTIONS:  1. Physical therapy.  2. Incentive spirometry.  3. Primary care physician followup in 1 to 2 weeks.  4. Cardiology followup with Dr. Mariah Milling in 2 to 3 weeks.  5. Wean oxygen as tolerated. 6. Fingersticks before meals and at bedtime.  LABS AND IMAGING STUDIES: Sodium 137, potassium 3.2, which  was replaced. Chloride 99, bicarbonate 27, BUN 38, creatinine 1.52, glucose 168, calcium 9.6. Stool for C. difficile is negative. WBC 11.2, hemoglobin 9.4, hematocrit 29, platelet count 310.  Last chest x-ray while Toni Marshall was on the vent showing early mild infiltrate cannot be excluded, shallow inspiration, and mild pulmonary vascular congestion is seen.   Echo Doppler showing mild to moderately reduced LV systolic function, ejection fraction 40-45%.  Impaired LV relaxation and concentric left ventricular hypertrophy is present. Apical akinesis with severe hypokinesis of the distal anterior and distal inferior wall  and mild mitral regurgitation are present.   BRIEF HOSPITAL COURSE: Toni Marshall is a 69 year old elderly African American female with past medical history significant for coronary artery disease status post bypass graft surgery 12/2010 at [facility] where Toni Marshall had blocked vessel which was difficult for intervention and medical management was recommended.  Toni Marshall also has diabetes and hypertension. Toni Marshall presented secondary to vague back pain and also chest pain. Toni Marshall was admitted for acute non-ST-segment elevation myocardial infarction with elevated cardiac enzymes and new T-wave inversions in the lateral leads. 1. NSTEMI: The patient was initially admitted to the Critical Care Unit for NSTEMI, seen by  cardiologist, started on heparin drip, aspirin, and no Plavix. Toni Marshall was seen by cardiologist Dr. Kirke Corin who went over her records from [facility] and ordered an echocardiogram which showed preserved LV function at 40 to 45% but has akinesis/hypokinesis of the apical, distal anterior, and also inferior walls.  However, going over records from [facility]  it seems like Toni Marshall has severely diseased LAD and a hairpin turn, not amenable to any intervention. Toni Marshall became critically ill with respiratory failure ending up on the ventilator. Cardiac catheterization was done here with LAD occlusion and management at this time is recommended to  be mostly medical management and cardiac rehab. Re-discuss her case in cardiology conference and see if they could come up with any other solutions at this time. Because of her extremely low blood pressures in the hospital Toni Marshall is being discharged only on low dose beta blocker, that is, Coreg, and will follow up with Dr. Mariah Milling or Dr. Kirke Corin in the office for further changes. Toni Marshall is also on aspirin and Plavix. IV heparin was stopped while in the hospital. 2. Acute respiratory failure multifactorial in nature: The patient became hypotensive after vancomycin. Toni Marshall had bibasilar pneumonia and also mild pulmonary edema on chest x-ray. Toni Marshall  was on the ventilator and difficult to be weaned off. The patient was intubated on 11/01/2011 and extubated 11/10/2011. Toni Marshall was diuresed with Lasix for flash pulmonary edema and was on antibiotics with Zosyn and Zyvox for possible pneumonia. The patient did have a possible allergic reaction with vancomycin with hypotension and also hypoxia after which Toni Marshall went into respiratory distress. So vancomycin has been added to her allergies. Toni Marshall did finish ten days of  treatment with the antibiotics. Chest x-ray shows improvement and possible atelectasis at the bases. Toni Marshall is currently on 2 liters nasal cannula, very much improved sats, slightly dropping on exertion.   3. Acute on chronic combined congestive heart failure, ejection fraction of 45%: Because of her hypotension  medications are being carefully used and Toni Marshall is stable on 40-mg daily dose of Lasix at this time.  4. Cardiogenic shock: Likely from reinfarction and NSTEMI. Toni Marshall was hypotensive in the hospital requiring Levophed for several days and had to use Solu-Cortef IV to be weaned off the Levophed.  Currently blood pressures are stable and Toni Marshall is only on low dose Coreg for cardioprotection. 5. Diabetes mellitus, uncontrolled: Her sugars were elevated while in the Intensive Care Unit. Hemoglobin A1c is only 7.1. Her sugars are  stable on the Lantus twice a day dose and Toni Marshall will be discharged on Lantus. Toni Marshall will need to be checked and monitored to prevent hypoglycemia.  6. Her other home medications were continued. Her course has been otherwise uneventful in the hospital. Physical therapy worked with her in the hospital and recommended short-term rehab.     DISCHARGE CONDITION: Stable.   DISCHARGE DISPOSITION: To rehab.   TIME SPENT ON DISCHARGE: 45 minutes.      ____________________________ Enid Baas, MD rk:bjt D: 11/13/2011 14:51:43 ET T: 11/13/2011 15:33:56 ET JOB#: [id number]  cc: Enid Baas, MD, <Dictator> Sarah "Sallie" Allena Katz, MD Antonieta Iba, MD Enid Baas MD ELECTRONICALLY SIGNED 11/13/2011 17:06

## 2014-06-12 NOTE — Consult Note (Signed)
PATIENT NAME:  Toni Marshall, Toni Marshall MR#:  [id number] DATE OF BIRTH:  12-25-1945  DATE OF CONSULTATION:  10/30/2011  REFERRING PHYSICIAN:  Dr Elpidio Anis CONSULTING PHYSICIAN:  Jerolyn Center A. Kirke Corin, MD PRIMARY CARE PHYSICIAN: [facility].  PRIMARY CARDIOLOGIST: [facility].   REASON FOR CONSULTATION: Non-ST elevation myocardial infarction.   HISTORY OF PRESENT ILLNESS: This is a 69 year old African American female with known history of coronary artery disease, status post coronary artery bypass graft surgery twice. The first one was in 2002. The most recent one was in November of 2012. Both of them were done at [facility]. She has multiple other chronic conditions that include type 2 diabetes, hypertension, and hyperlipidemia. She started having back discomfort radiating to her chest on Wednesday night. The discomfort was associated with diaphoresis and shortness of breath. This continued overnight, and she came to the emergency room on Thursday afternoon. By that time she was found to have a troponin of greater than 40. She has been chest pain free since that time. EKG showed anterior and anterolateral T-wave changes with minor ST elevation.   PAST MEDICAL HISTORY:  1. Coronary artery disease, status post CABG twice as outlined above.  2. Hypertension.  3. Hyperlipidemia.  4. Type 2 diabetes.  5. Rheumatoid arthritis.   ALLERGIES: No known drug allergies.   HOME MEDICATIONS:  1. Aspirin 81 mg daily.  2. Atorvastatin 40 mg daily.  3. Folic acid 1 mg daily.  4. Isosorbide mononitrate 15 mg daily.  5. Potassium chloride 10 mEq once daily.  6. Lasix 20 mg twice daily.  7. Lisinopril 10 mg once daily.  8. Magnesium oxide 400 mg twice daily.  9. Metformin 500 mg twice daily.  10. Metoprolol tartrate 25 mg twice daily.  11. Protonix 40 mg once daily.   FAMILY HISTORY: Family history is remarkable for premature coronary artery disease and diabetes.   SOCIAL HISTORY: Negative for smoking, alcohol,  or recreational drug use.   REVIEW OF SYSTEMS: Ten-point review of systems was performed. It is negative other than what is mentioned in the HPI.   PHYSICAL EXAMINATION:   GENERAL: The patient appears to be at her stated age in no acute distress.   VITAL SIGNS: Temperature is 98.3, pulse is 81, respiratory rate is 18, blood pressure is 107/71, and oxygen saturation is 97% on room air.   HEENT: Normocephalic, atraumatic.   NECK: No jugular venous distention or carotid bruits.   RESPIRATORY: Normal respiratory effort with no use of accessory muscles. Auscultation reveals normal breath sounds.   CARDIOVASCULAR: Normal PMI. Normal S1 and S2 with no gallops or murmurs.   ABDOMEN: Benign, nontender, nondistended.   EXTREMITIES: No clubbing, cyanosis, or edema.   SKIN: Warm and dry with no rash.   PSYCHIATRIC: She is alert, oriented x3 with normal mood and affect.   LABORATORY AND DIAGNOSTIC DATA: Her labs showed a creatinine of 1.35 with a BUN of 37. Troponin was greater than 40 with an initial CK-MB of 146 which subsequently went down to 77.4. Hemoglobin was normal. ECG showed sinus rhythm with minor ST elevation in the anterior and anterolateral leads with deeply inverted T waves.   IMPRESSION:  1. Non-ST elevation myocardial infarction.  2. Known history of coronary artery disease, status post coronary artery bypass graft twice.  3. Mild acute renal failure.   RECOMMENDATIONS: It appears that the patient had a non-ST elevation myocardial infarction which likely started on Wednesday night. Her cardiac enzymes are trending down. It appears  that she likely occluded one of her vein grafts. She has been chest pain free since yesterday, but obviously had a robust elevation in cardiac markers. I recommend an echocardiogram to be done this morning. I recommend proceeding with urgent cardiac catheterization and possible coronary intervention. We will obtain records from [facility]. In the meantime,  continue IV hydration and unfractionated heparin. Please note that the patient's previous cardiologist was Dr Lady Gary. However, the patient has not been following up there. I gave her the option of getting a cardiology consult from [facility] but the patient requested that we see her.   ____________________________ Chelsea Aus. Kirke Corin, MD maa:vtd D: 10/30/2011 08:43:09 ET T: 10/30/2011 12:15:39 ET JOB#: [id number]  cc: Muhammad A. Kirke Corin, MD, <Dictator> Iran Ouch MD ELECTRONICALLY SIGNED 11/27/2011 13:16

## 2014-06-12 NOTE — H&P (Signed)
PATIENT NAME:  Toni Marshall, Toni Marshall MR#:  [id number] DATE OF BIRTH:  Sep 20, 1945  DATE OF ADMISSION:  01/28/2012  HISTORY OF PRESENT ILLNESS:  The patient is a 69 year old African American female with past medical history significant for history of coronary artery disease who had prolonged stay in the hospital in September 2013 and was discharged on 11/18/2011 for non-Q-wave myocardial infarction, also history of congestive heart failure, who presented back to the hospital with complaints of right shoulder blade pain. According to the patient, she was doing well up until the morning on the day of admission when she started having right shoulder blade discomfort pain. Pain was described as achy which comes and goes with no significant changes whenever she moves around or takes a deep breath. Pain was at its intensity 5/10 by intensity and then it would go down to 0 by intensity. She denied any significant shortness of breath, however, admits of lightheadedness and dizziness. The patient's family admits that the patient had been complaining of lightheadedness and dizziness on and off for the past one week, had no palpitations or feeling presyncopal, however, according to the patient if she stays upright for a long period of time, she would feel somewhat presyncopal and she would have balance problems. On arrival to the Emergency Room, her heart rate is 69, blood pressure 113/69, saturation was 99% on room air and hospitalist services were contacted for admission because of her pains. Her troponin was also found to be mildly elevated.   PAST MEDICAL HISTORY:  1. History of prolonged stay in the hospital time from 12/05 to 02/12/2012 for acute respiratory failure, pulmonary edema, bibasilar pneumonia, congestive heart failure, chronic systolic dysfunction with ejection fraction of 45%.  2. History of NSTEMI. Recommended medical therapy.  3. Coronary artery disease status post bypass graft surgery. 4. Diabetes  mellitus. 5. Hypertension. 6. Cardiogenic shock. 7. Hyperlipidemia. 8. Glaucoma. 9. Allergic reaction to vancomycin with hypertension as well as hypoxia.  10. History of chronic diarrhea. 11. History of hyperlipidemia. 12. Rheumatoid arthritis.   PAST SURGICAL HISTORY: Cardiac catheterization and coronary artery bypass grafting in 2002 and 2012.  DRUG ALLERGIES: No known drug allergies.   MEDICATIONS: According to medical records, the patient is on: 1. Acetaminophen 325 mg 2 tablets every four hours as needed.  2. Aspirin 81 mg p.o. daily.  3. Atorvastatin 40 mg p.o. at bedtime.  4. Carvedilol 3.125 mg p.o. twice daily.   5. Clopidogrel 75 mg p.o. daily.   6. Combivent 0.2/0.5% ophthalmic solution, one drop to each eye once a day.  7. Folic acid once daily.  8. Furosemide 40 mg p.o. daily.  9. Klor-Con 10 mEq p.o. twice daily.  10. Lisinopril 10 mg p.o. daily.  11. Magnesium oxide 400 mg p.o. twice daily.  12. Metformin 500 mg p.o. twice daily.  13. Ranexa 1000 mg p.o. twice daily.   FAMILY HISTORY: The patient's mother deceased at the age of 62 secondary to myocardial infarction. The patient's father deceased in his 22s, also history of diabetes mellitus.   SOCIAL HISTORY: No smoking, alcohol abuse or illicit drug abuse. [place]  CODE STATUS:  FULL CODE.   REVIEW OF SYSTEMS: Positive for hot flashes and sweats, seem to be chronic, some pains in the posterior chest, below shoulder blades, weight loss more than three pounds in one week. The patient apparently is trying to follow diet, but she is not able to eat much of fried foods because of nausea  intermittently. Also, admits of having some blurring of vision. Diagnosis of glaucoma and surgery of her left eye approximately two years ago. History of postnasal drip with some cough whenever that drips into her throat but otherwise no sputum production. Some chest pains in the posterior chest, lower extremity  edema which seemed to be on and off, intermittent nausea due to fried foods, three pillow orthopnea. Otherwise, denies fevers, fatigue, weakness, weight gain. EYES: In regards to eyes, denies double vision or cataracts. ENT: Denies any tinnitus, allergies, epistaxis, sinus pain, dentures, or difficulty swallowing. RESPIRATORY: Denies any cough, wheezes, asthma, or chronic obstructive pulmonary disease. CARDIOVASCULAR: Denies orthopnea, arrhythmias, palpitations or syncope. GASTROINTESTINAL: Denies any vomiting, diarrhea, or constipation. GENITOURINARY: Denies dysuria, frequency, or incontinence. ENDOCRINOLOGY: Denies any polydipsia, nocturia, thyroid problems, heat or cold intolerance or thirst. HEMATOLOGY: Denies any anemia, easy bruisability, bleeding or swollen glands. SKIN: Denies any acne, rashes, lesions or change in moles. MUSCULOSKELETAL: Denies arthritis, cramps, or swelling. NEUROLOGIC: No numbness, epilepsy or tremor. PSYCHIATRIC: Denies anxiety, insomnia, or depression.   PHYSICAL EXAMINATION:  VITAL SIGNS: On arrival to the hospital, temperature 97.7, pulse 70, respiratory rate 20, blood pressure 134/80, saturation 98% on room air.   GENERAL: Well-developed, well-nourished, obese African American female in no significant distress, comfortable on the stretcher.   HEENT: Her pupils are equal and reactive to light. Extraocular movements intact. No icterus or conjunctivitis. Has normal hearing. No pharyngeal erythema. Mucosa is moist.   NECK: Neck did not reveal any masses, supple, nontender. Thyroid is not enlarged. No adenopathy. No JVD or carotid bruits bilaterally. Full range of motion.   LUNGS: Clear to auscultation in all fields. No diminished breath sounds bilaterally. Few crackles were heard bilaterally. No rhonchi or wheezing. No labored inspirations, decreased effort, dullness to percussion. No over respiratory distress.   CARDIOVASCULAR: S1, S2 present. No murmurs, rubs, or gallops  were noted. PMI not lateralized. Chest is nontender to palpation. 1+ pedal pulses. No lower extremity edema, calf tenderness or cyanosis was noted.   ABDOMEN: Soft, nontender. Bowel sounds present. No splenomegaly or masses were noted.   RECTAL: Deferred.   MUSCULOSKELETAL: Able to move all extremities. No cyanosis, degenerative joint disease, or kyphosis. Gait is not tested.   SKIN: Denies any rashes, lesions, erythema, nodularity, or induration. It was warm and dry to palpation. No adenopathy in the cervical region.   NEUROLOGICAL: Cranial nerves grossly intact. Sensory is intact. No dysarthria or aphasia. The patient is alert, oriented to time, person, and place, cooperative. Memory is somewhat impaired, but no significant confusion, agitation, or depression.   LABORATORY, RADIOLOGICAL AND DIAGNOSTIC DATA: EKG showed normal sinus rhythm at 73 beats per minutes, normal axis, no acute ST-T changes, however, the patient did have some T abnormalities which were concerning for lateral ischemia as compared to prior EKG done on 10/31/2011. At that time, the patient was tachycardic with a rate of 112 and changed since prior EKG. BMP showed BUN and creatinine of 29 and 1.35, glucose 119, bicarbonate level 20, lipase level of 60. Liver enzymes are normal. Cardiac enzymes, troponin was slightly elevated at 0.09. CBC within normal limits. No left shift. Urinalysis: Yellow hazy urine, negative for glucose or bilirubin, trace ketones, specific gravity 1.026, pH 7.0, negative for blood, 30 mg/dL protein, negative for nitrites, 1+ leukocyte esterase, 2 red blood cells, 9 white blood cells, 1+ bacteria, 5 epithelial cells. Mucous is present as well as 10 hyaline casts. Chest x-ray: Portable single view 01/28/2012 revealed mild  interstitial edema. Ultrasound of abdomen 01/28/2012 revealed cholelithiasis as well as sonographic evidence of acute cholecystitis.   ASSESSMENT AND PLAN:  1. Chest pain, unclear etiology,  could be related just to musculoskeletal problems. However, cannot rule out cardiac issues, especially in view of elevated troponin. Admit the patient to the medical floor. Continue Coreg. Add nitroglycerin topically as well as heparin. Will continue the patient on aspirin. Get cardiologist involved for further recommendations.  2. Hypertension. Seemed to be well controlled. Continue outpatient medications.  3. Hyperlipidemia. Continue outpatient medications.  4. Gallstones. The patient will follow-up with surgeon as outpatient. We will schedule upon discharge.   TIME SPENT: 50 minutes.   ____________________________ Katharina Caper, MD rv:ap D: 01/28/2012 10:51:21 ET T: 01/28/2012 11:19:26 ET JOB#: [id number]  cc: Katharina Caper, MD, <Dictator> Pius Byrom MD ELECTRONICALLY SIGNED 02/06/2012 16:15

## 2014-06-12 NOTE — Consult Note (Signed)
General Aspect Toni Marshall is a very pleasant 69 year old woman with history of coronary artery disease, bypass surgery in 2002, repeat bypass November 2012 at [facility], diabetes, hypertension, hyperlipidemia, obesity with non-STEMI 10/29/2011 with presentation to [facility]. Toni Marshall had a long complicated hospital course with acute respiratory failure, bibasilar pneumonia.  Catheterization in 10/30/2011 showed severe three-vessel coronary artery disease, patent vein graft to the RCA, occluded vein graft to the OM and LIMA to the LAD. Attempted LAD PCI which was unsuccessful.this was attempted previously twice and was not successful. Echocardiogram showed ejection fraction 40-45%, apical akinesis with severe distal anterior and distal inferior wall hypokinesis, mild MR. Toni Marshall was discharged to [facility]/rehabilitation.  Toni Marshall presented this time with back discomfort on the right side. Toni Marshall reports having  similar symptoms with her previous myocardial infarction. Toni Marshall denies chest pain. Troponin was borderline elevated at 0.09 with normal CK-MB Toni Marshall denies worsening exertional symptoms.   Physical Exam:   GEN no acute distress    HEENT red conjunctivae    NECK supple    RESP normal resp effort  clear BS    CARD Regular rate and rhythm  No murmur    ABD denies tenderness    LYMPH negative neck    EXTR negative cyanosis/clubbing, negative edema    SKIN normal to palpation    NEURO cranial nerves intact    PSYCH alert, A+O to time, place, person   Review of Systems:   Subjective/Chief Complaint bback pain    General: No Complaints    Skin: No Complaints    ENT: No Complaints    Eyes: No Complaints    Neck: No Complaints    Respiratory: No Complaints    Cardiovascular: No Complaints    Gastrointestinal: No Complaints    Genitourinary: No Complaints    Vascular: No Complaints    Musculoskeletal: No Complaints    Neurologic: No Complaints    Hematologic: No  Complaints    Endocrine: No Complaints    Psychiatric: No Complaints   Home Medications: Medication Instructions Status  furosemide 40 mg oral tablet 1 tab(s) orally once a day Active  carvedilol 3.125 mg oral tablet 1 tab(s) orally 2 times a day Active  clopidogrel 75 mg oral tablet 1 tab(s) orally once a day Active  acetaminophen 325 mg oral tablet 2 tab(s) orally every 4 hours, As needed, pain or temp. greater than 100.4 Active  aspirin 81 mg oral tablet 1 tab(s) orally once a day Active  folic acid 1 tab(s) orally once a day Active  atorvastatin 40 mg oral tablet 1 tab(s) orally once a day (at bedtime) Active  magnesium oxide 400 mg (241.3 mg elemental magnesium) oral tablet 1 tab(s) orally 2 times a day Active  Klor-Con 10 oral tablet, extended release 1 tab(s) orally 2 times a day Active  lisinopril 10 mg oral tablet 1 tab(s) orally once a day Active  metformin 500 mg oral tablet 1 tab(s) orally 2 times a day Active  Ranexa 1000 mg oral tablet, extended release 1 tab(s) orally 2 times a day Active  Combigan 0.2%-0.5% ophthalmic solution 1 drop(s) to left eye 2 times a day Active   Lab Results: Hepatic:  05-Dec-13 06:00    Bilirubin, Total 0.3   Alkaline Phosphatase 101   SGPT (ALT) 16   SGOT (AST) 24   Total Protein, Serum 7.9   Albumin, Serum 3.6  Cardiology:  05-Dec-13 05:54    Ventricular Rate 73   Atrial Rate  73   P-R Interval 202   QRS Duration 90   QT 424   QTc 467   P Axis 35   R Axis -11   T Axis 88   ECG interpretation Normal sinus rhythm T wave abnormality, consider lateral ischemia Abnormal ECG When compared with ECG of 31-Oct-2011 23:48, Vent. rate has decreased BY  39 BPM Minimal criteria for Anterior infarct are no longer Present T wave inversion now evident in Anterior leads ----------unconfirmed---------- Confirmed by OVERREAD, NOT ([id number]), editor PEARSON, BARBARA ([id number]) on 01/28/2012 12:03:15 PM  Routine Chem:  05-Dec-13 06:00    Result Comment  troponin - RESULTS VERIFIED BY REPEAT TESTING.  - READ-BACK PROCESS PERFORMED.  Marland Kitchen Theodis Sato 12/5/13at [id number]  Result(s) reported on 28 Jan 2012 at 06:41AM.   Lipase  60 (Result(s) reported on 28 Jan 2012 at 06:44AM.)   Glucose, Serum  119   BUN  29   Creatinine (comp)  1.35   Sodium, Serum 136   Potassium, Serum 4.2   Chloride, Serum 106   CO2, Serum  20   Calcium (Total), Serum 9.3   Osmolality (calc) 279   eGFR (African American)  48   eGFR (Non-African American)  41 (eGFR values <11m/min/1.73 m2 may be an indication of chronic kidney disease (CKD). Calculated eGFR is useful in patients with stable renal function. The eGFR calculation will not be reliable in acutely ill patients when serum creatinine is changing rapidly. It is not useful in  patients on dialysis. The eGFR calculation may not be applicable to patients at the low and high extremes of body sizes, pregnant women, and vegetarians.)   Anion Gap 10    14:24    Result Comment TROPONIN - RESULTS VERIFIED BY REPEAT TESTING.  - PREVIOUSLY NOTIFIED AT [id number] 01/28/12  - BY [id number]  Result(s) reported on 28 Jan 2012 at 03:12PM.  Cardiac:  05-Dec-13 06:00    Troponin I  0.09 (0.00-0.05 0.05 ng/mL or less: NEGATIVE  Repeat testing in 3-6 hrs  if clinically indicated. >0.05 ng/mL: POTENTIAL  MYOCARDIAL INJURY. Repeat  testing in 3-6 hrs if  clinically indicated. NOTE: An increase or decrease  of 30% or more on serial  testing suggests a  clinically important change)   CK, Total 72   CPK-MB, Serum 1.0 (Result(s) reported on 28 Jan 2012 at 06:41AM.)    14:24    Troponin I  0.09 (0.00-0.05 0.05 ng/mL or less: NEGATIVE  Repeat testing in 3-6 hrs  if clinically indicated. >0.05 ng/mL: POTENTIAL  MYOCARDIAL INJURY. Repeat  testing in 3-6 hrs if  clinically indicated. NOTE: An increase or decrease  of 30% or more on serial  testing suggests a  clinically important change)   CK, Total 75   CPK-MB,  Serum 1.0 (Result(s) reported on 28 Jan 2012 at 03:10PM.)  Routine UA:  05-Dec-13 07:40    Color (UA) Yellow   Clarity (UA) Hazy   Glucose (UA) Negative   Bilirubin (UA) Negative   Ketones (UA) Trace   Specific Gravity (UA) 1.026   Blood (UA) Negative   pH (UA) 7.0   Protein (UA) 30 mg/dL   Nitrite (UA) Negative   Leukocyte Esterase (UA) 1+ (Result(s) reported on 28 Jan 2012 at 08:18AM.)   RBC (UA) 2 /HPF   WBC (UA) 9 /HPF   Bacteria (UA) 1+   Epithelial Cells (UA) 5 /HPF   Mucous (UA) PRESENT   Hyaline Cast (UA) 10 /LPF (Result(s) reported on 28 Jan 2012 at 08:18AM.)  Routine Hem:  05-Dec-13 06:00    WBC (CBC) 6.8   RBC (CBC) 4.59   Hemoglobin (CBC) 12.5   Hematocrit (CBC) 37.8   Platelet Count (CBC) 258   MCV 82   MCH 27.2   MCHC 33.0   RDW  16.4   Neutrophil % 56.9   Lymphocyte % 30.2   Monocyte % 9.0   Eosinophil % 3.3   Basophil % 0.6   Neutrophil # 3.9   Lymphocyte # 2.1   Monocyte # 0.6   Eosinophil # 0.2   Basophil # 0.0 (Result(s) reported on 28 Jan 2012 at 06:38AM.)   EKG:   EKG Interp. by me  NSR    Interpretation lateral T wave changes and anteroseptal T wave changes suggestive of ischemia.    EKG Comparision Not changed from   Radiology Results: [place]:    05-Dec-13 06:39, US Abdomen Limited Survey   US Abdomen Limited Survey    REASON FOR EXAM:    RUQ/right shoulder pain and nausea  COMMENTS:   Body Site: GB and Fossa, CBD, Head of Pancreas    PROCEDURE: [place]  - US ABDOMEN LIMITED SURVEY  - Jan 28 2012  6:39AM     RESULT: Comparison: None    Technique: Multiple gray-scale andcolor-flow Doppler images of the right   upper quadrant are presented for review.    Findings:    Visualized portions of the liver demonstrate normal echogenicity and   normal contours. The liver is without evidence of a focal hepatic lesion.   Thereare cholelithiasis. There is no intra- or extrahepatic biliary   ductal dilatation. The common duct measures 4.8 mm in  maximal diameter.   There is no gallbladder wall thickening, pericholecystic fluid, or   sonographic Murphy's sign.     The pancreas is not visualized secondary to overlying bowel gas.    IMPRESSION:     Cholelithiasis without sonographic evidence of acute cholecystitis.    Dictation Site: 1        Verified By: Jennette Banker, M.D., MD    vancomycin: Hypotension  Vital Signs/Nurse's Notes: **Vital Signs.:   05-Dec-13 11:00   Vital Signs Type Admission   Temperature Temperature (F) 97.9   Celsius 36.6   Temperature Source oral   Pulse Pulse 66   Respirations Respirations 18   Systolic BP Systolic BP 585   Diastolic BP (mmHg) Diastolic BP (mmHg) 85   Mean BP 102   Pulse Ox % Pulse Ox % 98   Pulse Ox Activity Level  At rest   Oxygen Delivery Room Air/ 21 %    16:18   Vital Signs Type Routine   Temperature Temperature (F) 97.6   Celsius 36.4   Temperature Source axillary   Pulse Pulse 74   Respirations Respirations 18   Systolic BP Systolic BP 277   Diastolic BP (mmHg) Diastolic BP (mmHg) 71   Mean BP 83   Pulse Ox % Pulse Ox % 97   Pulse Ox Activity Level  At rest   Oxygen Delivery Room Air/ 21 %     Impression 1. Possible unstable angina with atypical symptoms. 2. Known history of coronary artery disease as outlined above. 3. Gallstones. 4. Hypertension well controlled.    Plan the patient reports having back pain with her previous myocardial infarction. I do not see any new ECG changes. The patient had multiple failed attempts of LAD revascularization. Thus, there is probably not  much benefit to repeating cardiac catheterization. I doubt that there will be any new obstructive disease. Toni Marshall has slight elevation in troponin with normal CPK. Continue serial cardiac enzymes. If her cardiac enzymes remained flat, we'll likely recommend continuing medical therapy. Toni Marshall is in good antianginal medications and has responded well to Ranexa.   Electronic Signatures: Kathlyn Sacramento (MD)  (Signed 05-Dec-13 19:19)  Authored: General Aspect/Present Illness, History and Physical Exam, Review of System, Home Medications, Labs, EKG , Radiology, Allergies, Vital Signs/Nurse's Notes, Impression/Plan   Last Updated: 05-Dec-13 19:19 by Kathlyn Sacramento (MD)

## 2014-06-15 NOTE — Discharge Summary (Signed)
PATIENT NAME:  Toni Marshall, Toni Marshall MR#:  [id number] DATE OF BIRTH:  [birth year]  DATE OF ADMISSION:  09/16/2012 DATE OF DISCHARGE:  09/19/2012  CONSULTANTS: Dr. Wyn Quaker from vascular surgery.   PRIMARY CARE PHYSICIAN:  Phineas Real [facility].   PRIMARY CARDIOLOGIST:  Dr. Mariah Milling   CHIEF COMPLAINT: Left upper quadrant abdominal pain.   DISCHARGE DIAGNOSES: 1.  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  2.  Acute on chronic renal failure.  3.  Chronic mild systolic congestive heart failure.  4.  Mild hyponatremia.  5.  History of coronary artery disease status post coronary artery bypass graft x 2.  6.  Rheumatoid arthritis.  7.  Type 2 diabetes.  8.  Hyperlipidemia.  9.  Hypertension.  10.  History of myocardial infarction.   DISCHARGE MEDICATIONS: Atorvastatin 40 mg daily at bedtime, Klor-Con 10 mEq 2 times a day, clopidogrel 75 mg once a day, carvedilol  3.125 mg 2 times a day, furosemide 40 mg once a day, lisinopril 10 mg daily, metformin 500 mg 1 tab 2 times a day, Ranexa 1000 mg 2 times a day, Combigan one drop to left eye 2 times a day, Imdur 30 mg once a day in the morning, aspirin 81 mg daily, folic acid 0.8 mg daily, dorzolamide ophthalmic 2% solution one drop to the left eye 2 times a day, Percocet 325/5 mg 1 tab every six hours as needed for pain x 3 days only. Slow-Mag 119 mg/71.5 mg oral delayed release tablet 1 tab once a day.   DIET: Low sodium, low fat, ADA, low cholesterol diet.   FOLLOWUP: Please follow with PCP within 1 to 2 weeks. Please follow with her cardiologist on August 8 as previously scheduled by you and discuss about being placed on a Holter monitor to look for any irregular heart rhythms.   SIGNIFICANT LABORATORY, DIAGNOSTIC AND RADIOLOGIC DATA:  Initial BUN was 27, creatinine 1.28, sodium 132, peak creatinine was 1.65 on July 26. Last creatinine 1.3 on July 27. Lipase was 48 on arrival. Albumin was 3.1 on admission. Hemoglobin A1c was 6.9. Otherwise, LFTs are within  normal limits. Troponin was negative. Initial WBC of 10.5, hemoglobin 12.6, platelets of 235. Initial d-dimer was elevated at 3.93.   Echocardiogram shows an ejection fraction of 50% to 55%, no significant diastolic dysfunction, mildly dilated left atrium, normal RVSP and systolic function.   CT  of chest PE protocol with abdominal and pelvis with contrast showed no pulmonary embolus. There is some bilateral emphysematous changes, a few  subpleural right lung nodules; the largest 7 mm. There is low attenuation bandlike area within the spleen, which may reflect a laceration secondary to trauma of indeterminate age versus infarct versus infiltrating process. There is no perisplenic hematoma.   HISTORY OF PRESENT ILLNESS AND HOSPITAL COURSE: For full details of H and P, please see the dictation on June 25 by Dr. Jacques Navy,  but briefly, this is a pleasant 69 year old female with history of coronary artery disease, status post myocardial infarction, coronary artery bypass graft, chronic systolic congestive heart failure, who came in with left upper quadrant pain, which was pleuritic in nature, associated with the food deep breath and coughing. She also did have mildly elevated D-dimer and was sent in for a CAT scan, PE protocol. She was noted to have splenic infarct. While in the ER, the case was discussed with vascular surgery, who did not recommend anticoagulation and recommended pain control and work-up for embolic phenomenon. She was admitted to the hospitalist  service with a remote tele. She was started on Percocet as needed and vascular saw the patient on July 26. We also had ordered an echocardiogram. Per vascular, this is likely a small splenic infarct. No aortic aneurysm dissection or other pathology to explain the infarct. The patient was maintained on remote tele monitor, but did not have any significant arrhythmias. The echocardiogram also did not show any significant hypokinesia. The patient will have  a follow up with her primary cardiologist early next month and I discussed did need for possible Holter monitoring. I did run into Dr. Mariah Milling and updated him on our findings. Her pain is controlled. At this point today, vitals are stable. She has temperature on 97.9, pulse of 80, respiratory rate 18, blood pressure 123/77, oxygen saturation of 94% on room air.   Generally, she is an obese [place] American female in no obvious distress, sitting on the chair. She has clear breath sounds on auscultation of her lungs and has regular rate and rhythm on auscultation of her heart. She has no significant abdominal tenderness or rebound or guarding and has no significant edema. By discharge, her creatinine had improved. She did have a bout of acute on chronic renal failure with a bump in her creatinine, which potentially could have been induced by contrast. Her metformin was initially held and at this time, but with the creatinine improving going to baseline, she can resume her metformin.   TOTAL TIME SPENT: 35 minutes.   CODE STATUS: The patient is full code.   ____________________________ Krystal Eaton, MD sa:cc D: 09/19/2012 13:37:47 ET T: 09/19/2012 14:30:08 ET JOB#: [id number]  cc: Krystal Eaton, MD, <Dictator> Antonieta Iba, MD Sarah "Maryruth Hancock, MD Krystal Eaton MD ELECTRONICALLY SIGNED 10/06/2012 13:26

## 2014-06-15 NOTE — H&P (Signed)
PATIENT NAME:  Toni Marshall, Toni Marshall MR#:  [id number] DATE OF BIRTH:  June 15, 1945  DATE OF ADMISSION:  09/16/2012  REFERRING PHYSICIAN:  Chaney Malling, MD  PRIMARY CARE PHYSICIAN:  Phineas Real [facility]   PRIMARY CARDIOLOGIST: Julien Nordmann, MD ([place])  CHIEF COMPLAINT: Left upper quadrant abdominal pain.   HISTORY OF PRESENT ILLNESS: The patient is a pleasant 69 year old female with history of hypertension, hyperlipidemia, CAD status post CABG and chronic systolic CHF.  She presents with 2 days of left upper quadrant abdominal pain and some pleuritic chest pain associated with  deep breaths and coughing. She started to have pain 2 days ago which has been stable and not progressive. It does not radiate. It is felt deep inside her abdomen. The patient has had no fever, chills.  No trauma. No nausea or vomiting, but felt somewhat dizzy yesterday and better today. She came into the hospital where she was noted to have splenic infarcts. Hospitalist service was contacted for further evaluation and management after vascular was consulted by the ER physician who recommended no heparinization at this point and look for embolic phenomena.   PAST MEDICAL HISTORY:  1.  Hypertension. 2.  Hyperlipidemia. 3.  Type 2 diabetes. 4.  Rheumatoid arthritis. 5.  Chronic systolic CHF.  6.  CAD status post CABG x 2, one in 2002 and one in 2012.  7.  Myocardial infarction.  ALLERGIES: VANCOMYCIN.   SOCIAL HISTORY: No tobacco, alcohol or drug use. Lives in [place].   FAMILY HISTORY: Mother with MI at age 76. Father had diabetes.  OUTPATIENT MEDICATIONS:  1.  Acetaminophen 650 mg every 4 hours as needed for pain. 2.  Aspirin 81 mg daily. 3.  Atorvastatin 40 mg daily. 4.  Carvedilol 3.125 mg daily. 5.  Clopidogrel 75 mg daily. 6.  Combigan 0.2/0.5 one drop to left eye 2 times a day. 7.  Dorzolamide 1 drop to left eye 2 times a day. 8.  Folic acid 0.8 mg daily. 9.  Furosemide 40 mg daily. 10.  Imdur  30 mg once a day. 11.  Klor-Con 10 mEq 2 times a day. 12.  Lisinopril 10 mg daily.  13.  Magnesium oxide 400 mg 2 times a day. 14.  Metformin 500 mg 2 times a day. 15.  Ranexa 1000 mg 2 times a day.   REVIEW OF SYSTEMS:  CONSTITUTIONAL: No fever, fatigue, weakness, weight changes or recent trauma to the abdomen. No falls. ENT: No blurry vision or double vision. Has glaucoma.  CARDIOVASCULAR: No chest pain. No palpitations. No arrhythmias. No swelling in the legs. Has hypertension and CAD and CABG.  RESPIRATORY: Has occasional phlegm. No cough, wheezing, shortness of breath or dyspnea on exertion.  GASTROINTESTINAL: No nausea, vomiting, diarrhea. Left upper quadrant abdominal pain, as described above, nonradiating. No bloody stools. No constipation. Has chronic loose stools and diarrhea.  GENITOURINARY: Denies dysuria or hematuria.  HEMATOLOGIC AND LYMPHATIC: No anemia or easy bruising.  SKIN: No rashes.  MUSCULOSKELETAL: Denies arthritis or gout. NEUROLOGIC:  Denies focal weakness or numbness.  PSYCHIATRIC: Denies anxiety or insomnia.   PHYSICAL EXAMINATION: VITAL SIGNS: Temperature here was 98.3 on arrival, pulse rate 68, respiratory 22, blood pressure 111/61 and O2 sat 96% on room air.  GENERAL: The patient is a well-developed, obese African American female, lying in bed in no obvious distress.  HEENT: Normocephalic, atraumatic. Pupils are equal and reactive. Anicteric sclerae. Extraocular muscles intact. Moist mucous membranes.  NECK: Supple. No thyroid tenderness. No lymphadenopathy.  HEART:  S1 and S2 regular rate and rhythm. No murmurs, rubs or gallops.  LUNGS: Clear to auscultation. No wheezing, rhonchi or rales.  ABDOMEN: Soft, nontender and nondistended. No significant tenderness to left upper or right upper quadrant with deep palpation. Normoactive bowel sounds in all quadrants.  EXTREMITIES: No significant lower extremity edema.  NEUROLOGICAL: Cranial nerves II through XII  grossly intact. Strength is 5/5 in all extremities. Sensation is intact to light touch.  PSYCHIATRIC: Awake, alert and oriented x 3. Cooperative.   LABORATORY AND DIAGNOSTICS: Glucose 143. BNP 425. BUN 27, creatinine 1.28, sodium 132, potassium 5.1, albumin 3.1.  Otherwise, LFTs within normal limits. Troponins negative. Hemoglobin is 12.6, WBC 10.5, platelets 235. D-dimer 3.93.  CT PE protocol with abdominal and pelvis CAT scan shows no PE, but low attenuation in a band-like area within the spleen which may reflect laceration secondary to trauma of undetermined age versus infarct versus infiltrating process. No perisplenic hematoma. Few subpleural right lung nodules, largest is 7 mm, in the right upper lung. A followup CAT scan in 3 months is recommended. Mild irregularity of the right side of the carina. Could be mucus. Chest PA and lateral x-ray showing no evidence of pneumonia.   EKG: Normal sinus rhythm. Nonspecific ST and T wave changes. No acute ST elevations. There are some T wave inversions in I and aVL as well as V2 and V1.   ASSESSMENT AND PLAN: We have a pleasant 69 year old African American female with history of coronary artery disease status post CABG twice, diabetes, hypertension and chronic systolic congestive heart failure who presents with left upper quadrant abdominal pain, likely from splenic infarct. The case has already been discussed by the ER physician with vascular, Dr. Wyn Quaker, who recommends pain control and working up for embolic phenomenon without heparinization at this point unless embolic source is found. At this point, would obtain a vascular consult for full recs and admit the patient to the hospitalist service, remote tele bed, and obtain an echocardiogram to look for any arrhythmias and possible atrial fibrillation.  Also will discuss the case with the patient's primary cardiology team to see if she has history of atrial fibrillation as an outpatient, but per cardiology notes  in the past that might not be the case. I would start the patient on some Percocet as she states sometimes she is intolerant to IV pain medications. I would start her on Zofran p.r.n. and continue her coronary artery disease medications.  The patient is chest pain-free. I would continue her Lasix. I would start her on sliding scale insulin, check a hemoglobin A1c, lipid profile, resume her statin. The patient's coronary artery disease is stable. The patient appears to have also chronic kidney disease stage III, which is stable and this will be monitored. I would start her on heparin for deep vein thrombosis prophylaxis. The patient is FULL CODE.  TOTAL TIME SPENT: 50 minutes.   ____________________________ Krystal Eaton, MD sa:sb D: 09/16/2012 11:56:15 ET T: 09/16/2012 12:26:22 ET JOB#: [id number]  cc: Krystal Eaton, MD, <Dictator> Krystal Eaton MD ELECTRONICALLY SIGNED 10/06/2012 13:26

## 2014-06-15 NOTE — Consult Note (Signed)
CHIEF COMPLAINT and HISTORY:  Subjective/Chief Complaint LUQ/lower chest pain   History of Present Illness Patient admitted yesterday with LUQ/chest pain.  new onset over past few days.  No previous issues or history of pain or SOB.  Reports no fever or chills.  no recent trauma.  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  There is no aortic aneurysm, dissection, or other pathology to explain the infarct.   PAST MEDICAL/SURGICAL HISTORY:  Past Medical History:   MI - Myocardial Infarct:   ALLERGIES:  Allergies:  vancomycin: Hypotension  HOME MEDICATIONS:  Home Medications: Medication Instructions Status  atorvastatin 40 mg oral tablet 1 tab(s) orally once a day (at bedtime) Active  magnesium oxide 400 mg (241.3 mg elemental magnesium) oral tablet 1 tab(s) orally 2 times a day Active  Klor-Con 10 oral tablet, extended release 1 tab(s) orally 2 times a day Active  acetaminophen 325 mg oral tablet 2 tab(s) orally every 4 hours, As Needed - for Pain or fever Active  clopidogrel 75 mg oral tablet 1 tab(s) orally once a day Active  carvedilol 3.125 mg oral tablet 1 tab(s) orally 2 times a day Active  furosemide 40 mg oral tablet 1 tab(s) orally once a day Active  lisinopril 10 mg oral tablet 1 tab(s) orally once a day Active  metformin 500 mg oral tablet 1 tab(s) orally 2 times a day Active  Ranexa 1000 mg oral tablet, extended release 1 tab(s) orally 2 times a day Active  Combigan 0.2%-0.5% ophthalmic solution 1 drop(s) to left eye 2 times a day Active  Imdur 30 mg oral tablet, extended release 1 tab(s) orally once a day (in the morning) Active  Aspirin Low Dose 81 mg oral delayed release tablet 1 tab(s) orally once a day Active  folic acid 0.8 mg oral tablet 1 tab(s) orally once a day Active  dorzolamide ophthalmic  2% ophthalmic solution 1 drop(s) to left eye 2 times a day Active   Family and Social History:  Family History Coronary Artery Disease  Diabetes Mellitus   Social History negative tobacco, negative ETOH   Place of Living Home   Review of Systems:  Subjective/Chief Complaint LUQ/chest pain, SOB   Fever/Chills No   Cough Yes   Sputum No   Abdominal Pain Yes   Diarrhea No   Constipation No   Nausea/Vomiting No   SOB/DOE Yes   Chest Pain Yes   Telemetry Reviewed NSR   Dysuria No   Tolerating PT Yes   Tolerating Diet Yes   Medications/Allergies Reviewed Medications/Allergies reviewed   Physical Exam:  GEN well developed, well nourished   HEENT pink conjunctivae, hearing intact to voice   NECK No masses  trachea midline   RESP clear BS  no use of accessory muscles   CARD regular rate  no JVD   VASCULAR ACCESS none   ABD denies tenderness  no liver/spleen enlargement  no rebound or guarding.   GU no superpubic tenderness   LYMPH negative neck, negative axillae   EXTR negative cyanosis/clubbing, negative edema   SKIN normal to palpation, No rashes, No ulcers   NEURO cranial nerves intact, motor/sensory function intact   PSYCH alert, A+O to time, place, person   LABS:  Laboratory Results: LabObservation:    25-Jul-14 09:43, CT PE Chest/Abd Pelvis  W  OBSERVATION   Reason for Test (1) LUQ pain; (2) LLQ pain    25-Jul-14 20:57, Echo Doppler  OBSERVATION   Reason for Test  Hepatic:    25-Jul-14 07:34, Comprehensive Metabolic Panel  Bilirubin, Total 0.8  Alkaline Phosphatase 93  SGPT (ALT) 21  SGOT (AST) 35  Total Protein, Serum 7.9  Albumin, Serum 3.1  Routine Chem:    25-Jul-14 07:34, B-Type Natriuretic Peptide [facility])  B-Type Natriuretic Peptide [facility]) 425  Result(s) reported on 16 Sep 2012 at [facility].    25-Jul-14 07:34, Comprehensive Metabolic Panel  Glucose, Serum 143  BUN 27  Creatinine (comp) 1.28  Sodium, Serum 132  Potassium,  Serum 5.1  Chloride, Serum 103  CO2, Serum 22  Calcium (Total), Serum 9.5  Osmolality (calc) 272  eGFR (African American) 51  eGFR (Non-African American) 44  eGFR values <43m/min/1.73 m2 may be an indication of chronic  kidney disease (CKD).  Calculated eGFR is useful in patients with stable renal function.  The eGFR calculation will not be reliable in acutely ill patients  when serum creatinine is changing rapidly. It is not useful in   patients on dialysis. The eGFR calculation may not be applicable  to patients at the low and high extremes of body sizes, pregnant  women, and vegetarians.  Result Comment   POTASSIUM/AST - Slight hemolysis, interpret results with   - caution.   Result(s) reported on 16 Sep 2012 at 07:53AM.  Anion Gap 7    25-Jul-14 07:34, Lipase  Lipase 48  Result(s) reported on 16 Sep 2012 at 08:09AM.    26-Jul-14 017:61 Basic Metabolic Panel (w/Total Calcium)  Glucose, Serum 136  BUN 28  Creatinine (comp) 1.65  Sodium, Serum 133  Potassium, Serum 3.9  Chloride, Serum 100  CO2, Serum 23  Calcium (Total), Serum 9.1  Anion Gap 10  Osmolality (calc) 274  eGFR (African American) 37  eGFR (Non-African American) 32  eGFR values <662mmin/1.73 m2 may be an indication of chronic  kidney disease (CKD).  Calculated eGFR is useful in patients with stable renal function.  The eGFR calculation will not be reliable in acutely ill patients  when serum creatinine is changing rapidly. It is not useful in   patients on dialysis. The eGFR calculation may not be applicable  to patients at the low and high extremes of body sizes, pregnant  women, and vegetarians.    26-Jul-14 03:33, Hemoglobin A1c ([facility])  Hemoglobin A1c ([facility]) 6.9  The American Diabetes Association recommends that a primary goal of  therapy should be <7% and that physicians should reevaluate the  treatment regimen in patients with HbA1c values consistently >8%.    26-Jul-14 03:33, Lipid Profile ([facility])   Cholesterol, Serum 110  Triglycerides, Serum 69  HDL (INHOUSE) 47  VLDL Cholesterol Calculated 14  LDL Cholesterol Calculated 49  Result(s) reported on 17 Sep 2012 at 04:33AM.    26-Jul-14 03:33, Magnesium, Serum  Magnesium, Serum 1.6  1.8-2.4  THERAPEUTIC RANGE: 4-7 mg/dL  TOXIC: > 10 mg/dL   -----------------------  Cardiac:    25-Jul-14 07:34, Troponin I  Troponin I 0.04  0.00-0.05  0.05 ng/mL or less: NEGATIVE   Repeat testing in 3-6 hrs   if clinically indicated.  >0.05 ng/mL: POTENTIAL   MYOCARDIAL INJURY. Repeat   testing in 3-6 hrs if   clinically indicated.  NOTE: An increase or decrease   of 30% or more on serial   testing suggests a   clinically important change  Routine Coag:    25-Jul-14 07:34, D-Dimer, Quantitative  D-Dimer, Quantitative 3.93  If the D-dimer test is being used to assist in the exclusion of DVT  and/or PE, note the following:  In various studies concerning the  D-dimer methodology (STA Liatest) in use by this laboratory, it has  been reported that with a cut-off value of 0.50 ug/mL FEU, the   negative predictive value regarding the exclusion of thrombosis is  within the 95-100% range.  In patients with high pre-test probability  of DVT/PE the results of the D-dimer test should be correlated with  other diagnostic and clinical assessment modalities."  Reference: [employer]., 2005.  Routine Hem:    25-Jul-14 07:34, CBC Profile  WBC (CBC) 10.5  RBC (CBC) 4.39  Hemoglobin (CBC) 12.6  Hematocrit (CBC) 37.4  Platelet Count (CBC) 235  MCV 85  MCH 28.7  MCHC 33.8  RDW 14.6  Neutrophil % 74.2  Lymphocyte % 15.6  Monocyte % 8.6  Eosinophil % 1.0  Basophil % 0.6  Neutrophil # 7.8  Lymphocyte # 1.6  Monocyte # 0.9  Eosinophil # 0.1  Basophil # 0.1  Result(s) reported on 16 Sep 2012 at 07:53AM.    26-Jul-14 03:33, CBC Profile  WBC (CBC) 8.0  RBC (CBC) 4.38  Hemoglobin (CBC) 12.5  Hematocrit (CBC) 37.8  Platelet Count  (CBC) 218  MCV 86  MCH 28.5  MCHC 33.0  RDW 14.3  Neutrophil % 57.5  Lymphocyte % 26.7  Monocyte % 11.3  Eosinophil % 3.9  Basophil % 0.6  Neutrophil # 4.6  Lymphocyte # 2.1  Monocyte # 0.9  Eosinophil # 0.3  Basophil # 0.0  Result(s) reported on 17 Sep 2012 at 04:29AM.   RADIOLOGY:  Radiology Results: XRay:    05-Sep-13 15:17, Chest PA and Lateral  Chest PA and Lateral  REASON FOR EXAM:    Chest Pain  COMMENTS:       PROCEDURE: DXR - DXR CHEST PA (OR AP) AND LATERAL  - Oct 29 2011  3:17PM     RESULT:     Comparison is made to the study of 22 September 2008.    CABG changes are present. The cardiac silhouette is within normal limits.   There is no edema, infiltrate, effusion or pneumothorax. The bony and   mediastinal structures are otherwise unremarkable.    IMPRESSION:  No acute cardiopulmonary disease. Other changes as discussed   above.  Dictation Site: 6      Thank you for this opportunity to contribute to the care of your patient.           Verified By: Sundra Aland, M.D., MD    08-Sep-13 01:56, Chest Portable Single View  Chest Portable Single View  REASON FOR EXAM:    intubation  COMMENTS:       PROCEDURE: DXR - DXR PORTABLE CHEST SINGLE VIEW  - Nov 01 2011  1:56AM     RESULT: Comparison is made to the study of October 29, 2011.    The lungs are less well inflated today. The interstitial markings are   increased. The perihilar lung markings are prominent. The cardiac   silhouette is top normal in size. The pulmonary vascularity is indistinct   and engorged. The endotracheal tube tip lies approximately 1 cm above the   carina. The patient has undergone previous CABG.    IMPRESSION:    1. There has been interval deterioration in the appearance of the  chest     with increased interstitial edema and bibasilar atelectasis.   2. The endotracheal tube tip lies approximately 1 cm above the carina and   withdrawal by approximately 2 cm is recommended  to avoid right mainstem   bronchus intubation.     Dictation Site: 1          Verified By: DAVID A. Martinique, M.D., MD    08-Sep-13 06:14, Chest Portable Single View  Chest Portable Single View  REASON FOR EXAM:    line placement  COMMENTS:       PROCEDURE: DXR - DXR PORTABLE CHEST SINGLE VIEW  - Nov 01 2011  6:14AM     RESULT: Comparison is made to the study of earlier this same day.    The endotracheal tube tip lies approximately 1 cm above the carina. There   has been interval placement of a nasogastric tube whose tip projects off   the film. There is also been placement of a right internal jugular venous   catheter whose tip lies in the region of the junction of the SVC with the   right atrium. The pulmonary interstitial markings remain increased. The   pulmonary vascularity remains engorged increased density at the lung   bases persists.    IMPRESSION:   1. There has been interval placement of a nasogastric tube and of a right   internal jugular venous catheter.  2. Persistent increased interstitial density is noted bilaterally.  3. Withdrawal of the endotracheal tube x 2 cm is recommended to avoid   accidental right mainstem bronchus intubation.     Dictation Site: 1          Verified By: DAVID A. Martinique, M.D., MD    09-Sep-13 06:59, Chest Portable Single View  Chest Portable Single View  REASON FOR EXAM:    looking for poneumonia  COMMENTS:       PROCEDURE: DXR - DXR PORTABLE CHEST SINGLE VIEW  - Nov 02 2011  6:59AM     RESULT: Comparison is made to study of 01 November 2011 at 6:00 a.m.    The lungs are reasonably well inflated. Theendotracheal tube tip lies   just above the crotch of the carina. The right internal jugular venous   catheter tip lies in the region of the distal SVC. There is an   esophagogastric tube present whose tip appears to lie below the GE   junction. The cardiac silhouette is mildly enlarged. The interstitial   markings are increased  but improved. The left hemidiaphragm remains   obscured. Coarse lung markings at the right lung base have improved   slightly.  IMPRESSION:  There is a somewhat improved appearance of the lungs with   less interstitial edema but there remains density in the retrocardiac   region and in the right infrahilar region consistent with atelectasis or   pneumonia. Withdrawal of the endotracheal tube by 2 to 3 cm is   recommended to avoid accidental right mainstem bronchus intubation.     Dictation Site: 1          Verified By: DAVID A. Martinique, M.D., MD    10-Sep-13 06:41, Chest Portable Single View  Chest Portable Single View  REASON FOR EXAM:    ON VENT  COMMENTS:       PROCEDURE: DXR - DXR PORTABLE CHEST SINGLE VIEW  - Nov 03 2011  6:41AM     RESULT: Comparison is made to study of 02 November 2011.    The endotracheal tube tip lies approximately 1 cm below the inferior   margin of the clavicular heads an approximately 2 cm above the carina. A   right internal jugular venous catheter tip lies in the region of the   junction of the SVC with the right atrium. The nasogastric tube tip   appears to lie below the GE junction but the proximal port is not clearly   evident and may be at or slightly above the GE junction. Advancement of   the nasogastric tube by 5 cm is recommended.     The cardiac silhouette is enlarged. The pulmonary vascularity is mildly   engorged. Density at both lung bases persists but has slightly improved   since yesterday's study.    IMPRESSION:    1.The findings are consistent with CHF with pulmonary interstitial edema.   There may have been slight interval improvement since yesterday's study.  2. Advancement of the nasogastric tube by approximately 5 cm what assure   that the proximal port lies below the GE junction.     Dictation Site: 2          Verified By: DAVID A. Martinique, M.D., MD    11-Sep-13 05:46, Chest Portable Single View  Chest Portable Single  View  REASON FOR EXAM:    pna  COMMENTS:       PROCEDURE: DXR - DXR PORTABLE CHEST SINGLE VIEW  - Nov 04 2011  5:46AM     RESULT: Comparison: 11/03/2011    Findings:  The endotracheal tube terminates just above the carina. The enteric tube   courses subdiaphragmatically beyond the field-of-view. Right IJ central   venous catheter tip overlies the right atrium. Heart and mediastinum are   stable. Prior median sternotomy and CABG. Bilateral interstitial   opacities are similar to prior, likely secondary to pulmonary edema. Mild   basilar opacities are likely secondary to atelectasis, also similar to   prior.  IMPRESSION:   Interstitial pulmonary edema, similar to prior.          Verified By: Gregor Hams, M.D., MD    12-Sep-13 06:10, Chest Portable Single View  Chest Portable Single View  REASON FOR EXAM:    ON VENT  COMMENTS:       PROCEDURE: DXR - DXR PORTABLE CHEST SINGLE VIEW  - Nov 05 2011  6:10AM     RESULT: Endotracheal tube and NG tube in good position. Prior CABG.   Cardiomegaly and pulmonary vascular prominence with bilateral   interstitial prominence and pleural effusions. These findings are most   consistent with congestive heart failure. These findings have progressed   slightly from 11/04/2011.    IMPRESSION:  Slight progression of findings of congestive heart failure  and interstitial edema. Stable tube positions.        Verified By: Osa Craver, M.D., MD    14-Sep-13 07:01, Chest Portable Single View  Chest Portable Single View  REASON FOR EXAM:    on vent  COMMENTS:       PROCEDURE: DXR - DXR PORTABLE CHEST SINGLE VIEW  - Nov 07 2011  7:01AM     RESULT: Comparison: 11/05/2011    Findings:  Right IJ central venous catheter has been withdrawn and now terminates   over the superior aspect of the SVC. Remainder of the support apparatus   is stable. Prior median sternotomy and CABG. The heart and mediastinum   are stable. There are bilateral  interstitial opacities,  similar to prior.   There is a possible small left pleural effusion.    IMPRESSION:   Interstitial pulmonary edema is similar to prior. Possible small left   pleural effusion.          Verified By: Gregor Hams, M.D., MD    15-Sep-13 13:22, Chest Portable Single View  Chest Portable Single View  REASON FOR EXAM:    respiratory failure  COMMENTS:       PROCEDURE: DXR - DXR PORTABLE CHEST SINGLE VIEW  - Nov 08 2011  1:22PM     RESULT: Comparison: 11/07/2011    Findings:  The heart and mediastinum are stable. Endotracheal tube terminates likely   just below the thoracic inlet given the relative kyphotic positioning.   Right IJ central venous catheter tip terminates in the SVC. Endotracheal   tube terminates subdiaphragmatically, beyond the field-of-view. Mild   bilateral interstitial opacities are slightly decreased from prior.   Retrocardiac opacity is decreased.    IMPRESSION:   Interval slight decrease in interstitial pulmonary edema.      Dictation Site: 8          Verified By: Gregor Hams, M.D., MD    17-Sep-13 05:50, Chest Portable Single View  Chest Portable Single View  REASON FOR EXAM:    on  vent  COMMENTS:       PROCEDURE: DXR - DXR PORTABLE CHEST SINGLE VIEW  - Nov 10 2011  5:50AM     RESULT: Comparison is made to prior study dated 11/08/2011.    Findings: A right-sided central venous catheter is appreciated withtip   projecting in the region of the superior vena cava. An endotracheal tube   is identified with tip at the level of the clavicles. An NG tube is seen   with tip not on the view of this study. The patient is status post median   sternotomy and coronary artery bypass grafting. The cardiac silhouette is   moderately enlarged. There is mild prominence of the interstitial   markings. The visualized bony skeleton is grossly unremarkable. Surgical   clips project within the right axilla.   A vertically oriented area of  increased density projects within the   region of the lingula. This may represent discoid atelectasis.    IMPRESSION:      1. Likely discoid atelectasis within the lingula. An early or mild   infiltrate cannot be completely excluded.  2. The study is degraded by shallow inspiration which accentuates the   interstitial findings.  3. Support lines and tubes as described above.     Thank you for the opportunity to contribute to the care of your patient.           Verified By: Mikki Santee, M.D., MD    18-Sep-13 08:44, Chest Portable Single View  Chest Portable Single View  REASON FOR EXAM:    pulmonary edema  COMMENTS:       PROCEDURE: DXR - DXR PORTABLE CHEST SINGLE VIEW  - Nov 11 2011  8:44AM     RESULT: Comparison: 11/10/2011    Findings:  Right IJ central venous catheter tip overlies the SVC. Endotracheal tube   andenteric tube are no longer seen. The heart and mediastinum are   stable. Prior median sternotomy. Interstitial opacities are similar to   prior.    IMPRESSION:   1. Interstitial opacities are similar to prior, likely interstitial     pulmonary edema.  2. Interval extubation and removal of  the enteric tube.    Dictation site: 2          Verified By: Gregor Hams, M.D., MD    18-Sep-13 13:58, Abdomen Flat and Erect  Abdomen Flat and Erect  REASON FOR EXAM:    vomiting:portable  COMMENTS:       PROCEDURE: DXR - DXR ABDOMEN 2 V FLAT AND ERECT  - Nov 11 2011  1:58PM     RESULT: Comparison: None.    Findings:  No definite free intraperitoneal air. No the lung bases are clear. Air   seen within nondilated small and large bowel.    IMPRESSION:   Nonobstructed bowel gas pattern.    Dictation site: 2    Verified By: Gregor Hams, M.D., MD    05-Dec-13 05:56, Chest Portable Single View  Chest Portable Single View  REASON FOR EXAM:    chest pain  COMMENTS:       PROCEDURE: DXR - DXR PORTABLE CHEST SINGLE VIEW  - Jan 28 2012  5:56AM      RESULT: Comparison: 11/11/2011    Findings:  Right IJ central venous catheter has been removed. The heart and   mediastinum are stable. Prior median sternotomy. There are mild bilateral   interstitial opacities. Surgical clips overlie the right axilla.    IMPRESSION:   Mild interstitial edema.  Dictation site: 2        Verified By: Gregor Hams, M.D., MD    25-Jul-14 07:49, Chest PA and Lateral  Chest PA and Lateral  REASON FOR EXAM:    chest pain  COMMENTS:       PROCEDURE: DXR - DXR CHEST PA (OR AP) AND LATERAL  - Sep 16 2012  7:49AM     RESULT: Comparison is made to the study of January 28, 2012.    The lungs are adequately inflated. The interstitial markings are mildly   prominent. The cardiac silhouette is normal in size. The patient has   undergone previous CABG. There are surgical clips in the periphery of the   right upper hemithorax.    IMPRESSION:  There is no evidence of pneumonia. There is minimal  prominence of the interstitial markings markings which is not entirely   new. There is no focal pneumonia.   Dictation Site: 2        Verified By: DAVID A. Martinique, M.D., MD  [place]:    05-Dec-13 06:39, US Abdomen Limited Survey  US Abdomen Limited Survey  REASON FOR EXAM:    RUQ/right shoulder pain and nausea  COMMENTS:   Body Site: GB and Fossa, CBD, Head of Pancreas    PROCEDURE: [place]  - US ABDOMEN LIMITED SURVEY  - Jan 28 2012  6:39AM     RESULT: Comparison: None    Technique: Multiple gray-scale andcolor-flow Doppler images of the right   upper quadrant are presented for review.    Findings:    Visualized portions of the liver demonstrate normal echogenicity and   normal contours. The liver is without evidence of a focal hepatic lesion.   Thereare cholelithiasis. There is no intra- or extrahepatic biliary   ductal dilatation. The common duct measures 4.8 mm in maximal diameter.   There is no gallbladder wall thickening, pericholecystic fluid, or    sonographic Murphy's sign.     The pancreas is not visualized secondary to overlying bowel gas.    IMPRESSION:     Cholelithiasis without sonographic evidence of acute cholecystitis.  Dictation Site: 1        Verified By: Jennette Banker, M.D., MD  LabUnknown:    05-Dec-13 05:56, Chest Portable Single View  PACS Image    05-Dec-13 06:39, US Abdomen Limited Survey  PACS Image    05-Dec-13 07:40, Urine Culture  CEFOXITIN    25-Jul-14 07:49, Chest PA and Lateral  PACS Image    25-Jul-14 09:43, CT PE Chest/Abd Pelvis W  PACS Image  CT:  CT PE Chest/Abd Pelvis W  REASON FOR EXAM:    (1) LUQ pain; (2) LLQ pain  COMMENTS:       PROCEDURE: CT  - CT PE CHEST/ ABD/PELVIS WITH  - Sep 16 2012  9:43AM     RESULT: CT NECK, CHEST, ABDOMEN, AND PELVIS    History: Left upper quadrant pain    Comparison: None    Technique: Multiple axial images obtained from the lung apices to the   pubic symphysis, without p.o. contrast and with  100 ml of Isovue 370   intravenous contrast. 3-D reconstructions and MIP images were performed   of the chest on a Siemens work station.  Findings:    CHEST:    There is adequate opacification of the pulmonary arteries. There is no   pulmonary embolus. The main pulmonary artery, right main pulmonary   artery, and left main pulmonary arteries are normal in size. The heart   size is normal. There is no pericardial effusion. Prior CABG.    The lungs are clear. There bilateral emphysematous changes. There are a   few subpleural right lung nodules the largest measuring 7 mm in the right   upper lung. There is no focal consolidation, pleural effusion, or   pneumothorax. There is mild irregularity of the right side of the carina   which may represent mucous. This can be followed up at the same time as   the pleural based nodules.  There is no axillary, hilar, or mediastinal adenopathy.    The osseous structures are  unremarkable.      ABDOMEN/PELVIS:    The liver demonstrates no focal abnormality. There is no intrahepatic or   extrahepatic biliary ductal dilatation. The gallbladder is unremarkable.   There is a low attenuation bandlike area within the spleen. The kidneys,   adrenal glands, pancreas are normal. The bladder is unremarkable.     The stomach, duodenum, small intestine, and large intestine demonstrate   no dilatation. There is a normal caliber appendix in the right lower   quadrant without periappendiceal inflammatory changes. There is no     pneumoperitoneum, pneumatosis, or portal venous gas. There is no   abdominal or pelvic free fluid. There is no lymphadenopathy.     The abdominal aorta is normal in caliber with atherosclerosis.    The osseous structures are unremarkable.    IMPRESSION:     1. No CT evidence of pulmonary embolus.    2.  There is a low attenuation bandlike area within the spleen which may   reflect laceration secondary to trauma of indeterminate age versus   infarct versus infiltrating process. There is no perisplenic hematoma.   Correlate with clinical exam and laboratory values.  3.  There are a few subpleural right lung nodules the largest measuring 7   mm in the right upper lung.  Followup CT chest is recommended in 3 months.    4.  There is mild irregularity of the right side of the carina which may  represent mucous. This can be followed up at the same time as the pleural   based nodules.      Dictation Site: 1        Verified By: Jennette Banker, M.D., MD  [facility]:    24-Oct-13 18:16, Screening Digital Mammogram  Screening Digital Mammogram  REASON FOR EXAM:    SCR MAMMO NO ORDER  COMMENTS:       PROCEDURE: MAM - MAM DGTL SCRN MAM NO ORDER W/CAD  - Dec 17 2011  6:16PM     RESULT: No mass. No pathologic calcification. Benign calcification.   Stable nodular parenchymal pattern which is heterogeneously dense. CAD   evaluation  nonfocal.    IMPRESSION:  Stable benign exam.    BI-RADS: Category 2- Benign Finding     A NEGATIVE MAMMOGRAM REPORT DOES NOT PRECLUDE BIOPSY OR OTHER EVALUATION   OF A CLINICALLY PALPABLE OR OTHERWISE SUSPICIOUS MASS OR LESION. BREAST     CANCER MAY NOT BE DETECTED IN UP TO 10% OF CASES.     Thank you for the oppurtunity to contribute to the care of your patient.          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   Plan CT done with PE protocol suggests splenic infarct.  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  There is no aortic aneurysm, dissection, or other pathology to explain the infarct.  The most likely source is a cardiac source of the embolus.  Cardiac work up ongoing.  This is a small infarct and it in and of itself does not likely require any treatment or anticoagulation.  If cardiac issues found, that may require anticoagulation.  small risk of abscess or infection from the infarct discussed.  Much of the spleen appears viable.  her exam and clinical appearance is quite benign.   No other recs at this point Please call with questions.   level 4   Electronic Signatures: Algernon Huxley (MD)  (Signed 26-Jul-14 13:28)  Authored: Chief Complaint and History, PAST MEDICAL/SURGICAL HISTORY, ALLERGIES, HOME MEDICATIONS, Family and Social History, Review of Systems, Physical Exam, LABS, RADIOLOGY, Assessment and Plan   Last Updated: 26-Jul-14 13:28 by Algernon Huxley (MD)

## 2014-06-24 NOTE — Discharge Summary (Signed)
PATIENT NAME:  Toni Marshall, Toni Marshall MR#:  [id number] DATE OF BIRTH:  07-11-1945  DATE OF ADMISSION:  04/05/2014 DATE OF DISCHARGE:  04/06/2014  ADMITTING DIAGNOSIS: Posterior chest pain.  DISCHARGE DIAGNOSES:  1.  Posterior chest pain of unclear etiology at this time. Negative cardiac enzymes for injury, status post Myoview without reversible ischemia and ejection fraction of 50%. 2.  Acute on chronic diastolic congestive heart failure, resolved.  3.  Chronic renal insufficiency.  4.  Diabetes mellitus type 2 with hemoglobin A1c 6.6. 5.  History of hypertension, hyperlipidemia, diabetes mellitus, rheumatoid arthritis, coronary artery disease status post coronary artery bypass grafting x2,   DISCHARGE CONDITION: Stable.  DISCHARGE MEDICATIONS: The patient is to continue Klor-Con 10 mEq twice daily, Plavix 75 mg p.o. daily, carvedilol 3.125 mg twice daily, lisinopril 10 mg p.o. daily, Ranexa 1 gram twice daily, Imdur 30 mg p.o. daily, aspirin 81 mg p.o. daily, Lipitor 80 mg p.o. at bedtime, Lasix 20 mg p.o. daily, meclizine 25 mg 3 times daily as needed, Brimonidine ophthalmic solution 1 drop to each affected eye twice daily, left eye, tramadol 50 mg p.o. at bedtime, timolol ophthalmic solution 0.5% one drop to each affected eye twice daily, glipizide 2.5 mg p.o. daily, folic acid 0.4 mg daily.   HOME OXYGEN: None.   DISCHARGE DIET: 2 gram salt, low-fat, low-cholesterol, carbohydrate -controlled diet, regular consistency.   ACTIVITY LIMITATIONS: As tolerated.   DISCHARGE FOLLOWUP: Follow up appointment with Dr. Letta Pate in 2 days after discharge, Dr. Mariah Milling in 1 week after discharge.   CONSULTANTS: Care management, social work, Mr. Eula Listen PA for Dr. Kirke Corin, and Dr. Kirke Corin.    RADIOLOGIC STUDIES: Chest x-ray, portable single view, 11th of February 2016, revealed vascular congestion, mild increased interstitial markings, may reflect mild interstitial edema.  Myoview stress test 12th of February  2016 showed very poor study due to intense GI uptake. Overall this was a moderate risk scan. Pharmacological myocardial profusion study with region of severe scar in anterior and apical territory. There is evidence of prior LAD infract as well as significant ischemia. The estimated ejection fraction was 50%. Left ventricular global function was mildly reduced. There were no EKG changes concerning for ischemia. There is GI uptake artifact noted on this study, read by Dr. Lorine Bears.  HISTORY AND HOSPITAL COURSE: The patient is a 69 year old African American female with history of coronary artery disease who presented to the hospital with complaints of pains in the chest and between her shoulder blades. Please refer to Dr. Arlys John admission note on the 11th of February 2016.   On arrival to the hospital, the patient's vitals: Temperature was 97.9, pulse was 78, respiration rate was 18, blood pressure 136/80 and saturation was 92% on room air. Physical exam revealed diminished breath sounds bilaterally.   The patient's EKG showed normal sinus rhythm with fusion complexes at 81 beats, normal axis, possible left atrial enlargement, nonspecific ST-T changes. The patient's lab data, done on admission, showed elevated BUN and creatinine of 26 and 1.52. The patient's glucose level 126, otherwise BMP was unremarkable. Hemoglobin A1c was checked and was found to be 6.6. Liver enzymes revealed albumin level of 2.9. Cardiac enzymes x4 were within normal limits. TSH was normal at 2.68. CBC was within normal limits. Coagulation panel was unremarkable. Urinalysis was normal.   The patient was admitted to the hospital for further evaluation. She was initiated on higher doses of diuretics and Myoview stress test was ordered for her after consultation with  Dr. Kirke Corin and Mr. Eula Listen. The patient's pain overall subsided as time progressed with medical therapy. The patient underwent Myoview stress test on the 12th of  February 2016, which was felt to be negative for acute or inducible ischemia. Dr. Kirke Corin felt that the patient should continue her usual medications and follow-up with Dr. Mariah Milling for further recommendations.   In regards to acute on chronic diastolic CHF, the patient was maximally diuresed with high doses of diuretics and after diuresis her kidney function somewhat slightly worsened. On the day of discharge, 12th of February 2016, the patient's BUN and creatinine were 35 and 1.78, respectively. The patient was advised to continue low doses of diuretics as well as ACE inhibitor and Coreg. She is to follow up with her primary physician, Dr. Letta Pate, in the next few days after discharge and have her BMP repeated to ensure kidney function stability. However, if the patient's kidney function remains abnormal, the patient may benefit from higher doses of diuretics depending on the patient's congestive heart failure compensation.   By the day of discharge, 12th of February 2016, the patient did not complain of any significant discomfort or shortness of breath. She felt better overall. Her vitals were stable. Temperature was 97.3, pulse was 65, respiration rate was 18, blood pressure 106/71 and saturation was 94% on room air at rest. The patient is being discharged with the above-mentioned medications and follow-up.   In regards to her chronic medical problems such as hypertension, hyperlipidemia, diabetes mellitus and coronary artery disease the patient is to continue her outpatient management. No changes to medications were made at present.   TIME SPENT: 40 minutes.  ____________________________ Katharina Caper, MD rv:sb D: 04/06/2014 16:10:25 ET T: 04/06/2014 16:35:39 ET JOB#: [id number]  cc: Katharina Caper, MD, <Dictator> Ngwe A. Letta Pate, MD Antonieta Iba, MD Katharina Caper MD ELECTRONICALLY SIGNED 04/28/2014 12:50

## 2014-06-24 NOTE — H&P (Signed)
PATIENT NAME:  Toni Marshall, Toni Marshall MR#:  [id number] DATE OF BIRTH:  1945/06/12  DATE OF ADMISSION:  04/19/2014  PRIMARY CARE PHYSICIAN:  Dr. Letta Pate.  REFERRING PHYSICIAN:  Dr. Mindi Junker.   CHIEF COMPLAINT: Shortness of breath today.   HISTORY OF PRESENT ILLNESS: A 69 year old African American female with a hospital CHF, hypertension, and diabetes came to the ED due to shortness of breath today. The patient is alert, awake, oriented, and in no acute distress.  Actually the patient was just discharged one week ago after treatment of chest pain and CHF exacerbation. The patient got a stress test which was negative. The patient was fine after discharge  until today, the patient started to have shortness of breath on exertion. The patient also has a mild cough without sputum. The patient denies any orthopnea, nocturnal dyspnea, but has leg edema. The patient gained 2 pounds in the past one week. The patient denies any other symptoms.    PAST MEDICAL HISTORY: Hypertension, diabetes, hyperlipidemia, chronic systolic CHF, CAD, rheumatoid arthritis, and MI.   PAST SURGICAL HISTORY:  Coronary artery bypass graft twice.   SOCIAL HISTORY: No smoking, alcohol drinking or illicit drugs.   FAMILY HISTORY: Mother MI at age 42. Father had diabetes.   ALLERGIES:  To vancomycin.    HOME MEDICATIONS:   1.  Tramadol 50 mg p.o. q.6 hours p.r.n.  2.  Ranexa 1000 mg p.o. b.i.d. 3.  Nitroglycerin 0.4 mg transdermal film one patch transdermal once a day.  4.  Naproxen 500 mg p.o. b.i.d.  5.  Meclizine 25 mg p.o. t.i.d. p.r.n. dizziness.   6. Magnesium oxide 400 mg p.o. daily.  7.  Lisinopril 10 mg p.o. daily.  8.  Coronal 10 mg p.o. b.i.d.  9.  Imdur 30 mg p.o. daily. 10.  Glipizide 2.5 mg p.o. daily. 11.  Lasix 20 mg p.o. daily. 12.  Folic acid 0.4 mg p.o. daily. 13.  Plavix 75 mg p.o. daily.  14.  Coreg 3.125 mg p.o. b.i.d.  15.  Calcium 600+ D one cap b.i.d.  16.  Brimonidine ophthalmic solution one drop to  each effected eye twice a day.  17.  Atorvastatin 80 mg p.o. at bedtime.  18.  Aspirin 81 mg p.o. daily.   REVIEW OF SYSTEMS:  The patient denies any fever or chills. No headache or dizziness. No weakness.  EYES: No double vision or blurred vision.  EARS, NOSE, THROAT: No postnasal drip, slurred speech or dysphagia.  CARDIOVASCULAR: No chest pain, palpitation, orthopnea, or nocturnal dyspnea, but has leg edema.  PULMONARY: Positive for cough sputum. No wheezing, but has shortness of breath. No hemoptysis.  GASTROINTESTINAL: No abdominal pain, nausea, vomiting, diarrhea. No melena or bloody stool.  GENITOURINARY: No dysuria, hematuria, or incontinence.  SKIN: No rash or jaundice.  NEUROLOGY: No syncope, loss of consciousness, or seizure.  ENDOCRINOLOGY: No polyuria, polydipsia, heat or cold intolerance.  HEMATOLOGY: No easy bruising or bleeding.   PHYSICAL EXAMINATION:  VITAL SIGNS: Temperature 97.6, blood pressure 131/76, pulse 77, oxygen saturation 96% on room air.  GENERAL: The patient is awake, oriented, in no acute distress.  HEENT: Pupils round, equal and reactive to light and accommodation. Moist oral mucosa. Clear oropharynx.  NECK: Supple. No JVD or carotid or bruits are noted. No rubs. No pain. No thyromegaly.  CARDIOVASCULAR: S1 and S2. Regular rate and rhythm. No murmurs or gallops.  PULMONARY: Bilateral air entry, no wheezing, but has basilar rales. No use of accessory muscle to breathe.  ABDOMEN: Soft. No distention. No tenderness. No organomegaly. Bowel sounds present.  EXTREMITIES: Bilateral lower extremity edema and 1+.  No clubbing or cyanosis. No calf tenderness. Bilateral pedal pulses present.  SKIN: No rash or jaundice.  NEUROLOGY: Alert and oriented x3. No focal deficit. Power 5/5. Sensation intact.   LABORATORY DATA: Glucose 144. BNP 616, BUN 27, creatinine 1.83. Electrolytes are normal. CBC in normal range. Chest x-ray showed CHF. Troponin 0.04. EKG normal sinus  rhythm at 69 BPM.   IMPRESSIONS:  1.  Acute on chronic systolic congestive heart failure.  2.  Acute respiratory failure with hypoxia. The patient's oxygen saturation decreased to have 88% in the ED.  3.  Hypertension.  4.  Diabetes.  5.  Chronic kidney disease stage III.  6.  Coronary artery disease.   PLAN OF TREATMENT:  1.  The patient will be admitted to a telemetry floor with telemetry monitor. I will start CHF protocol.  The patient was treated with one dose Lasix at 80 mg IV in the ED, I would continue 40 mg IV b.i.d., but closely monitor BMP.  May have to adjust the dose, depending on the patient's renal function.  2.  Coronary artery disease we will continue aspirin Plavix and atorvastatin.  3.  For diabetes, we will start a sliding scale.  4.  I discussed the patient's condition and plan of treatment with the patient.   CODE STATUS: The patient wants full code.   TIME SPENT: About 56 minutes.   ____________________________ Shaune Pollack, MD qc:at D: 04/19/2014 16:40:58 ET T: 04/19/2014 17:08:13 ET JOB#: [id number]  cc: Shaune Pollack, MD, <Dictator> Shaune Pollack MD ELECTRONICALLY SIGNED 04/21/2014 19:53

## 2014-06-24 NOTE — Consult Note (Signed)
PATIENT NAME:  Toni Marshall, HOPWOOD MR#:  [id number] DATE OF BIRTH:  1945-04-12   DATE OF CONSULTATION:  04/21/2014  REFERRING PHYSICIAN:   CONSULTING PHYSICIAN:  Cammy Copa, MD   REASON FOR CONSULTATION: Evaluate because of dysphagia and odynophagia starting this morning.  HISTORY OF PRESENT ILLNESS: The patient is a 69 year old, African American female who has had congestive heart failure, hypertension, and diabetes. She came in with some shortness of breath, coughing up some mucus.  She has not a lot of sinus infection, but she does complain of postnasal drainage that has been there, but she does not spit any of it out.  She feels like her throat was really dry last night.  She has been on some nasal O2 since being here. She has had some dyspnea with exertion while being here and showed decreased O2 sats while up walking around.  She has been in the hospital for 2 days and been on nasal oxygen since that time, has not been on it at home.  She can get some food down, although it is a little bit sore when she swallows.  She has not had any hoarseness or change in her voice.  She feels like she can breathe through her nose okay.   PAST MEDICAL HISTORY: Significant for hypertension, diabetes, hyperlipidemia, chronic systolic congestive heart failure, coronary artery disease, rheumatoid arthritis and MI.   PAST SURGICAL HISTORY: She has had coronary artery bypass grafting x 2.   SOCIAL HISTORY: She does not smoke. She does not drink alcohol or use illicit drugs.   FAMILY HISTORY: Her father had diabetes. Mother an MI at age 25.   DRUG ALLERGIES: VANCOMYCIN.   CURRENT MEDICATIONS: I reviewed these as noted in the chart.   PHYSICAL EXAMINATION: The patient is alert, very cooperative and a good historian.  The nose has nasal cannulas in it; you can see some blood staining anteriorly in the nose on both sides. The oropharynx shows upper and lower dentures that are good fitting. The posterior pharynx  is clear. There is no redness of the pharynx or exudate, but I can see a little bit of dried blood on the posterior pharyngeal wall.  The neck is negative for any nodes or masses.   Flexible laryngoscopy is done, this is dictated in detail elsewhere. This shows irritation and some bleeding that has occurred in the anterior septum on both sides with little strings of dried blood on the floor of the nose going back, but no signs of obstruction. There is no evidence for sinusitis at all. The nasopharynx has a little bit of dried blood but no signs of drainage. The hypopharynx shows a dry blood in the posterior hypopharyngeal wall. Vocal cords are pearly white, move well, open widely. There is no redness here or signs of subglottic swelling or edema. There is no sign of lesions in larynx or hypopharynx, piriform sinuses, vallecula and epiglottis are all normal.   IMPRESSION: The patient has dryness in the front of the nose with epistaxis secondary to nasal cannulas.  We will get humidification on the nasal cannula and get a room humidifier as well.  The hypopharynx does not show any signs of infection whatsoever. There are no signs of drainage or sinus infection that may be causing the drainage.  The drainage is more likely from mouth breathing and having mucus that runs down the back of her throat every morning once the patient awakens and tries to breathe through her nose again.  The dryness and the mucous membranes in the hypopharynx is likely causing some of the irritation in her throat and a room humidifier will probably help that some as well.  She may have sleep apnea leading to the mouth breathing, and she is a good candidate for a sleep study to evaluate for obstructive sleep apnea. She does not have any signs of allergic reaction, swelling or infection in her hypopharynx or larynx.  No specific ENT follow-up is necessary right now unless she has further worsening of symptoms.       _________________________ Cammy Copa, MD phj:DT D: 04/21/2014 12:20:55 ET T: 04/21/2014 13:15:11 ET JOB#: [id number]  cc: Cammy Copa, MD, <Dictator> Cammy Copa MD ELECTRONICALLY SIGNED 05/06/2014 11:23

## 2014-06-24 NOTE — Consult Note (Signed)
General Aspect primary cardiologist: Dr. Rockey Situ.  Toni Marshall is a very pleasant 69 year old woman with history of coronary artery disease, bypass surgery in 2002, repeat bypass November 2012 at [facility], diabetes, hypertension, hyperlipidemia, obesity with non-STEMI 10/29/2011 with presentation to [facility].  She presentedwith worsening exertional dyspnea.  she was hospitalized on 04/05/2014 for chest pain. She ruled out for MI, had stress test showing no significant ischemia. Significant artifact noted. Symptoms were felt to be somewhat atypical in nature. she continued to have atypical back and right sided chest pain. She saw Dr. Rockey Situ recently with no significant change in symptoms. Previous long complicated hospital course with acute respiratory failure, bibasilar pneumonia. She is on a ventilator and required prolonged resuscitation.  Catheterization in 10/30/2011 showed severe three-vessel coronary artery disease, patent vein graft to the RCA, occluded vein graft to the OM and LIMA to the LAD. Attempted LAD PCI which was unsuccessful.?? Left main is moderately calcified, 99% mid LAD followed by a 90% lesion, 50% distal LAD disease, diagonal #2 had 90% ostial disease, 90% mid circumflex, proximal 60% RCA disease, LIMA graft with 100% stenosis at the graft ostium, saphenous vein graft to the OM with 100% ostial graft disease, vein graft to the distal RCA showing 30% proximal graft disease, 40% mid graft disease, ejection fraction 40%  Previous Echocardiogram showing ejection fraction 40-45%, apical akinesis with severe distal anterior and distal inferior wall hypokinesis, mild MR echocardiogram in the hospital 09/16/2012 showed ejection fraction 50-55%, otherwise normal study. she reports worsening dyspnea. She usually takes furosemide 20 mg every few days but not on a regular basis. She does not follow a low-sodium diet or the time. She was found to have mildly elevated BNP at 600 with pulmonary  vascular congestion.   Physical Exam:  GEN no acute distress   HEENT jugular venous pressure is not well visualized   RESP normal resp effort  few bibasilar crackles   CARD Regular rate and rhythm  No murmur   ABD denies tenderness   LYMPH negative neck   EXTR negative cyanosis/clubbing, positive edema, trace edema   PSYCH alert, A+O to time, place, person   Review of Systems:  Subjective/Chief Complaint shortness of breath and right-sided atypical chest pain.   General: Fatigue  Weakness   Skin: No Complaints   ENT: No Complaints   Eyes: No Complaints   Neck: No Complaints   Respiratory: No Complaints   Cardiovascular: No Complaints   Gastrointestinal: No Complaints   Genitourinary: No Complaints   Vascular: No Complaints   Musculoskeletal: mid back pain between shoulder blades   Neurologic: Dizzness   Hematologic: No Complaints   Endocrine: No Complaints   Psychiatric: No Complaints   Review of Systems: All other systems were reviewed and found to be negative   Medications/Allergies Reviewed Medications/Allergies reviewed   Family & Social History:  Family and Social History:  Family History Coronary Artery Disease  mother: cad s/p mi   Social History negative tobacco, negative ETOH, negative Illicit drugs   + Tobacco Prior (greater than 1 year)  7.5 pack years   Place of Living Home   Home Medications: Medication Instructions Status  atorvastatin 80 mg oral tablet 1 tab(s) orally once a day (at bedtime) Active  furosemide 20 mg oral tablet 1 tab(s) orally once a day Active  meclizine 25 mg oral tablet 1 tab(s) orally 3 times a day, As Needed for dizziness Active  brimonidine ophthalmic 0.2% ophthalmic solution 1 drop(s) to each  affected eye 2 times a day in left eye Active  glipiZIDE 2.5 mg oral tablet, extended release 1 tab(s) orally once a day Active  folic acid 0.4 mg oral tablet 1 tab(s) orally once a day Active  traMADol 50 mg oral  tablet 1 tab(s) orally every 6 hours, As Needed Active  magnesium oxide 400 mg oral tablet 1 tab(s) orally once a day Active  naproxen 500 mg oral tablet 1 tab(s) orally 2 times a day Active  Calcium 600+D 1 cap(s) orally 2 times a day Active  nitroglycerin 0.4 mg/hr transdermal film, extended release 1 patch transdermal once a day Active  Klor-Con 10 oral tablet, extended release 1 tab(s) orally 2 times a day Active  clopidogrel 75 mg oral tablet 1 tab(s) orally once a day Active  carvedilol 3.125 mg oral tablet 1 tab(s) orally 2 times a day Active  lisinopril 10 mg oral tablet 1 tab(s) orally once a day Active  Ranexa 1000 mg oral tablet, extended release 1 tab(s) orally 2 times a day Active  Imdur 30 mg oral tablet, extended release 1 tab(s) orally once a day (in the morning) Active  Aspirin Low Dose 81 mg oral delayed release tablet 1 tab(s) orally once a day Active   Lab Results: Hepatic:  25-Feb-16 13:09   Bilirubin, Total 0.6  Alkaline Phosphatase 74  SGPT (ALT)  11  SGOT (AST) 24  Total Protein, Serum 7.8  Albumin, Serum 3.4  26-Feb-16 04:44   Bilirubin, Total 0.5  Alkaline Phosphatase 61  SGPT (ALT) 18  SGOT (AST) 27  Total Protein, Serum 7.2  Albumin, Serum  2.9  Cardiology:  25-Feb-16 16:18   Ventricular Rate 69  Atrial Rate 69  P-R Interval 196  QRS Duration 88  QT 444  QTc 475  P Axis 28  R Axis -11  T Axis 99  ECG interpretation Normal sinus rhythm T wave abnormality, consider lateral ischemia Prolonged QT Abnormal ECG When compared with ECG of 05-Apr-2014 04:08, No significant change was found ----------unconfirmed---------- Confirmed by OVERREAD, NOT ([id number]), editor PEARSON, BARBARA ([id number]) on 04/20/2014 9:37:03 AM  Routine Chem:  25-Feb-16 13:09   Glucose, Serum  144  BUN  27  Creatinine (comp)  1.83  Sodium, Serum 140  Potassium, Serum 4.1  Chloride, Serum 106  CO2, Serum 24  Calcium (Total), Serum 9.3  Osmolality (calc) 287  eGFR (African  American)  35  eGFR (Non-African American)  29 (eGFR values <103m/min/1.73 m2 may be an indication of chronic kidney disease (CKD). Calculated eGFR, using the MRDR Study equation, is useful in  patients with stable renal function. The eGFR calculation will not be reliable in acutely ill patients when serum creatinine is changing rapidly. It is not useful in patients on dialysis. The eGFR calculation may not be applicable to patients at the low and high extremes of body sizes, pregnant women, and vegetarians.)  Anion Gap 10  B-Type Natriuretic Peptide ([facility])  616 (Result(s) reported on 19 Apr 2014 at 02:00PM.)  26-Feb-16 04:44   Glucose, Serum 96  BUN  35  Creatinine (comp)  2.09  Sodium, Serum 140  Potassium, Serum 4.4  Chloride, Serum 106  CO2, Serum 24  Calcium (Total), Serum 8.8  Osmolality (calc) 287  eGFR (African American)  30  eGFR (Non-African American)  25 (eGFR values <65mmin/1.73 m2 may be an indication of chronic kidney disease (CKD). Calculated eGFR, using the MRDR Study equation, is useful in  patients with stable renal function. The  eGFR calculation will not be reliable in acutely ill patients when serum creatinine is changing rapidly. It is not useful in patients on dialysis. The eGFR calculation may not be applicable to patients at the low and high extremes of body sizes, pregnant women, and vegetarians.)  Anion Gap 10  Cardiac:  25-Feb-16 13:09   Troponin I 0.05 (0.00-0.05 0.05 ng/mL or less: NEGATIVE  Repeat testing in 3-6 hrs  if clinically indicated. >0.05 ng/mL: POTENTIAL  MYOCARDIAL INJURY. Repeat  testing in 3-6 hrs if  clinically indicated. NOTE: An increase or decrease  of 30% or more on serial  testing suggests a  clinically important change)    15:21   Troponin I 0.04 (0.00-0.05 0.05 ng/mL or less: NEGATIVE  Repeat testing in 3-6 hrs  if clinically indicated. >0.05 ng/mL: POTENTIAL  MYOCARDIAL INJURY. Repeat  testing in 3-6 hrs if   clinically indicated. NOTE: An increase or decrease  of 30% or more on serial  testing suggests a  clinically important change)  Routine UA:  25-Feb-16 13:09   Color (UA) Yellow  Clarity (UA) Clear  Glucose (UA) Negative  Bilirubin (UA) Negative  Ketones (UA) Negative  Specific Gravity (UA) 1.011  Blood (UA) Negative  pH (UA) 6.0  Protein (UA) Negative  Nitrite (UA) Negative  Leukocyte Esterase (UA) 1+ (Result(s) reported on 19 Apr 2014 at 01:58PM.)  RBC (UA) 1 /HPF  WBC (UA) 3 /HPF  Bacteria (UA) NONE SEEN  Epithelial Cells (UA) 5 /HPF  Mucous (UA) PRESENT (Result(s) reported on 19 Apr 2014 at 01:58PM.)  Routine Hem:  25-Feb-16 13:09   WBC (CBC) 7.1  RBC (CBC) 4.95  Hemoglobin (CBC) 14.1  Hematocrit (CBC) 44.0  Platelet Count (CBC) 226 (Result(s) reported on 19 Apr 2014 at 01:26PM.)  MCV 89  MCH 28.5  MCHC 32.0  RDW 14.5   EKG:  EKG Interp. by me   Interpretation NSR, 81, nonspecific anterior  st/t changes, TWI V2.no significant changes from before.    vancomycin: Hypotension  Vital Signs/Nurse's Notes: **Vital Signs.:   26-Feb-16 11:30  Vital Signs Type Routine  Temperature Temperature (F) 97.9  Celsius 36.6  Temperature Source oral  Pulse Pulse 77  Respirations Respirations 18  Systolic BP Systolic BP 93  Diastolic BP (mmHg) Diastolic BP (mmHg) 60  Mean BP 71  Pulse Ox % Pulse Ox % 94  Pulse Ox Activity Level  At rest  Oxygen Delivery 3L    Impression 69 year old female with history of CAD, 3 vessel bypass surgery in 2002, repeat bypass November 2012 at [facility], non-STEMI 10/29/2011 (unable to perform intervention), diabetes, hypertension, hyperlipidemia, and obesity who presented to [facility] on 2/10 with a 3 day history of dyspnea and continued atypical chest pain.  1. dyspnea: I suspect that this is likely multifactorial. She does seem to have mild fluid overload but that doesn't seem to be severe enough to explain all her symptoms. She improved partially  with diuresis. I agree with switching to by mouth Lasix. She might just require 40 mg once daily as she was only taking 20 mg every few days. I had a prolonged discussion with her about the importance of low sodium diet. - recent cardiac evaluation included a nuclear stress test which showed prior anterior infarct without significant ischemia. Ejection fraction is low normal.  2. CAD: -Status post CABG as above -Continue Imdur 30 mg daily at this time, could increase if needed -Continue Ranexa 1000 mg bid -Continue Plavix 75 mg, aspirin  81 mg, Coreg 3.125 mg bid, lisinopril 5 mg . continue medical therapy.  3.  HLD: -Continue Lipitor 80 mg  4.. HTN: -Controlled -Continue current medications  5. Possible sleep apnea: She has symptoms suggestive of sleep apnea which might be contributing to heart failure. I recommend outpatient sleep study.   Electronic Signatures: Kathlyn Sacramento (MD)  (Signed [phone] 18:30)  Authored: General Aspect/Present Illness, History and Physical Exam, Review of System, Family & Social History, Home Medications, Labs, EKG , Allergies, Vital Signs/Nurse's Notes, Impression/Plan   Last Updated: 26-Feb-16 18:30 by Kathlyn Sacramento (MD)

## 2014-06-24 NOTE — Consult Note (Signed)
General Aspect Primary Cardiologist: Dr. Rockey Situ, MD ____________  69 year old female with history of CAD, 3 vessel bypass surgery in 2002, repeat bypass November 2012 at [facility] [place] 10/29/2011 (unable to perform intervention), diabetes, hypertension, hyperlipidemia, and obesity who presented to [facility] on 2/10 with a 3 day history of dizziness and a one day history of mid back pain between her shoulder blades.  ___________  PMH: 1. CAD, 3 vessel bypass surgery in 2002, repeat bypass November 2012 at [facility], non-STEMI 10/29/2011 (unable to perform intervention) 2. DM2 3. HTN 4. HLD 5. Obesity ___________   Present Illness 69 year old female with the above problem list who presented to [facility] on 2/10 with the above complaint. She has known CAD s/p 3 vessel CABG in 2002 (LIMA-LAD, VG-OM1, and VG-dRCA). She underwent repeat CABG at [facility] in 2012. On 10/29/2011 she suffered a NSTEMI. Cardiac cath at that time showed severe three-vessel coronary artery disease, patent vein graft to the RCA, occluded vein graft to the OM and LIMA to the LAD. Attempted LAD PCI which was unsuccessful.?? Left main is moderately calcified, 99% mid LAD followed by a 90% lesion, 50% distal LAD disease, diagonal #2 had 90% ostial disease, 90% mid circumflex, proximal 60% RCA disease, LIMA graft with 100% stenosis at the graft ostium, saphenous vein graft to the OM with 100% ostial graft disease, vein graft to the distal RCA showing 30% proximal graft disease, 40% mid graft disease, ejection fraction 40%. Echo 09/16/2012 showed ejection fraction 50-55%, otherwise normal study. Echo prior to this study showed ejection fraction 40-45%, apical akinesis with severe distal anterior and distal inferior wall hypokinesis, mild MR. She has been managed well on Imdur 30 mg daily, Ranexa 1000 mg bid, nitro patch daily, aspirin 81 mg daily, Plavix 75 mg daily, Lipitor 80 mg qhs, Coreg 3.125 mg bid, Lasix 20 mg every other day, and lisinopril 5 mg  daily.   She called our office on 2/8 noting dizziness. EMS was at her house reporting normal vital signs. She denied any anginal symptoms. She declined to go to the hospital. She was advised to increase her water intake. She saw [facility] outpatient clinic on 2/9 and was diagnosed with vertigo. She picked up medication on 2/10 and started this yesterday. Overnight on 2/10 she developed mid back pain between her shoulder blades. Pain does not radiate. No associated symptoms such as diaphoresis, SOB, nausea, vomiting, presyncope, or syncope. She denies any exertional symptoms. She has continued to have the dizziness intermittently, most recently while in the ED for a couple of minutes. She denies any symptoms of the room spinning. Her weight has been stable. She notes occasional lower extremity edema during the day, but denies any LEE at night. She has been taking her medications daily as directed.   Upon her arrival to [facility] her troponins have been negative x 2, CXR showed vascular congestion. SCr stable at 1.52. pBNP 279. She is currently chest pain and dizziness free.   Physical Exam:  GEN no acute distress, obese   HEENT hearing intact to voice   NECK supple   RESP normal resp effort  clear BS  decreased breath sounds bilaterally   CARD Regular rate and rhythm  No murmur   ABD denies tenderness  soft   EXTR negative edema   SKIN normal to palpation   NEURO cranial nerves intact   PSYCH alert   Review of Systems:  General: Fatigue  Weakness   Skin: No Complaints  ENT: No Complaints   Eyes: No Complaints   Neck: No Complaints   Respiratory: No Complaints   Cardiovascular: No Complaints   Gastrointestinal: No Complaints   Genitourinary: No Complaints   Vascular: No Complaints   Musculoskeletal: mid back pain between shoulder blades   Neurologic: Dizzness   Hematologic: No Complaints   Endocrine: No Complaints   Psychiatric: No Complaints   Review of Systems: All  other systems were reviewed and found to be negative   Medications/Allergies Reviewed Medications/Allergies reviewed   Family & Social History:  Family and Social History:  Family History mother: cad s/p mi   Social History negative ETOH, negative Illicit drugs   + Tobacco Prior (greater than 1 year)  7.5 pack years   Place of Living Home     MI - Myocardial Infarct:    Hypertension:    Diabetes Mellitus, Type II (NIDD):    cardiac bi pass:    Carotid Stent Placement:   Home Medications: Medication Instructions Status  Imdur 30 mg oral tablet, extended release 1 tab(s) orally once a day (in the morning) Active  Aspirin Low Dose 81 mg oral delayed release tablet 1 tab(s) orally once a day Active  Klor-Con 10 oral tablet, extended release 1 tab(s) orally 2 times a day Active  clopidogrel 75 mg oral tablet 1 tab(s) orally once a day Active  carvedilol 3.125 mg oral tablet 1 tab(s) orally 2 times a day Active  lisinopril 10 mg oral tablet 1 tab(s) orally once a day Active  Ranexa 1000 mg oral tablet, extended release 1 tab(s) orally 2 times a day Active  atorvastatin 80 mg oral tablet 1 tab(s) orally once a day (at bedtime) Active  furosemide 20 mg oral tablet 1 tab(s) orally once a day Active  meclizine 25 mg oral tablet 1 tab(s) orally 3 times a day, As Needed for dizziness Active  brimonidine ophthalmic 0.2% ophthalmic solution 1 drop(s) to each affected eye 2 times a day in left eye Active  traMADol 50 mg oral tablet 1 tab(s) orally once a day (at bedtime) Active  timolol ophthalmic maleate 0.5% ophthalmic solution 1 drop(s) to each affected eye 2 times a day Active  glipiZIDE 2.5 mg oral tablet, extended release 1 tab(s) orally once a day Active  folic acid 0.4 mg oral tablet 1 tab(s) orally once a day Active   Lab Results:  Thyroid:  11-Feb-16 04:06   Thyroid Stimulating Hormone 2.68 (0.45-4.50 (IU = International Unit)  ----------------------- Pregnant patients  have  different reference  ranges for TSH:  - - - - - - - - - -  Pregnant, first trimetser:  0.36 - 2.50 uIU/mL)  Hepatic:  11-Feb-16 04:06   Bilirubin, Total 0.5  Alkaline Phosphatase 83  SGPT (ALT) 20  SGOT (AST) 26  Total Protein, Serum 7.7  Albumin, Serum  2.9  Routine Chem:  11-Feb-16 04:06   Hemoglobin A1c ([facility])  6.6 (The American Diabetes Association recommends that a primary goal of therapy should be <7% and that physicians should reevaluate the treatment regimen in patients with HbA1c values consistently >8%.)  Glucose, Serum  126  BUN  26  Creatinine (comp)  1.52  Sodium, Serum 141  Potassium, Serum 4.0  Chloride, Serum  110  CO2, Serum 21  Calcium (Total), Serum 8.8  Osmolality (calc) 288  eGFR (African American)  44  eGFR (Non-African American)  36 (eGFR values <8m/min/1.73 m2 may be an indication of chronic kidney disease (CKD). Calculated  eGFR, using the MRDR Study equation, is useful in  patients with stable renal function. The eGFR calculation will not be reliable in acutely ill patients when serum creatinine is changing rapidly. It is not useful in patients on dialysis. The eGFR calculation may not be applicable to patients at the low and high extremes of body sizes, pregnant women, and vegetarians.)  Anion Gap 10  Cardiac:  11-Feb-16 04:06   Troponin I 0.05 (0.00-0.05 0.05 ng/mL or less: NEGATIVE  Repeat testing in 3-6 hrs  if clinically indicated. >0.05 ng/mL: POTENTIAL  MYOCARDIAL INJURY. Repeat  testing in 3-6 hrs if  clinically indicated. NOTE: An increase or decrease  of 30% or more on serial  testing suggests a  clinically important change)  CK, Total 86  CPK-MB, Serum 2.5 (Result(s) reported on 05 Apr 2014 at 05:13AM.)    06:54   Troponin I 0.04 (0.00-0.05 0.05 ng/mL or less: NEGATIVE  Repeat testing in 3-6 hrs  if clinically indicated. >0.05 ng/mL: POTENTIAL  MYOCARDIAL INJURY. Repeat  testing in 3-6 hrs if  clinically  indicated. NOTE: An increase or decrease  of 30% or more on serial  testing suggests a  clinically important change)  Routine Coag:  11-Feb-16 04:06   Prothrombin 14.7 (11.4-15.0 NOTE: New Reference Range  03/23/14)  INR 1.1 (INR reference interval applies to patients on anticoagulant therapy. A single INR therapeutic range for coumarins is not optimal for all indications; however, the suggested range for most indications is 2.0 - 3.0. Exceptions to the INR Reference Range may include: Prosthetic heart valves, acute myocardial infarction, prevention of myocardial infarction, and combinations of aspirin and anticoagulant. The need for a higher or lower target INR must be assessed individually. Reference: The Pharmacology and Management of the Vitamin K  antagonists: the seventh ACCP Conference on Antithrombotic and Thrombolytic Therapy. BMWUX.3244 Sept:126 (3suppl): [id number]. A HCT value >55% may artifactually increase the PT.  In one study,  the increase was an average of 25%. Reference:  "Effect on Routine and Special Coagulation Testing Values of Citrate Anticoagulant Adjustment in Patients with High HCT Values." American Journal of Clinical Pathology 2006;126:400-405.)  Routine Hem:  11-Feb-16 04:06   WBC (CBC) 7.6  RBC (CBC) 4.92  Hemoglobin (CBC) 14.2  Hematocrit (CBC) 43.5  Platelet Count (CBC) 238  MCV 88  MCH 28.8  MCHC 32.6  RDW 14.2  Neutrophil % 59.3  Lymphocyte % 24.9  Monocyte % 11.8  Eosinophil % 3.5  Basophil % 0.5  Neutrophil # 4.5  Lymphocyte # 1.9  Monocyte # 0.9  Eosinophil # 0.3  Basophil # 0.0 (Result(s) reported on 05 Apr 2014 at 04:42AM.)   EKG:  EKG Interp. by me   Interpretation NSR, 81, nonspecific inferior st/t changes, TWI V2   Radiology Results: XRay:    11-Feb-16 04:32, Chest Portable Single View  Chest Portable Single View   REASON FOR EXAM:    Chest pain  COMMENTS:       PROCEDURE: DXR - DXR PORTABLE CHEST SINGLE VIEW  -  Apr 05 2014  4:32AM     CLINICAL DATA:  Acute onset of mid chest pain.  Initial encounter.    EXAM:  PORTABLE CHEST - 1 VIEW    COMPARISON:  Chest radiograph performed 09/16/2012, and CT of the  chest performed 04/14/2013    FINDINGS:  The lungs are well-aerated. Vascular congestion is noted. Mildly  increased interstitial markings may reflect mild interstitial edema.  No definite pleural effusion or pneumothorax  is seen.    The cardiomediastinal silhouette is borderline normal in size. The  patient is status post median sternotomy, with evidence of prior  CABG. No acute osseous abnormalities are seen. Scattered clips are  seen overlying the right upper lung zone.     IMPRESSION:  Vascular congestion noted. Mildly increased interstitial markings  may reflect mild interstitial edema.      Electronically Signed    By: Garald Balding M.D.    On: 04/05/2014 04:47         Verified By: JEFFREY . CHANG, M.D.,    vancomycin: Hypotension  Vital Signs/Nurse's Notes: **Vital Signs.:   11-Feb-16 09:30  Vital Signs Type Admission  Temperature Temperature (F) 97.4  Celsius 36.3  Temperature Source oral  Pulse Pulse 67  Respirations Respirations 18  Systolic BP Systolic BP 338  Diastolic BP (mmHg) Diastolic BP (mmHg) 82  Mean BP 95  Pulse Ox % Pulse Ox % 93  Pulse Ox Activity Level  At rest  Oxygen Delivery Room Air/ 21 %    Impression 69 year old female with history of CAD, 3 vessel bypass surgery in 2002, repeat bypass November 2012 at [facility], non-STEMI 10/29/2011 (unable to perform intervention), diabetes, hypertension, hyperlipidemia, and obesity who presented to [facility] on 2/10 with a 3 day history of dizziness and a one day history of mid back pain between her shoulder blades.   1. Posterior chest pain: -Uncertain etiology at this time, troponin negative x 2 at current time -Last ischemic evaluation 10/2011 showed severe 3 vessel CAD with occluded LIMA-LAD (unable to cross  lesion in 2013), occluded SVG-OM, and patent SVG-dRCA (see details in HPI) EF by echo 40% - managed medically -She prefers no further ischemic evaluations at this time -CXR demonstrates vascular congestion, will check an echo to evaluate LV function, wall motion, and right sided pressures as a possible source for her pain -If echo is stable would continue diuresis, possibly increasing with monitoring of SCr (baseline approximately 1.41)  2. Dizziness: -Denies room spinning sensation -Monitoring on tele while inpatient, if no significant arrhythmias are seen could plan for 30 day outpatient monitor -Lytes, TSH, glucose all ok -Check orthostatics  3. CAD: -Status post CABG as above -Continue Imdur 30 mg daily at this time, could increase if needed -Continue Ranexa 1000 mg bid -Continue Plavix 75 mg, aspirin 81 mg, Coreg 3.125 mg bid, lisinopril 5 mg  4. HLD: -Continue Lipitor 80 mg  5. HTN: -Controlled -Continue current medications  6. DM   Electronic Signatures for Addendum Section:  Kathlyn Sacramento (MD) (Signed Addendum 11-Feb-16 11:34)  The patient was seen and examined. Agree with the above with the addition: she is well known to me . She is s/p CABG twice due to graft failure. Most recent cath by me in 2013 showed an occluded SVG to OM (chronic) and occluded free IMA to LAD with patent SVG to RCA. Attempted LAD PCI but was no successful due to inability to cross. This was attempted before at [facility] and was also no succesfull. She presented with vague symptoms of chest pain radiating to back with increased heartburn. Troponin is negative and ECG with no new changes. No murmurs.  Agree with echo.  I requested a Lexiscan nuclear stress test for tomorrow to make sure no ischemic in RCA distribution given that SVG to RCA is the only patent graft.   Electronic Signatures: Kathlyn Sacramento (MD)  (Signed 11-Feb-16 11:34)  Co-Signer: General Aspect/Present Illness, History and Physical  Exam,  Review of System, Family & Social History, Past Medical History, Home Medications, Labs, EKG , Radiology, Allergies, Vital Signs/Nurse's Notes, Impression/Plan Rise Mu (PA-C)  (Signed 11-Feb-16 10:34)  Authored: General Aspect/Present Illness, History and Physical Exam, Review of System, Family & Social History, Past Medical History, Home Medications, Labs, EKG , Radiology, Allergies, Vital Signs/Nurse's Notes, Impression/Plan   Last Updated: 11-Feb-16 11:34 by Kathlyn Sacramento (MD)

## 2014-06-24 NOTE — H&P (Signed)
PATIENT NAME:  Toni Marshall, Toni Marshall MR#:  [id number] DATE OF BIRTH:  [birth year]  DATE OF ADMISSION:  04/05/2014  PRIMARY CARE PHYSICIAN: Dr. Letta Pate from [facility]  CARDIOLOGIST: Dr. Mariah Milling.  HISTORY OF PRESENT ILLNESS: The patient is a 69 year old African American female with past medical history significant for history of diabetes mellitus, history of coronary artery disease who presented to the hospital with complaints of posterior chest discomfort. According to the patient, she was doing well up until tonight when she was asleep and she woke up with chest pains in his back, in between her shoulder blades, across her back. Initially it felt as if it was acid reflux. It was achy, intermittent pain accompanied by some shortness of breath. Pain did not worsen with exertion.   PAST MEDICAL HISTORY: Significant for hypertension, hyperlipidemia, diabetes mellitus type 2, rheumatoid arthritis, chronic systolic CHF, coronary artery disease status post coronary artery bypass grafting x2 in 2002 and return in 2012, and myocardial infarction.   ALLERGIES: VANCOMYCIN.   SOCIAL HISTORY: No tobacco, alcohol or drug abuse. Lives in her home.    FAMILY HISTORY: The patient's mother had MI at age of 53, father had diabetes.   OUTPATIENT MEDICATIONS: Unfortunately, not reconciled, but in the past the patient was apparently on acetaminophen/oxycodone 325 mg/5 mg 1 tablet every 6 hours as needed, aspirin 81 mg p.o. daily, atorvastatin 80 mg p.o. daily, Brimonidine ophthalmic solution 0.2% twice daily to left eye, carvedilol 3.125 mg twice daily, Plavix 75 mg p.o. daily, Combivent 0.2/0.5 mg 1 drop to left eye twice daily, dorzolamide  2% ophthalmic solution to left eye twice daily, folic acid 0.8 mg p.o. daily, furosemide 20 mg p.o. daily, glipizide 2.5 mg p.o. daily, Imdur 30 mg p.o. daily, potassium chloride 10 mEq one tablet twice daily, lisinopril 10 mg p.o. daily, meclizine 25 mg 3 times daily as needed, metformin  500 mg p.o. twice daily, Ranexa 1000 mg twice daily, Slow-Mag 119/71.5 mg once daily, timolol ophthalmic solution to each effected eye twice daily, tramadol 50 mg p.o. at bedtime.   REVIEW OF SYSTEMS: Positive for stable weight at around 225 pounds, some pain in the back which started today, admits of having chills for the past 1 week on and off, admits of glaucoma as well as cataracts, left eye operation approximately 4 years ago, admits of snoring, post nasal drip. She is supposed to get obstructive sleep apnea evaluation, but has not done yet. Admits of having some sinus congestion as well as postnasal drip, at nighttime feeling like choking, admits of intermittent wheezing in her lungs, shortness of breath especially on exertion, admits of some orthopnea, sleep disordered orthopnea, as well as lower extremity edema which seems to be chronic. Admits of palpitations in her heart on exertion, also constipation with some rectal bleeding which is minimal. CONSTITUTIONAL: Denies any fevers. Admits to chills. Denies any fatigue or weakness. Denies any weight  loss or gain.  EYES: Denies any blurry vision, double vision. Admits of cataract.  EARS, NOSE, AND THROAT: Denies any tinnitus, allergies, epistaxis, sinus pain, dentures, or difficulty swallowing RESPIRATORY; Denies any cough, hemoptysis, asthma, COPD. CARDIOVASCULAR: Denies chest pains in the middle of her chest anteriorly. No arrhythmias or syncope.  GASTROINTESTINAL: Denies any nausea, vomiting, jaundice or change in bowel habits.  GENITOURINARY: Denies dysuria, hematuria, frequency, incontinence. ENDOCRINOLOGY: Denies polydipsia, nocturia, thyroid problems, heat or cold intolerance or thirst. HEMATOLOGIC: Denies anemia, easy bruising, bleeding or swollen glands. SKIN: Denies acne, rashes, lesions or change in  moles.  MUSCULOSKELETAL: Denies arthritis, cramps, swelling.  NEUROLOGIC: Denies numbness, epilepsy or tremor.  PSYCHIATRIC: Denies  anxiety, insomnia or depression.   PHYSICAL EXAMINATION: VITAL SIGNS: On arrival to the hospital, the patient's temperature was 97.9, pulse was 78, respiratory rate 18, blood pressure 136/80, and saturation was 92% on room air.  GENERAL: This is a well-developed, well-nourished, obese African American female in no significant distress, sitting on the stretcher.  HEENT: Pupils are equal, reactive to light. Extraocular movements intact. No icterus or conjunctivitis. Has normal hearing. No pharyngeal erythema. Mucosa is moist.  NECK: No masses. Supple and nontender. Thyroid not enlarged. No adenopathy, JVD or carotid bruits bilaterally. Full range of motion. The patient's neck is thick and difficult to evaluate her for JVD.  LUNGS: Clear to auscultation, but diminished breath sounds bilaterally. No rales, rhonchi or wheezing. No labored inspirations, increased effort, dullness to percussion or overt respiratory distress.  HEART: S1 and S2. Rhythm was regular, PMI not lateralized. Chest is nontender to palpation. 1+ pedal pulses. 1+ lower extremity edema.  EXTREMITIES: No calf tenderness or cyanosis was noted.  ABDOMEN: Soft, nontender. Bowel sounds are present. No hepatosplenomegaly or masses were noted.  RECTAL: Deferred.  MUSCLE STRENGTH: Able to move all extremities. No cyanosis, degenerative joint disease or kyphosis. Gait was not tested.  SKIN: Did not show any rashes, lesions, erythema, nodularity, or induration. It was warm and dry to palpation.  LYMPHATIC: No adenopathy in the cervical region.  NEUROLOGIC: Cranial nerves grossly intact. Sensory is intact. No dysarthria or aphasia.  PSYCHIATRIC: The patient is alert, oriented to time, person and place, cooperative. Memory is good. No significant confusion, agitation, or depression noted.   DIAGNOSTIC DATA: EKG showed sinus rhythm with fusion complexes at 81 beats per minute. Normal axis. Possible left atrial enlargement. Nonspecific ST changes  were noted.   BMP showed BUN and creatinine of 26 and 1.52, glucose 126. B-type natriuretic peptide was 279. Otherwise, BMP was unremarkable. The patient's liver enzymes revealed albumin level of 2.9. Cardiac enzymes x2 were within normal limits. CBC within normal limits. The patient's coagulation panel was within normal limits.   Chest x-ray, portable single view, 11th of February 2016, revealed vascular congestion, mildly increased interstitial markings which may reflect mild interstitial edema.   ASSESSMENT AND PLAN: 1.  Posterior chest pain, questionable unstable angina. Admit the patient to overflow, continue her on beta blockers, aspirin, nitroglycerin as well as heparin subcutaneously. We will get Dr. Mariah Milling to see the patient for consultation.  2.  Congestive heart failure, acute on chronic, likely diastolic as the patient's most recent echocardiogram in 2014 revealed normal ejection fraction. We will continue the patient on Lasix, ACE inhibitor, as well as Coreg watching her kidney function closely as well as her blood pressure.  3.  Renal insufficiency, chronic. Follow up with diuresis.  4.  Diabetes mellitus. Get hemoglobin A1c. Will continue her home medications as well as sliding scale insulin.   TIME SPENT: 50 minutes.   ____________________________ Katharina Caper, MD rv:sb D: 04/05/2014 09:06:02 ET T: 04/05/2014 09:41:33 ET JOB#: [id number]  cc: Katharina Caper, MD, <Dictator> Ngwe A. Letta Pate, MD Katharina Caper MD ELECTRONICALLY SIGNED 04/11/2014 12:24

## 2014-06-24 NOTE — Discharge Summary (Signed)
PATIENT NAME:  Toni Marshall, KIRKLAND MR#:  [id number] DATE OF BIRTH:  1945/04/07  DATE OF ADMISSION:  04/19/2014 DATE OF DISCHARGE:  04/23/2014  ADMITTING PHYSICIAN: Shaune Pollack, MD  DISCHARGING PHYSICIAN: Enid Baas, MD   PRIMARY CARE PHYSICIAN: Ngwe A. Letta Pate, MD   PRIMARY CARDIOLOGIST: Antonieta Iba, MD  CONSULTATIONS IN THE HOSPITAL: Cardiology consultation with Antonieta Iba, MD; ENT consultation by Cammy Copa, MD.  DISCHARGE DIAGNOSES:  1.  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  2.  Acute hypoxic respiratory failure.  3.  Chronic obstructive pulmonary disease.  4.  Possible sleep apnea.  5.  Hypertension.  6.  Coronary artery disease, status post bypass graft surgery.  7.  Diabetes mellitus.  8.  Obesity.  9.  Rheumatoid arthritis. 10.  Chronic kidney disease stage 3.  DISCHARGE HOME MEDICATIONS:  1.  Plavix 75 mg p.o. daily.  2.  Coreg 3.125 mg p.o. b.i.d.  3.  Lisinopril 10 mg p.o. daily.  4.  Ranexa 1000 mg p.o. b.i.d.  5.  Imdur 30 mg p.o. daily.  6.  Aspirin 81 mg p.o. daily.  7.  Atorvastatin 80 mg p.o. at bedtime.  8.  Meclizine 25 mg p.o. 3 times a day as needed for dizziness.  9. Brimonidine 0.2% ophthalmic solution 1 drop each eye twice a day in the left eye.  10.  Glipizide 2.5 mg p.o. daily. 11.  Folic acid 0.4 p.o. daily.  12.  Tramadol 50 mg p.o. q. 6 hours p.r.n. for pain.  13.  Magnesium oxide 400 mg p.o. daily.  14.  Calcium with vitamin D 1 capsule p.o. b.i.d.  15.  Nitroglycerin 0.4 mg per hour transdermal patch daily.  16.  Lasix 20 mg every other day.  17.  Symbicort 160/4.5 mcg 2 puffs twice a day.  18.  Mucinex 600 mg p.o. b.i.d. for 10 days.   DISCHARGE HOME OXYGEN: Three liters.   DISCHARGE DIET: Low-sodium diet.   DISCHARGE ACTIVITY: As tolerated.    FOLLOWUP INSTRUCTIONS:  1.  Outpatient sleep study.  2.  Cardiology followup in 1 week.  3.  PCP followup in 1 to 2 weeks.   LABORATORIES AND  IMAGING STUDIES PRIOR TO DISCHARGE: Sodium 135, potassium 4.6, chloride 105, bicarbonate 22, BUN 59, creatinine 1.95. Glucose 121 and calcium of 9.0   WBC 6.3, hemoglobin 13.3, hematocrit 42.6, platelet count 228,000.   CT of the chest without contrast showing diffuse interstitial changes, which are stable. No central vascular congestion, chronic emphysematous changes, and multiple subpleural nodules seen. CT of the neck ordered for dysphagia, showing no acute mucosal or submucosal lesions, severe emphysematous changes in the apices and subpleural nodules noted. Dense atherosclerotic calcifications noted. Degenerative changes in the C-spine.   Cardiac troponins remain negative. Urinalysis negative for any infection.   BRIEF HOSPITAL COURSE: Ms. Toni Marshall is a 69 year old obese African American female with multiple medical problems including CAD, status post bypass graft surgery in the past, history of N-STEMI in 2013 requiring ICU admission, cardiac catheterization revealing LAD lesion however medical management was recommended, systolic congestive heart failure and combined congestive heart failure with EF improved up to 60% at this time, diabetes, hypertension, arthritis, presents to the hospital from home secondary to worsening shortness of breath.  1.  Acute hypoxic respiratory failure secondary to acute on chronic combined CHF exacerbation. EF has improved and normalized at this point, has chronic cardiac disease and wall motion abnormalities. She was diuresed, however, her  creatinine started to go up, so cardiology  did not feel that it was all CHF. So, a CT chest without contrast was done; it showed COPD changes. The patient is started on inhalers. The patient likely has sleep apnea and will need a sleep study and CPAP as an outpatient. She qualifies for home oxygen and that also should help with her symptoms of dizziness and dyspnea. She needs to be on 3 liters continuous oxygen at this time. Her  symptoms have relatively improved and she is relatively stable at this time. She still has some dyspnea, saturations are fine, all the other workup is negative, so she is being discharged home with close outpatient followup. Medications have been adjusted. Lasix has been changed to every other day based on cardiology recommendations.  2.  CAD, status post CABG and chronic angina. All her home medications are being continued.  3.  COPD. Inhalers are added. 4.  Diabetes mellitus. Home medications are being continued.   Her course has been otherwise uneventful in the hospital.   DISCHARGE CONDITION: Stable.   DISCHARGE DISPOSITION: Home.   TIME SPENT ON DISCHARGE: Forty-five minutes.    ____________________________ Enid Baas, MD rk:TM D: 04/23/2014 15:34:29 ET T: 04/23/2014 22:37:48 ET JOB#: [id number]  cc: Enid Baas, MD, <Dictator> Antonieta Iba, MD Ngwe A. Letta Pate, MD Enid Baas MD ELECTRONICALLY SIGNED 05/10/2014 17:56

## 2014-06-24 NOTE — Op Note (Signed)
PATIENT NAME:  Toni Marshall, Toni Marshall MR#:  [id number] DATE OF BIRTH:  1945-08-22  DATE OF PROCEDURE:  04/21/2014  PREOPERATIVE DIAGNOSIS: Dysphagia due to his throat pain, he has difficulty swallowing.  POSTOPERATIVE DIAGNOSES:  1.  Dysphasia and throat symptoms secondary to dried mucous membranes and dried clots in the back.  2.  Epistaxis secondary to dryness in the nose.   OPERATIVE PROCEDURE: Flexible laryngoscopy.   ANESTHESIA: Topical.   DESCRIPTION OF PROCEDURE: The patient was given topical anesthesia by spraying the nose with 4% Xylocaine mixed with Afrin. The flexible scope was used to visualize both sides of the nose. There were dry, irritated mucous membranes anteriorly with signs of epistaxis on both sides of the anterior septum. A little bit of dried old strings of blood in the floor of the nose on both sides. A little bit of dried blood in the nasopharynx, but no lesions in the nasopharynx. It was passed down into the hypopharynx. There was some dried blood on the posterior pharyngeal wall. Mucous membranes are a little bit dry. Epiglottis normal size and shape. Vocal cords are pearly white, move well in the midline. They open widely. Subglottic space is clear. There is no redness in the laryngeal inlet and no sign of any lesions in the larynx or hypopharynx. The entire hypopharynx looks normal.   The patient tolerated the procedure well. This was done at the bedside. There were no operative complications.     ____________________________ Cammy Copa, MD phj:at D: 04/21/2014 12:14:05 ET T: 04/21/2014 18:48:13 ET JOB#: [id number]  cc: Cammy Copa, MD, <Dictator> Cammy Copa MD ELECTRONICALLY SIGNED 05/06/2014 11:23

## 2014-07-03 ENCOUNTER — Ambulatory Visit: Payer: Medicare PPO | Attending: Family Medicine

## 2014-07-03 DIAGNOSIS — G4733 Obstructive sleep apnea (adult) (pediatric): Secondary | ICD-10-CM | POA: Diagnosis not present

## 2014-07-10 ENCOUNTER — Ambulatory Visit
Admission: RE | Admit: 2014-07-10 | Discharge: 2014-07-10 | Disposition: A | Discharge status: Home / Self Care | Payer: Medicare PPO | Source: Ambulatory Visit | Attending: Family Medicine | Admitting: Family Medicine

## 2014-07-10 DIAGNOSIS — N63 Unspecified lump in breast: Secondary | ICD-10-CM | POA: Diagnosis present

## 2014-07-10 DIAGNOSIS — N6320 Unspecified lump in the left breast, unspecified quadrant: Secondary | ICD-10-CM

## 2014-07-13 ENCOUNTER — Ambulatory Visit: Payer: Medicare PPO | Admitting: Cardiovascular Disease

## 2014-07-26 ENCOUNTER — Other Ambulatory Visit: Payer: Self-pay | Admitting: Family

## 2014-07-26 ENCOUNTER — Other Ambulatory Visit: Payer: Self-pay

## 2014-07-26 MED ORDER — LISINOPRIL 5 MG PO TABS: 5.0000 mg | ORAL_TABLET | Freq: Every day | ORAL | Status: DC

## 2014-07-26 NOTE — Telephone Encounter (Signed)
Toni Marshall called she states she has enough of her lisinopril to last till Monday. She will need a prescription sent in for 5 mg, as the dose was decreased from 10 mg at her last visit.

## 2014-08-07 ENCOUNTER — Ambulatory Visit: Payer: Medicare PPO | Admitting: Family

## 2014-08-13 ENCOUNTER — Telehealth: Payer: Self-pay | Admitting: *Deleted

## 2014-08-13 NOTE — Telephone Encounter (Signed)
Pt would like some Renxa Samples

## 2014-08-13 NOTE — Telephone Encounter (Signed)
Spoke w/ pt.  Advised her that I am leaving samples of Ranexa 1000 mg at the front desk to p/u at her convenience.  She verbalizes understanding and is appreciative.

## 2014-08-19 ENCOUNTER — Encounter: Payer: Self-pay | Admitting: Emergency Medicine

## 2014-08-19 ENCOUNTER — Emergency Department: Payer: Medicare PPO

## 2014-08-19 ENCOUNTER — Emergency Department
Admission: EM | Admit: 2014-08-19 | Discharge: 2014-08-19 | Disposition: A | Discharge status: Home / Self Care | Payer: Medicare PPO | Attending: Emergency Medicine | Admitting: Emergency Medicine

## 2014-08-19 ENCOUNTER — Other Ambulatory Visit: Payer: Self-pay

## 2014-08-19 DIAGNOSIS — Z87891 Personal history of nicotine dependence: Secondary | ICD-10-CM | POA: Insufficient documentation

## 2014-08-19 DIAGNOSIS — Z7902 Long term (current) use of antithrombotics/antiplatelets: Secondary | ICD-10-CM | POA: Diagnosis not present

## 2014-08-19 DIAGNOSIS — E119 Type 2 diabetes mellitus without complications: Secondary | ICD-10-CM | POA: Insufficient documentation

## 2014-08-19 DIAGNOSIS — R42 Dizziness and giddiness: Secondary | ICD-10-CM | POA: Diagnosis not present

## 2014-08-19 DIAGNOSIS — N189 Chronic kidney disease, unspecified: Secondary | ICD-10-CM | POA: Diagnosis not present

## 2014-08-19 DIAGNOSIS — I129 Hypertensive chronic kidney disease with stage 1 through stage 4 chronic kidney disease, or unspecified chronic kidney disease: Secondary | ICD-10-CM | POA: Insufficient documentation

## 2014-08-19 DIAGNOSIS — Z79899 Other long term (current) drug therapy: Secondary | ICD-10-CM | POA: Insufficient documentation

## 2014-08-19 DIAGNOSIS — Z7982 Long term (current) use of aspirin: Secondary | ICD-10-CM | POA: Insufficient documentation

## 2014-08-19 LAB — BASIC METABOLIC PANEL
ANION GAP: 7 (ref 5–15)
BUN: 26 mg/dL — ABNORMAL HIGH (ref 6–20)
CALCIUM: 9.3 mg/dL (ref 8.9–10.3)
CO2: 23 mmol/L (ref 22–32)
Chloride: 104 mmol/L (ref 101–111)
Creatinine, Ser: 1.48 mg/dL — ABNORMAL HIGH (ref 0.44–1.00)
GFR calc Af Amer: 41 mL/min — ABNORMAL LOW (ref >60)
GFR calc non Af Amer: 35 mL/min — ABNORMAL LOW (ref >60)
GLUCOSE: 145 mg/dL — AB (ref 65–99)
Potassium: 4.3 mmol/L (ref 3.5–5.1)
Sodium: 134 mmol/L — ABNORMAL LOW (ref 135–145)

## 2014-08-19 LAB — BRAIN NATRIURETIC PEPTIDE: B Natriuretic Peptide: 60 pg/mL (ref 0.0–100.0)

## 2014-08-19 LAB — CBC
HCT: 39.6 % (ref 35.0–47.0)
Hemoglobin: 12.8 g/dL (ref 12.0–16.0)
MCH: 29 pg (ref 26.0–34.0)
MCHC: 32.3 g/dL (ref 32.0–36.0)
MCV: 89.8 fL (ref 80.0–100.0)
Platelets: 226 10*3/uL (ref 150–440)
RBC: 4.41 MIL/uL (ref 3.80–5.20)
RDW: 14.1 % (ref 11.5–14.5)
WBC: 6.9 10*3/uL (ref 3.6–11.0)

## 2014-08-19 LAB — TROPONIN I

## 2014-08-19 NOTE — ED Provider Notes (Signed)
[facility] Emergency Department Provider Note  ____________________________________________  Time seen: Approximately 0730 AM  I have reviewed the triage vital signs and the nursing notes.   HISTORY  Chief Complaint Dizziness    HPI Toni Marshall is a 69 y.o. female with a history of CHF and CAD who presents today with lightheadedness earlier this morning. She denies any dizziness or the sensation of the room spinning. She said that she woke at 3 AM feeling lightheaded and with mild cramping to the right shoulder. Denies chest pain. She says that her right shoulder pain felt as if she had done exercise. She denied any diaphoresis, nausea, shortness of breath, vomiting. She said that the entire episode lasted about 30 minutes. She took a 325 mg aspirin as well last night. She denies any symptoms at this time. She had been ambulating prior to arrival to the emergency department without any dizziness, shortness of breath or feelings of near syncope. She also notes a 5 pound weight gain over the last 3 days.Says that she felt the same way as she did last night the last time she had "fluid in her lungs."   Past Medical History  Diagnosis Date  . H/O acute respiratory failure   . CHF (congestive heart failure)     chronic systolic dysfunction with EF of 45%  . NSTEMI (non-ST elevated myocardial infarction)   . CAD (coronary artery disease)   . Hypertension   . Cardiogenic shock   . Hyperlipidemia   . Glaucoma   . Rheumatoid arthritis(714.0)   . Diabetes mellitus     Type II  . COPD (chronic obstructive pulmonary disease)   . Chronic kidney disease     Patient Active Problem List   Diagnosis Date Noted  . COPD (chronic obstructive pulmonary disease) 05/03/2014  . Back pain 04/13/2014  . Orthostatic hypotension 01/09/2014  . Morbid obesity 10/06/2013  . CAD (coronary artery disease) of artery bypass graft 12/31/2011  . Systolic dysfunction, left ventricle  12/31/2011  . Essential hypertension, malignant 12/31/2011  . Diabetes 12/31/2011  . Hyperlipidemia 12/31/2011    Past Surgical History  Procedure Laterality Date  . Cardiac catheterization      x2 [facility]  . Cardiac catheterization      [facility]  . Coronary artery bypass graft  2012  . Coronary artery bypass graft  2002  . Eye surgery      left  . Colonoscopy      Current Outpatient Rx  Name  Route  Sig  Dispense  Refill  . albuterol (PROVENTIL HFA;VENTOLIN HFA) 108 (90 BASE) MCG/ACT inhaler   Inhalation   Inhale 2 puffs into the lungs every 6 (six) hours as needed for wheezing or shortness of breath.         Marland Kitchen aspirin 81 MG tablet   Oral   Take 81 mg by mouth daily.         Marland Kitchen atorvastatin (LIPITOR) 80 MG tablet   Oral   Take 1 tablet (80 mg total) by mouth daily.   90 tablet   3   . Brimonidine Tartrate-Timolol (COMBIGAN OP)   Ophthalmic   Apply to eye 2 (two) times daily.          . budesonide-formoterol (SYMBICORT) 160-4.5 MCG/ACT inhaler   Inhalation   Inhale 2 puffs into the lungs 2 (two) times daily.         Marland Kitchen CALCIUM PLUS VITAMIN D3 600-500 MG-UNIT CAPS   Oral  Take 1 tablet by mouth 2 (two) times daily.            Dispense as written.   . carvedilol (COREG) 3.125 MG tablet   Oral   Take 3.125 mg by mouth 2 (two) times daily with a meal.         . clopidogrel (PLAVIX) 75 MG tablet   Oral   Take 75 mg by mouth daily.         . folic acid (FOLVITE) 400 MCG tablet   Oral   Take 400 mcg by mouth daily.         . furosemide (LASIX) 20 MG tablet   Oral   Take 20 mg by mouth every other day.          Marland Kitchen glipiZIDE (GLUCOTROL XL) 2.5 MG 24 hr tablet   Oral   Take 2.5 mg by mouth daily with breakfast.         . isosorbide mononitrate (IMDUR) 30 MG 24 hr tablet      TAKE 1 TABLET BY MOUTH EVERY DAY   90 tablet   3   . lisinopril (PRINIVIL,ZESTRIL) 5 MG tablet   Oral   Take 1 tablet (5 mg total) by mouth daily.   90 tablet   3    . magnesium oxide (MAG-OX) 400 MG tablet   Oral   Take 400 mg by mouth 2 (two) times daily.          . meclizine (ANTIVERT) 25 MG tablet   Oral   Take 25 mg by mouth 3 (three) times daily as needed for dizziness.         . nitroGLYCERIN (NITRODUR - DOSED IN MG/24 HR) 0.4 mg/hr patch   Transdermal   Place 1 patch (0.4 mg total) onto the skin daily.   30 patch   3   . potassium chloride SA (K-DUR,KLOR-CON) 20 MEQ tablet   Oral   Take 20 mEq by mouth every other day.         Marland Kitchen RANEXA 1000 MG SR tablet      TAKE 1 TABLET TWICE A DAY   60 tablet   3   . timolol (TIMOPTIC) 0.5 % ophthalmic solution            1   . traMADol (ULTRAM) 50 MG tablet   Oral   Take 50 mg by mouth every 6 (six) hours as needed for moderate pain.           Allergies Vancomycin  Family History  Problem Relation Age of Onset  . Heart attack Mother   . Stroke Sister     Social History History  Substance Use Topics  . Smoking status: Former Smoker -- 0.50 packs/day for 15 years    Types: Cigarettes  . Smokeless tobacco: Not on file  . Alcohol Use: No    Review of Systems Constitutional: No fever/chills Eyes: No visual changes. ENT: No sore throat. Cardiovascular: Denies chest pain. Respiratory: Denies shortness of breath. Gastrointestinal: No abdominal pain.  No nausea, no vomiting.  No diarrhea.  No constipation. Genitourinary: Negative for dysuria. Musculoskeletal: Negative for back pain. Skin: Negative for rash. Neurological: Negative for headaches, focal weakness or numbness.  10-point ROS otherwise negative.  ____________________________________________   PHYSICAL EXAM:  VITAL SIGNS: ED Triage Vitals  Enc Vitals Group     BP 08/19/14 0735 132/66 mmHg     Pulse Rate 08/19/14 0735 75     Resp  08/19/14 0735 20     Temp 08/19/14 0735 98.1 F (36.7 C)     Temp Source 08/19/14 0735 Oral     SpO2 08/19/14 0735 97 %     Weight 08/19/14 0735 225 lb (102.059 kg)      Height 08/19/14 0735 5\' 1"  (1.549 m)     Head Cir --      Peak Flow --      Pain Score --      Pain Loc --      Pain Edu? --      Excl. in GC? --     Constitutional: Alert and oriented. Well appearing and in no acute distress. Eyes: Conjunctivae are normal. PERRL. EOMI. Head: Atraumatic. Nose: No congestion/rhinnorhea. Mouth/Throat: Mucous membranes are moist.  Oropharynx non-erythematous. Neck: No stridor.   Cardiovascular: Normal rate, regular rhythm. Grossly normal heart sounds.  Good peripheral circulation. Respiratory: Normal respiratory effort.  No retractions. Lungs CTAB. Gastrointestinal: Soft and nontender. No distention. No abdominal bruits. No CVA tenderness. Musculoskeletal: Mild bilateral and equal lower extremity edema to the ankles.  No joint effusions. Neurologic:  Normal speech and language. No gross focal neurologic deficits are appreciated. Speech is normal. No gait instability. Skin:  Skin is warm, dry and intact. No rash noted. Psychiatric: Mood and affect are normal. Speech and behavior are normal.  ____________________________________________   LABS (all labs ordered are listed, but only abnormal results are displayed)  Labs Reviewed  BASIC METABOLIC PANEL - Abnormal; Notable for the following:    Sodium 134 (*)    Glucose, Bld 145 (*)    BUN 26 (*)    Creatinine, Ser 1.48 (*)    GFR calc non Af Amer 35 (*)    GFR calc Af Amer 41 (*)    All other components within normal limits  CBC  BRAIN NATRIURETIC PEPTIDE  TROPONIN I   ____________________________________________  EKG  ED ECG REPORT I, , the attending physician, personally viewed and interpreted this ECG.   Date: 08/19/2014  EKG Time: 747  Rate: 76  Rhythm: normal sinus rhythm  Axis: Normal axis  Intervals:none  ST&T Change: T wave inversions in 1, aVL and V2. Concave ST elevation in lead 3 and aVF of about a half a millimeter. No reciprocal depressions. No  change from EKG from 05/02/2004.  ____________________________________________  RADIOLOGY  No acute disease process on chest x-ray. I personally reviewed the images. ____________________________________________   PROCEDURES    ____________________________________________   INITIAL IMPRESSION / ASSESSMENT AND PLAN / ED COURSE  Pertinent labs & imaging results that were available during my care of the patient were reviewed by me and considered in my medical decision making (see chart for details).  ----------------------------------------- 10:12 AM on 08/19/2014 -----------------------------------------  Patient resting comfortably at this time without any complaints. No dizziness, no shortness of breath no chest pain. Has appointment with Dr. 08/21/2014, her cardiologist tomorrow. We will discharge to home. Patient did not have any feelings of passing out last night or in the emergency department. Less likely to be syncopal episode. Reassuring labs with unchanged EKG. Also has been asymptomatic now for about 7 hours. ____________________________________________   FINAL CLINICAL IMPRESSION(S) / ED DIAGNOSES  Acute lightheadedness with right shoulder pain, resolved. Initial visit.    Mariah Milling, MD 08/19/14 1017

## 2014-08-19 NOTE — ED Notes (Signed)
Awoke this AM feeling dizzy, " I took one of my pills for dizziness and fell back asleep" on O2 @3liters  at night and with daily activity,  In the last 3 days has gained 5 lbs . Pt in no distress

## 2014-08-19 NOTE — ED Notes (Signed)
Patient transported to X-ray 

## 2014-08-19 NOTE — ED Notes (Signed)
D/c instructions reviewed w/ pt - pt denies any further questions or concerns at present.   

## 2014-08-20 ENCOUNTER — Ambulatory Visit (INDEPENDENT_AMBULATORY_CARE_PROVIDER_SITE_OTHER): Payer: Medicare PPO | Admitting: Cardiovascular Disease

## 2014-08-20 ENCOUNTER — Encounter: Payer: Self-pay | Admitting: Cardiovascular Disease

## 2014-08-20 VITALS — BP 90/62 | HR 76 | Ht 61.0 in | Wt 228.0 lb

## 2014-08-20 DIAGNOSIS — I10 Essential (primary) hypertension: Secondary | ICD-10-CM

## 2014-08-20 DIAGNOSIS — E785 Hyperlipidemia, unspecified: Secondary | ICD-10-CM

## 2014-08-20 DIAGNOSIS — I519 Heart disease, unspecified: Secondary | ICD-10-CM | POA: Diagnosis not present

## 2014-08-20 DIAGNOSIS — I25810 Atherosclerosis of coronary artery bypass graft(s) without angina pectoris: Secondary | ICD-10-CM

## 2014-08-20 DIAGNOSIS — J439 Emphysema, unspecified: Secondary | ICD-10-CM

## 2014-08-20 DIAGNOSIS — R0602 Shortness of breath: Secondary | ICD-10-CM | POA: Diagnosis not present

## 2014-08-20 DIAGNOSIS — I5032 Chronic diastolic (congestive) heart failure: Secondary | ICD-10-CM

## 2014-08-20 NOTE — Progress Notes (Signed)
Patient ID: Toni Marshall, female    DOB: 06/16/1945, 69 y.o.   MRN: [id number]  HPI Comments: Toni Marshall is a very pleasant 69 year old woman with history of coronary artery disease, bypass surgery in 2002, repeat bypass November 2012 at [facility], diabetes, hypertension, hyperlipidemia, obesity with non-STEMI 10/29/2011 with presentation to [facility].   She is seen at the [facility] clinic. Her previous anginal symptoms included shortness of breath, possibly indigestion Long history of smoking, 16 years of working in a [employer] needs 3 L oxygen,  She presents today for follow-up of her chronic diastolic CHF  She was seen in the hospital yesterday for lightheadedness in the morning, cramping in her shoulder, 5 pound weight gain BMP showed mild dehydration, low BNP, all other labs normal, she was discharged home with follow-up today She's been taking her diarrhetic every other day No significant lower extremity edema. Weight at home 223 pounds Weight in the office today is stable. She has minimal ankle edema, likely venous insufficiency In follow-up today she is using 3 L oxygen  EKG on today's visit shows normal sinus rhythm with rate 76 bpm, no significant ST or T-wave changes other past medical history  Other past medical history admitted February 25 with discharge 04/23/2014. Admitted with shortness of breath. No significant improvement in symptoms with diuresis, climb in her creatinine and BUN. 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  She presents today for follow-up with 2-3 L oxygen. She has a generator at home, 2 tanks with 3 hours of oxygen each In general she feels better, no complaints, less shortness of breath. It was mentioned in the hospital at she consider a outpatient sleep study. She will talk with primary care. She denies any lightheadedness or dizziness. Blood pressure  low this a.m. which she attributes to not eating breakfast, taking all her morning medications  Echocardiogram dated 04/05/2014 shows ejection fraction of 60-65%, diastolic relaxation abnormality, otherwise a normal study Cardiac stress test 04/06/2014 for atypical chest pain showed severe scar in the anterior and apical territory, ejection fraction 50%   hospitalization 04/05/2014 for chest pain. She ruled out for MI, had stress test showing no significant ischemia. Significant artifact noted. Symptoms were felt to be somewhat atypical in nature  Typically total cholesterol 130 Previous long complicated hospital course with acute respiratory failure, bibasilar pneumonia. She is on a ventilator and required prolonged resuscitation.  Catheterization in 10/30/2011 showed severe three-vessel coronary artery disease, patent vein graft to the RCA, occluded vein graft to the OM and LIMA to the LAD. Attempted LAD PCI which was unsuccessful.  Left main is moderately calcified, 99% mid LAD followed by a 90% lesion, 50% distal LAD disease, diagonal #2 had 90% ostial disease, 90% mid circumflex, proximal 60% RCA disease, LIMA graft with 100% stenosis at the graft ostium, saphenous vein graft to the OM with 100% ostial graft disease, vein graft to the distal RCA showing 30% proximal graft disease, 40% mid graft disease, ejection fraction 40%  Previous Echocardiogram showing ejection fraction 40-45%, apical akinesis with severe distal anterior and distal inferior wall hypokinesis, mild MR echocardiogram in the hospital 09/16/2012 showed ejection fraction 50-55%, otherwise normal study   Allergies  Allergen Reactions  . Vancomycin     Hypotension & hypoxia     Outpatient Encounter Prescriptions as of 68/27/2016  Medication Sig  . albuterol (PROVENTIL HFA;VENTOLIN HFA) 108 (90 BASE)  MCG/ACT inhaler Inhale 2 puffs into the lungs every 6 (six) hours as needed for wheezing or shortness of breath.  Marland Kitchen aspirin  81 MG tablet Take 81 mg by mouth daily.  Marland Kitchen atorvastatin (LIPITOR) 80 MG tablet Take 1 tablet (80 mg total) by mouth daily.  . Brimonidine Tartrate-Timolol (COMBIGAN OP) Apply to eye 2 (two) times daily.   . budesonide-formoterol (SYMBICORT) 160-4.5 MCG/ACT inhaler Inhale 2 puffs into the lungs 2 (two) times daily.  Marland Kitchen CALCIUM PLUS VITAMIN D3 600-500 MG-UNIT CAPS Take 1 tablet by mouth 2 (two) times daily.   . carvedilol (COREG) 3.125 MG tablet Take 3.125 mg by mouth 2 (two) times daily with a meal.  . clopidogrel (PLAVIX) 75 MG tablet Take 75 mg by mouth daily.  . folic acid (FOLVITE) 400 MCG tablet Take 400 mcg by mouth daily.  . furosemide (LASIX) 20 MG tablet Take 20 mg by mouth every other day.   Marland Kitchen glipiZIDE (GLUCOTROL XL) 2.5 MG 24 hr tablet Take 2.5 mg by mouth daily with breakfast.  . isosorbide mononitrate (IMDUR) 30 MG 24 hr tablet TAKE 1 TABLET BY MOUTH EVERY DAY  . lisinopril (PRINIVIL,ZESTRIL) 5 MG tablet Take 1 tablet (5 mg total) by mouth daily.  . magnesium oxide (MAG-OX) 400 MG tablet Take 400 mg by mouth 2 (two) times daily.   . meclizine (ANTIVERT) 25 MG tablet Take 25 mg by mouth 3 (three) times daily as needed for dizziness.  . nitroGLYCERIN (NITRODUR - DOSED IN MG/24 HR) 0.4 mg/hr patch Place 1 patch (0.4 mg total) onto the skin daily.  . potassium chloride SA (K-DUR,KLOR-CON) 20 MEQ tablet Take 20 mEq by mouth every other day.  Marland Kitchen RANEXA 1000 MG SR tablet TAKE 1 TABLET TWICE A DAY  . timolol (TIMOPTIC) 0.5 % ophthalmic solution Place 1 drop into the left eye 2 (two) times daily.   . traMADol (ULTRAM) 50 MG tablet Take 50 mg by mouth every 6 (six) hours as needed for moderate pain.   No facility-administered encounter medications on file as of 68/27/2016.    Past Medical History  Diagnosis Date  . H/O acute respiratory failure   . CHF (congestive heart failure)     chronic systolic dysfunction with EF of 45%  . NSTEMI (non-ST elevated myocardial infarction)   . CAD  (coronary artery disease)   . Hypertension   . Cardiogenic shock   . Hyperlipidemia   . Glaucoma   . Rheumatoid arthritis(714.0)   . Diabetes mellitus     Type II  . COPD (chronic obstructive pulmonary disease)   . Chronic kidney disease     Past Surgical History  Procedure Laterality Date  . Cardiac catheterization      x2 [facility]  . Cardiac catheterization      [facility]  . Coronary artery bypass graft  2012  . Coronary artery bypass graft  2002  . Eye surgery      left  . Colonoscopy      Social History  reports that she has quit smoking. Her smoking use included Cigarettes. She has a 7.5 pack-year smoking history. She does not have any smokeless tobacco history on file. She reports that she does not drink alcohol or use illicit drugs.  Family History family history includes Heart attack in her mother; Stroke in her sister.  Review of Systems  Constitutional: Negative.   Respiratory: Negative.   Cardiovascular: Negative.   Gastrointestinal: Negative.   Musculoskeletal: Negative.   Skin: Negative.  Neurological: Negative.   Hematological: Negative.   Psychiatric/Behavioral: Negative.   All other systems reviewed and are negative.  BP 90/62 mmHg  Pulse 76  Ht 5\' 1"  (1.549 m)  Wt 228 lb (103.42 kg)  BMI 43.10 kg/m2  Physical Exam  Constitutional: She is oriented to person, place, and time. She appears well-developed and well-nourished.  obese  HENT:  Head: Normocephalic.  Nose: Nose normal.  Mouth/Throat: Oropharynx is clear and moist.  Eyes: Conjunctivae are normal. Pupils are equal, round, and reactive to light.  Neck: Normal range of motion. Neck supple. No JVD present.  Cardiovascular: Normal rate, regular rhythm, S1 normal, S2 normal, normal heart sounds and intact distal pulses.  Exam reveals no gallop and no friction rub.   No murmur heard. Pulmonary/Chest: Effort normal and breath sounds normal. No respiratory distress. She has no wheezes. She has no  rales. She exhibits no tenderness.  Abdominal: Soft. Bowel sounds are normal. She exhibits no distension. There is no tenderness.  Musculoskeletal: Normal range of motion. She exhibits no edema or tenderness.  Lymphadenopathy:    She has no cervical adenopathy.  Neurological: She is alert and oriented to person, place, and time. Coordination normal.  Skin: Skin is warm and dry. No rash noted. No erythema.  Psychiatric: She has a normal mood and affect. Her behavior is normal. Judgment and thought content normal.    Assessment and Plan  Nursing note and vitals reviewed.

## 2014-08-20 NOTE — Assessment & Plan Note (Signed)
Stable on 3L oxygen  

## 2014-08-20 NOTE — Assessment & Plan Note (Signed)
Cholesterol is at goal on the current lipid regimen. No changes to the medications were made.  

## 2014-08-20 NOTE — Assessment & Plan Note (Signed)
Currently with no symptoms of angina. No further workup at this time. Continue current medication regimen. 

## 2014-08-20 NOTE — Assessment & Plan Note (Signed)
We have encouraged continued exercise, careful diet management in an effort to lose weight. 

## 2014-08-20 NOTE — Assessment & Plan Note (Signed)
Blood pressure is well controlled on today's visit. No changes made to the medications. 

## 2014-08-20 NOTE — Assessment & Plan Note (Signed)
Recommended she continue Lasix every other day. Recent lab work showing prerenal state. Increase her fluid intake, or skip a dose of Lasix here and there. Extra dose of Lasix for weight greater than 226 pounds or more.  current weight 223 pounds at home.

## 2014-08-20 NOTE — Patient Instructions (Signed)
You are doing well. No medication changes were made.  Please call us if you have new issues that need to be addressed before your next appt.  Your physician wants you to follow-up in: 6 months.  You will receive a reminder letter in the mail two months in advance. If you don't receive a letter, please call our office to schedule the follow-up appointment.   

## 2014-08-29 ENCOUNTER — Other Ambulatory Visit: Payer: Self-pay | Admitting: Family Medicine

## 2014-08-29 DIAGNOSIS — N63 Unspecified lump in unspecified breast: Secondary | ICD-10-CM

## 2014-08-31 ENCOUNTER — Ambulatory Visit
Admission: RE | Admit: 2014-08-31 | Discharge: 2014-08-31 | Disposition: A | Discharge status: Home / Self Care | Payer: Medicare PPO | Source: Ambulatory Visit | Attending: Family Medicine | Admitting: Family Medicine

## 2014-08-31 ENCOUNTER — Ambulatory Visit: Payer: Medicare PPO

## 2014-08-31 DIAGNOSIS — N63 Unspecified lump in unspecified breast: Secondary | ICD-10-CM

## 2014-09-11 ENCOUNTER — Telehealth: Payer: Self-pay | Admitting: *Deleted

## 2014-09-11 NOTE — Telephone Encounter (Signed)
Samples of this drug were given to the patient, quantity 2 weeks, Lot Number [id number]  Notified pt that two week supply of Ranexa samples left at front desk. Will forward note to [facility] for possible alternative to Ranexa as pt states it is too expensive.

## 2014-09-11 NOTE — Telephone Encounter (Signed)
Please advise pt requesting alternate due to cost.

## 2014-09-11 NOTE — Telephone Encounter (Signed)
Patient calling the office for samples of medication:   1.  What medication and dosage are you requesting samples for? RANEXA  2.  Are you currently out of this medication? Not yes, can last about 2 more days  3. Are you requesting samples to get you through until a mail order prescription arrives? No, it is expensive She is asking if she can take another kind of pill for this is getting expensive.

## 2014-09-12 NOTE — Telephone Encounter (Signed)
There is no generic option, no similar alternatives Perhaps we can look into company assistance

## 2014-09-17 ENCOUNTER — Encounter: Payer: Self-pay | Admitting: Family

## 2014-09-17 ENCOUNTER — Ambulatory Visit: Payer: Medicare PPO | Attending: Family | Admitting: Family

## 2014-09-17 VITALS — BP 98/56 | HR 75 | Resp 20 | Ht 61.0 in | Wt 227.0 lb

## 2014-09-17 DIAGNOSIS — I2510 Atherosclerotic heart disease of native coronary artery without angina pectoris: Secondary | ICD-10-CM | POA: Diagnosis not present

## 2014-09-17 DIAGNOSIS — I252 Old myocardial infarction: Secondary | ICD-10-CM | POA: Insufficient documentation

## 2014-09-17 DIAGNOSIS — Z7982 Long term (current) use of aspirin: Secondary | ICD-10-CM | POA: Insufficient documentation

## 2014-09-17 DIAGNOSIS — Z79899 Other long term (current) drug therapy: Secondary | ICD-10-CM | POA: Diagnosis not present

## 2014-09-17 DIAGNOSIS — I129 Hypertensive chronic kidney disease with stage 1 through stage 4 chronic kidney disease, or unspecified chronic kidney disease: Secondary | ICD-10-CM | POA: Insufficient documentation

## 2014-09-17 DIAGNOSIS — M069 Rheumatoid arthritis, unspecified: Secondary | ICD-10-CM | POA: Diagnosis not present

## 2014-09-17 DIAGNOSIS — E119 Type 2 diabetes mellitus without complications: Secondary | ICD-10-CM | POA: Insufficient documentation

## 2014-09-17 DIAGNOSIS — E1159 Type 2 diabetes mellitus with other circulatory complications: Secondary | ICD-10-CM

## 2014-09-17 DIAGNOSIS — I5032 Chronic diastolic (congestive) heart failure: Secondary | ICD-10-CM

## 2014-09-17 DIAGNOSIS — J449 Chronic obstructive pulmonary disease, unspecified: Secondary | ICD-10-CM | POA: Insufficient documentation

## 2014-09-17 DIAGNOSIS — E785 Hyperlipidemia, unspecified: Secondary | ICD-10-CM | POA: Diagnosis not present

## 2014-09-17 DIAGNOSIS — R200 Anesthesia of skin: Secondary | ICD-10-CM | POA: Insufficient documentation

## 2014-09-17 DIAGNOSIS — I509 Heart failure, unspecified: Secondary | ICD-10-CM | POA: Diagnosis not present

## 2014-09-17 DIAGNOSIS — Z87891 Personal history of nicotine dependence: Secondary | ICD-10-CM | POA: Insufficient documentation

## 2014-09-17 DIAGNOSIS — J439 Emphysema, unspecified: Secondary | ICD-10-CM

## 2014-09-17 NOTE — Progress Notes (Signed)
Subjective:    Patient ID: Toni Marshall, female    DOB: [birth year], 69 y.o.   MRN: [id number]  Shortness of Breath This is a chronic problem. The current episode started more than 1 year ago. The problem occurs daily. The problem has been unchanged. Associated symptoms include leg swelling. Pertinent negatives include no abdominal pain, chest pain, headaches, neck pain, sore throat or wheezing. The symptoms are aggravated by any activity. The patient has no known risk factors for DVT/PE. She has tried beta agonist inhalers, steroid inhalers and body position changes for the symptoms. The treatment provided moderate relief. Her past medical history is significant for chronic lung disease, COPD and a heart failure.  Other This is a chronic (edema) problem. The current episode started more than 1 year ago. The problem occurs daily. The problem has been unchanged. Associated symptoms include arthralgias (joints in hands), fatigue and numbness (intermittent in right hand). Pertinent negatives include no abdominal pain, chest pain, congestion, coughing, headaches, neck pain or sore throat. The symptoms are aggravated by walking and standing. She has tried position changes for the symptoms. The treatment provided moderate relief.  Congestive Heart Failure Presents for follow-up visit. The disease course has been stable. Associated symptoms include edema, fatigue, palpitations (when walking long distances or up hill) and shortness of breath. Pertinent negatives include no abdominal pain, chest pain or orthopnea. The symptoms have been stable. Past treatments include beta blockers, salt and fluid restriction, oxygen and ACE inhibitors. The treatment provided moderate relief. Compliance with prior treatments has been good. Her past medical history is significant for chronic lung disease and DM.   Past Medical History  Diagnosis Date  . H/O acute respiratory failure   . CHF (congestive heart failure)    chronic systolic dysfunction with EF of 45%  . NSTEMI (non-ST elevated myocardial infarction)   . CAD (coronary artery disease)   . Hypertension   . Cardiogenic shock   . Hyperlipidemia   . Glaucoma   . Rheumatoid arthritis(714.0)   . Diabetes mellitus     Type II  . COPD (chronic obstructive pulmonary disease)   . Chronic kidney disease     Past Surgical History  Procedure Laterality Date  . Cardiac catheterization      x2 [facility]  . Cardiac catheterization      [facility]  . Coronary artery bypass graft  2012  . Coronary artery bypass graft  2002  . Eye surgery      left  . Colonoscopy      Family History  Problem Relation Age of Onset  . Heart attack Mother   . Stroke Sister   . Hypertension Father   . Diabetes Father     History  Substance Use Topics  . Smoking status: Former Smoker -- 0.50 packs/day for 15 years    Types: Cigarettes  . Smokeless tobacco: Not on file  . Alcohol Use: No    Allergies  Allergen Reactions  . Vancomycin     Hypotension & hypoxia     Prior to Admission medications   Medication Sig Start Date End Date Taking? Authorizing Provider  albuterol (PROVENTIL HFA;VENTOLIN HFA) 108 (90 BASE) MCG/ACT inhaler Inhale 2 puffs into the lungs every 6 (six) hours as needed for wheezing or shortness of breath.   Yes Historical Provider, MD  aspirin 81 MG tablet Take 81 mg by mouth daily.   Yes Historical Provider, MD  atorvastatin (LIPITOR) 80 MG tablet Take 1 tablet (80  mg total) by mouth daily. 11/27/13  Yes Antonieta Iba, MD  Brimonidine Tartrate-Timolol (COMBIGAN OP) Apply to eye 2 (two) times daily.    Yes Historical Provider, MD  CALCIUM PLUS VITAMIN D3 600-500 MG-UNIT CAPS Take 1 tablet by mouth 2 (two) times daily.  04/10/14  Yes Historical Provider, MD  carvedilol (COREG) 3.125 MG tablet Take 3.125 mg by mouth 2 (two) times daily with a meal.   Yes Historical Provider, MD  clopidogrel (PLAVIX) 75 MG tablet Take 75 mg by mouth daily.   Yes  Historical Provider, MD  dorzolamide (TRUSOPT) 2 % ophthalmic solution Place 1 drop into the left eye 2 (two) times daily.   Yes Historical Provider, MD  folic acid (FOLVITE) 400 MCG tablet Take 400 mcg by mouth daily.   Yes Historical Provider, MD  furosemide (LASIX) 20 MG tablet Take 20 mg by mouth every other day.    Yes Historical Provider, MD  glipiZIDE (GLUCOTROL XL) 2.5 MG 24 hr tablet Take 2.5 mg by mouth daily with breakfast.   Yes Historical Provider, MD  isosorbide mononitrate (IMDUR) 30 MG 24 hr tablet TAKE 1 TABLET BY MOUTH EVERY DAY 10/20/13  Yes Antonieta Iba, MD  lisinopril (PRINIVIL,ZESTRIL) 5 MG tablet Take 1 tablet (5 mg total) by mouth daily. 07/26/14  Yes Delma Freeze, FNP  magnesium oxide (MAG-OX) 400 MG tablet Take 400 mg by mouth 2 (two) times daily.    Yes Historical Provider, MD  meclizine (ANTIVERT) 25 MG tablet Take 25 mg by mouth 3 (three) times daily as needed for dizziness.   Yes Historical Provider, MD  nitroGLYCERIN (NITRODUR - DOSED IN MG/24 HR) 0.4 mg/hr patch Place 1 patch (0.4 mg total) onto the skin daily. 10/06/13  Yes Antonieta Iba, MD  potassium chloride SA (K-DUR,KLOR-CON) 20 MEQ tablet Take 10 mEq by mouth 2 (two) times daily.    Yes Historical Provider, MD  RANEXA 1000 MG SR tablet TAKE 1 TABLET TWICE A DAY 09/27/13  Yes Antonieta Iba, MD  timolol (TIMOPTIC) 0.5 % ophthalmic solution Place 1 drop into the left eye 2 (two) times daily.  03/12/14  Yes Historical Provider, MD  traMADol (ULTRAM) 50 MG tablet Take 50 mg by mouth every 6 (six) hours as needed for moderate pain.   Yes Historical Provider, MD     Review of Systems  Constitutional: Positive for fatigue. Negative for appetite change.  HENT: Positive for postnasal drip. Negative for congestion and sore throat.   Eyes: Negative.   Respiratory: Positive for shortness of breath. Negative for cough, chest tightness and wheezing.   Cardiovascular: Positive for palpitations (when walking long  distances or up hill) and leg swelling. Negative for chest pain.  Gastrointestinal: Negative for abdominal pain and abdominal distention.  Endocrine: Negative.   Genitourinary: Negative.   Musculoskeletal: Positive for arthralgias (joints in hands). Negative for back pain and neck pain.  Skin: Negative.   Allergic/Immunologic: Negative.   Neurological: Positive for dizziness and numbness (intermittent in right hand). Negative for headaches.  Hematological: Negative for adenopathy. Bruises/bleeds easily.  Psychiatric/Behavioral: Positive for sleep disturbance (sleeping on 1 1/2 pillows). Negative for dysphoric mood. The patient is not nervous/anxious.        Objective:   Physical Exam  Constitutional: She is oriented to person, place, and time. She appears well-developed and well-nourished.  HENT:  Head: Normocephalic and atraumatic.  Eyes: Conjunctivae are normal. Pupils are equal, round, and reactive to light.  Neck: Normal range  of motion. Neck supple.  Cardiovascular: Normal rate and regular rhythm.   Pulmonary/Chest: Effort normal. She has no wheezes. She has no rales.  Abdominal: Soft. She exhibits no distension. There is no tenderness.  Musculoskeletal: She exhibits edema (1+ bilateral lower legs/ankles). She exhibits no tenderness.  Neurological: She is alert and oriented to person, place, and time.  Skin: Skin is warm and dry.  Psychiatric: She has a normal mood and affect. Her behavior is normal. Thought content normal.  Nursing note and vitals reviewed.   BP 98/56 mmHg  Pulse 75  Resp 20  Ht 5\' 1"  (1.549 m)  Wt 227 lb (102.967 kg)  BMI 42.91 kg/m2  SpO2 100%  LMP  (LMP Unknown)       Assessment & Plan:  1: Chronic heart failure with preserved ejection fraction- Patient presents with shortness of breath and fatigue upon exertion. She also says that she develops edema in her lower legs as the day progresses but when she wakes up in the morning, the swelling is  completely gone. She does try to elevate her legs when she's home. She is currently taking her furosemide every other day which seems to be working. She continues to weigh herself daily and says that her weight has been stable. Weight is down 1 pound from when she was here last. She admits that she doesn't do much activity except for around the house and she was encouraged to be as active as possible with allowing for frequent rest periods. She is currently taking ranexa but voices concern about the cost of it. She says that she is going to speak with her cardiologist about the medication and whether she will be able to continue it. She says that she's not adding any salt to her food and does try to follow a low sodium diet. When discussing fluid intake, she says that she really doesn't drink much fluids at all. Encouraged her to make sure that she's drinking around 1.5 liters of fluid/daily (preferably water).  2: COPD- Continues to wear her oxygen at 3L around the clock including at bedtime. Also continues to use her inhalers.  3: Diabetes- She says that she hasn't checked her glucose in the last couple of days. She says that the last time she checked it, it was 135-145. Encouraged her to check it on a daily basis. 4: Right hand numbness- She says that this is intermittent in nature and tends to occur if she holds her phone in that hand for long periods of time. When she repositions her hand or removes the phone, the numbness resolves. Encouraged her to speak with her PCP regarding this.   Return in 3 months or sooner for any questions/problems before then.

## 2014-09-17 NOTE — Telephone Encounter (Signed)
Pt assistance form completed and mailed to pt for her to complete her part and mail in.

## 2014-09-17 NOTE — Patient Instructions (Addendum)
Continue weighing daily and call for an overnight weight gain of > 2 pounds or a weekly weight gain of >5 pounds.  Work on drinking 1.5-2L of fluids a day.

## 2014-09-26 ENCOUNTER — Telehealth: Payer: Self-pay | Admitting: Cardiovascular Disease

## 2014-09-26 NOTE — Telephone Encounter (Signed)
[facility] samples available of Ranexa 1000 mg are ready to pick up.

## 2014-09-26 NOTE — Telephone Encounter (Signed)
Patient calling the office for samples of medication:   1.  What medication and dosage are you requesting samples for? Ranexa 1000 mg 1 tab PO Twice Daily   2.  Are you currently out of this medication?  No but only has 4 pills left   3. Are you requesting samples to get you through until a mail order prescription arrives?  Patient just found envelope with Assistance application and was instructed per previous phone note to complete and mail to drug company but would like samples while waiting to hear about approval.  Please call when ready

## 2014-10-09 ENCOUNTER — Other Ambulatory Visit: Payer: Self-pay | Admitting: Family Medicine

## 2014-10-09 DIAGNOSIS — N63 Unspecified lump in unspecified breast: Secondary | ICD-10-CM

## 2014-10-19 ENCOUNTER — Telehealth: Payer: Self-pay | Admitting: *Deleted

## 2014-10-19 NOTE — Telephone Encounter (Signed)
Pt calling stating that renexa, since it is expensive. She has not taken it She was told about a generic brand that might a solution She may need samples if we have any.  Please call patient.

## 2014-10-19 NOTE — Telephone Encounter (Signed)
Patient can't afford Ranexa and would like to know what else she can take.  Told the patient we could provide samples until we get a message back from Dr. Mariah Milling.   Samples provided of Ranexa 1000 mg LOT # [id number], Expiration date 05/2017.

## 2014-10-20 NOTE — Telephone Encounter (Signed)
There is no generic She could see how she feels without the ranexa. If she feels ok without, could stop If she needs ranexa for anginal sx, we could ask for patient assistance from company

## 2014-10-22 NOTE — Telephone Encounter (Signed)
Spoke w/ pt.  Advised her of Dr. Windell Hummingbird recommendation.  She is agreeable to stopping Ranexa for a while to see how she feels.  She states that she has an ample supply of samples at the moment and would like to try to taper down.  Asked her to call back if she is symptomatic after trying this.

## 2014-10-26 ENCOUNTER — Other Ambulatory Visit: Payer: Self-pay | Admitting: Cardiovascular Disease

## 2014-11-12 ENCOUNTER — Ambulatory Visit
Admission: RE | Admit: 2014-11-12 | Discharge: 2014-11-12 | Disposition: A | Discharge status: Home / Self Care | Payer: Medicare PPO | Source: Ambulatory Visit | Attending: Family Medicine | Admitting: Family Medicine

## 2014-11-12 DIAGNOSIS — N63 Unspecified lump in unspecified breast: Secondary | ICD-10-CM

## 2014-12-03 ENCOUNTER — Other Ambulatory Visit: Payer: Self-pay | Admitting: Cardiovascular Disease

## 2014-12-18 ENCOUNTER — Ambulatory Visit: Payer: Medicare PPO | Attending: Family | Admitting: Family

## 2014-12-18 ENCOUNTER — Encounter: Payer: Self-pay | Admitting: Family

## 2014-12-18 VITALS — BP 98/44 | HR 73 | Resp 20 | Ht 61.0 in | Wt 229.0 lb

## 2014-12-18 DIAGNOSIS — I252 Old myocardial infarction: Secondary | ICD-10-CM | POA: Diagnosis not present

## 2014-12-18 DIAGNOSIS — Z87891 Personal history of nicotine dependence: Secondary | ICD-10-CM | POA: Diagnosis not present

## 2014-12-18 DIAGNOSIS — E1159 Type 2 diabetes mellitus with other circulatory complications: Secondary | ICD-10-CM

## 2014-12-18 DIAGNOSIS — N189 Chronic kidney disease, unspecified: Secondary | ICD-10-CM | POA: Insufficient documentation

## 2014-12-18 DIAGNOSIS — E785 Hyperlipidemia, unspecified: Secondary | ICD-10-CM | POA: Insufficient documentation

## 2014-12-18 DIAGNOSIS — Z79899 Other long term (current) drug therapy: Secondary | ICD-10-CM | POA: Diagnosis not present

## 2014-12-18 DIAGNOSIS — M069 Rheumatoid arthritis, unspecified: Secondary | ICD-10-CM | POA: Insufficient documentation

## 2014-12-18 DIAGNOSIS — I5032 Chronic diastolic (congestive) heart failure: Secondary | ICD-10-CM | POA: Insufficient documentation

## 2014-12-18 DIAGNOSIS — J439 Emphysema, unspecified: Secondary | ICD-10-CM

## 2014-12-18 DIAGNOSIS — E119 Type 2 diabetes mellitus without complications: Secondary | ICD-10-CM | POA: Diagnosis not present

## 2014-12-18 DIAGNOSIS — Z7982 Long term (current) use of aspirin: Secondary | ICD-10-CM | POA: Insufficient documentation

## 2014-12-18 DIAGNOSIS — I129 Hypertensive chronic kidney disease with stage 1 through stage 4 chronic kidney disease, or unspecified chronic kidney disease: Secondary | ICD-10-CM | POA: Insufficient documentation

## 2014-12-18 DIAGNOSIS — I2510 Atherosclerotic heart disease of native coronary artery without angina pectoris: Secondary | ICD-10-CM | POA: Insufficient documentation

## 2014-12-18 DIAGNOSIS — J449 Chronic obstructive pulmonary disease, unspecified: Secondary | ICD-10-CM | POA: Diagnosis not present

## 2014-12-18 NOTE — Progress Notes (Signed)
Subjective:    Patient ID: Toni Marshall, female    DOB: [birth year], 69 y.o.   MRN: [id number]  Congestive Heart Failure Presents for follow-up visit. The disease course has been stable. Associated symptoms include edema, fatigue, orthopnea and shortness of breath. Pertinent negatives include no abdominal pain, chest pain, chest pressure, palpitations or paroxysmal nocturnal dyspnea. The symptoms have been stable. Past treatments include beta blockers, ACE inhibitors, oxygen and salt and fluid restriction. The treatment provided moderate relief. Compliance with prior treatments has been good. Her past medical history is significant for CAD, chronic lung disease, DM and HTN. She has one 1st degree relative with heart disease. Compliance with total regimen is 76-100%.  Other This is a chronic (edema) problem. The current episode started more than 1 year ago. The problem occurs daily. The problem has been unchanged. Associated symptoms include coughing (clear phlegm) and fatigue. Pertinent negatives include no abdominal pain, chest pain, congestion, headaches, neck pain, numbness or sore throat. The symptoms are aggravated by walking and standing. She has tried position changes for the symptoms. The treatment provided moderate relief.   Past Medical History  Diagnosis Date  . H/O acute respiratory failure   . CHF (congestive heart failure) (HCC)     chronic systolic dysfunction with EF of 45%  . NSTEMI (non-ST elevated myocardial infarction) (HCC)   . CAD (coronary artery disease)   . Hypertension   . Cardiogenic shock (HCC)   . Hyperlipidemia   . Glaucoma   . Rheumatoid arthritis(714.0)   . Diabetes mellitus     Type II  . COPD (chronic obstructive pulmonary disease) (HCC)   . Chronic kidney disease     Past Surgical History  Procedure Laterality Date  . Cardiac catheterization      x2 [facility]  . Cardiac catheterization      [facility]  . Coronary artery bypass graft  2012  . Coronary artery  bypass graft  2002  . Eye surgery      left  . Colonoscopy      Family History  Problem Relation Age of Onset  . Heart attack Mother   . Stroke Sister   . Hypertension Father   . Diabetes Father     Social History  Substance Use Topics  . Smoking status: Former Smoker -- 0.50 packs/day for 15 years    Types: Cigarettes  . Smokeless tobacco: Never Used  . Alcohol Use: No    Allergies  Allergen Reactions  . Vancomycin     Hypotension & hypoxia     Prior to Admission medications   Medication Sig Start Date End Date Taking? Authorizing Provider  albuterol (PROVENTIL HFA;VENTOLIN HFA) 108 (90 BASE) MCG/ACT inhaler Inhale 2 puffs into the lungs every 6 (six) hours as needed for wheezing or shortness of breath.   Yes Historical Provider, MD  aspirin 81 MG tablet Take 81 mg by mouth daily.   Yes Historical Provider, MD  atorvastatin (LIPITOR) 80 MG tablet TAKE 1 TABLET (80 MG TOTAL) BY MOUTH DAILY. 12/03/14  Yes Antonieta Iba, MD  Brimonidine Tartrate-Timolol (COMBIGAN OP) Apply to eye 2 (two) times daily.    Yes Historical Provider, MD  CALCIUM PLUS VITAMIN D3 600-500 MG-UNIT CAPS Take 1 tablet by mouth 2 (two) times daily.  04/10/14  Yes Historical Provider, MD  carvedilol (COREG) 3.125 MG tablet Take 3.125 mg by mouth 2 (two) times daily with a meal.   Yes Historical Provider, MD  clopidogrel (PLAVIX) 75  MG tablet Take 75 mg by mouth daily.   Yes Historical Provider, MD  dorzolamide (TRUSOPT) 2 % ophthalmic solution Place 1 drop into the left eye 2 (two) times daily.   Yes Historical Provider, MD  folic acid (FOLVITE) 400 MCG tablet Take 400 mcg by mouth daily.   Yes Historical Provider, MD  furosemide (LASIX) 20 MG tablet Take 20 mg by mouth every other day.    Yes Historical Provider, MD  glipiZIDE (GLUCOTROL XL) 2.5 MG 24 hr tablet Take 2.5 mg by mouth daily with breakfast.   Yes Historical Provider, MD  isosorbide mononitrate (IMDUR) 30 MG 24 hr tablet TAKE 1 TABLET BY  MOUTH EVERY DAY 10/26/14  Yes Antonieta Iba, MD  lisinopril (PRINIVIL,ZESTRIL) 5 MG tablet Take 1 tablet (5 mg total) by mouth daily. 07/26/14  Yes Delma Freeze, FNP  magnesium oxide (MAG-OX) 400 MG tablet Take 400 mg by mouth 2 (two) times daily.    Yes Historical Provider, MD  meclizine (ANTIVERT) 25 MG tablet Take 25 mg by mouth 3 (three) times daily as needed for dizziness.   Yes Historical Provider, MD  naproxen (NAPROSYN) 500 MG tablet Take 500 mg by mouth 2 (two) times daily with a meal.   Yes Historical Provider, MD  nitroGLYCERIN (NITRODUR - DOSED IN MG/24 HR) 0.4 mg/hr patch Place 1 patch (0.4 mg total) onto the skin daily. 10/06/13  Yes Antonieta Iba, MD  potassium chloride SA (K-DUR,KLOR-CON) 20 MEQ tablet Take 10 mEq by mouth every other day.    Yes Historical Provider, MD  traMADol (ULTRAM) 50 MG tablet Take 50 mg by mouth every 6 (six) hours as needed for moderate pain.   Yes Historical Provider, MD  RANEXA 1000 MG SR tablet TAKE 1 TABLET TWICE A DAY Patient not taking: Reported on 12/18/2014 09/27/13   Antonieta Iba, MD      Review of Systems  Constitutional: Positive for fatigue. Negative for appetite change.  HENT: Positive for rhinorrhea (with oxygen). Negative for congestion and sore throat.   Eyes: Negative.   Respiratory: Positive for cough (clear phlegm), shortness of breath and wheezing (at times). Negative for chest tightness.   Cardiovascular: Positive for leg swelling (chronic). Negative for chest pain and palpitations.  Gastrointestinal: Negative for abdominal pain and abdominal distention.  Endocrine: Negative.   Genitourinary: Negative.   Musculoskeletal: Negative for back pain and neck pain.  Skin: Negative.   Allergic/Immunologic: Negative.   Neurological: Negative for dizziness, light-headedness, numbness and headaches.  Hematological: Negative for adenopathy. Does not bruise/bleed easily.  Psychiatric/Behavioral: Positive for sleep disturbance  (chronically doesn't sleep well; sleeping on 3 pillows). Negative for dysphoric mood. The patient is not nervous/anxious.        Objective:   Physical Exam  Constitutional: She is oriented to person, place, and time. She appears well-developed and well-nourished.  HENT:  Head: Normocephalic and atraumatic.  Eyes: Conjunctivae are normal. Pupils are equal, round, and reactive to light.  Neck: Normal range of motion. Neck supple.  Cardiovascular: Normal rate and regular rhythm.   Pulmonary/Chest: Effort normal. She has no wheezes. She has no rales.  Abdominal: Soft. She exhibits no distension. There is no tenderness.  Musculoskeletal: She exhibits edema (trace amount bilateral lower legs). She exhibits no tenderness.  Neurological: She is alert and oriented to person, place, and time.  Skin: Skin is warm and dry.  Psychiatric: She has a normal mood and affect. Her behavior is normal. Thought content normal.  Nursing note and vitals reviewed.   BP 98/44 mmHg  Pulse 73  Resp 20  Ht 5\' 1"  (1.549 m)  Wt 229 lb (103.874 kg)  BMI 43.29 kg/m2  SpO2 95%  LMP  (LMP Unknown)       Assessment & Plan:  1: Chronic heart failure with preserved ejection fraction- Patient presents with shortness of breath and fatigue with exertion. She said that she had to stop in the lobby of the building and rest for a few minutes after walking into the building. Symptoms resolved fairly quickly after resting. She continues to weigh herself and reports a stable weight. By our scale, she's gained 2 pounds since July 2016. Reminded to call for an overnight weight gain of >2 pounds or a weekly weight gain of >5 pounds. She is not adding any salt to her foods and is trying to follow a low sodium diet. She does have a little bit of edema in both of her lower legs but she says that they go down overnight. She does try to elevate them some at home during the day but admits that she's not consistent with doing that.  Encouraged her to elevate them when she's going to be sitting for long periods of time. [facility] PharmD reviewed medications with the patient. 2: COPD- Does wear her oxygen at 3L upon exertion as well as at bedtime. Continues to use her inhalers. 3: Diabetes- She says that her glucose two days ago was 156. She continues to take glipizide. Encouraged her to followup with her PCP regarding her diabetes.  Return in 3 months or sooner for any questions/problems before then.

## 2014-12-18 NOTE — Patient Instructions (Signed)
Continue weighing daily and call for an overnight weight gain of > 2 pounds or a weekly weight gain of >5 pounds. 

## 2015-01-15 ENCOUNTER — Other Ambulatory Visit: Payer: Self-pay | Admitting: Family Medicine

## 2015-01-15 DIAGNOSIS — N63 Unspecified lump in unspecified breast: Secondary | ICD-10-CM

## 2015-02-20 ENCOUNTER — Ambulatory Visit: Payer: Medicare PPO | Admitting: Cardiovascular Disease

## 2015-03-19 ENCOUNTER — Ambulatory Visit: Payer: Medicare PPO | Attending: Family | Admitting: Family

## 2015-03-19 ENCOUNTER — Other Ambulatory Visit: Payer: Self-pay | Admitting: Family Medicine

## 2015-03-19 ENCOUNTER — Ambulatory Visit: Payer: Medicare PPO

## 2015-03-19 ENCOUNTER — Ambulatory Visit: Admission: RE | Admit: 2015-03-19 | Payer: Medicare PPO | Source: Ambulatory Visit

## 2015-03-19 ENCOUNTER — Encounter: Payer: Self-pay | Admitting: Family

## 2015-03-19 VITALS — BP 113/60 | HR 73 | Resp 20 | Ht 61.0 in | Wt 225.0 lb

## 2015-03-19 DIAGNOSIS — J449 Chronic obstructive pulmonary disease, unspecified: Secondary | ICD-10-CM | POA: Insufficient documentation

## 2015-03-19 DIAGNOSIS — Z7982 Long term (current) use of aspirin: Secondary | ICD-10-CM | POA: Diagnosis not present

## 2015-03-19 DIAGNOSIS — Z888 Allergy status to other drugs, medicaments and biological substances status: Secondary | ICD-10-CM | POA: Insufficient documentation

## 2015-03-19 DIAGNOSIS — N189 Chronic kidney disease, unspecified: Secondary | ICD-10-CM | POA: Insufficient documentation

## 2015-03-19 DIAGNOSIS — R002 Palpitations: Secondary | ICD-10-CM | POA: Insufficient documentation

## 2015-03-19 DIAGNOSIS — I252 Old myocardial infarction: Secondary | ICD-10-CM | POA: Diagnosis not present

## 2015-03-19 DIAGNOSIS — M069 Rheumatoid arthritis, unspecified: Secondary | ICD-10-CM | POA: Diagnosis not present

## 2015-03-19 DIAGNOSIS — Z881 Allergy status to other antibiotic agents status: Secondary | ICD-10-CM | POA: Insufficient documentation

## 2015-03-19 DIAGNOSIS — I129 Hypertensive chronic kidney disease with stage 1 through stage 4 chronic kidney disease, or unspecified chronic kidney disease: Secondary | ICD-10-CM | POA: Diagnosis not present

## 2015-03-19 DIAGNOSIS — I5032 Chronic diastolic (congestive) heart failure: Secondary | ICD-10-CM | POA: Diagnosis present

## 2015-03-19 DIAGNOSIS — E785 Hyperlipidemia, unspecified: Secondary | ICD-10-CM | POA: Diagnosis not present

## 2015-03-19 DIAGNOSIS — N63 Unspecified lump in unspecified breast: Secondary | ICD-10-CM

## 2015-03-19 DIAGNOSIS — R0602 Shortness of breath: Secondary | ICD-10-CM | POA: Diagnosis not present

## 2015-03-19 DIAGNOSIS — Z87891 Personal history of nicotine dependence: Secondary | ICD-10-CM | POA: Diagnosis not present

## 2015-03-19 DIAGNOSIS — I10 Essential (primary) hypertension: Secondary | ICD-10-CM | POA: Insufficient documentation

## 2015-03-19 DIAGNOSIS — E119 Type 2 diabetes mellitus without complications: Secondary | ICD-10-CM | POA: Diagnosis not present

## 2015-03-19 DIAGNOSIS — I2510 Atherosclerotic heart disease of native coronary artery without angina pectoris: Secondary | ICD-10-CM | POA: Insufficient documentation

## 2015-03-19 DIAGNOSIS — E1159 Type 2 diabetes mellitus with other circulatory complications: Secondary | ICD-10-CM

## 2015-03-19 DIAGNOSIS — J410 Simple chronic bronchitis: Secondary | ICD-10-CM

## 2015-03-19 NOTE — Patient Instructions (Signed)
Continue weighing daily and call for an overnight weight gain of > 2 pounds or a weekly weight gain of >5 pounds. 

## 2015-03-19 NOTE — Progress Notes (Signed)
Subjective:    Patient ID: Toni Marshall, female    DOB: 05-Oct-1945, 70 y.o.   MRN: [id number]  Congestive Heart Failure Presents for follow-up visit. The disease course has been stable. Associated symptoms include edema, fatigue, palpitations (with walking too far or up hill) and shortness of breath. Pertinent negatives include no abdominal pain, chest pain, chest pressure or orthopnea. The symptoms have been stable. Past treatments include salt and fluid restriction, beta blockers and ACE inhibitors. The treatment provided moderate relief. Compliance with prior treatments has been good. Her past medical history is significant for CAD, chronic lung disease, DM and HTN. She has multiple 1st degree relatives with heart disease. Compliance with total regimen is 76-100%.  Hypertension This is a chronic problem. The current episode started more than 1 year ago. The problem is unchanged. The problem is controlled. Associated symptoms include palpitations (with walking too far or up hill), peripheral edema and shortness of breath. Pertinent negatives include no chest pain, headaches or neck pain. There are no associated agents to hypertension. Risk factors for coronary artery disease include diabetes mellitus, dyslipidemia, family history, post-menopausal state and sedentary lifestyle. Past treatments include beta blockers, ACE inhibitors, diuretics and lifestyle changes. The current treatment provides moderate improvement. Compliance problems include exercise.  Hypertensive end-organ damage includes kidney disease, CAD/MI and heart failure.    Past Medical History  Diagnosis Date  . H/O acute respiratory failure   . CHF (congestive heart failure) (HCC)     chronic systolic dysfunction with EF of 45%  . NSTEMI (non-ST elevated myocardial infarction) (HCC)   . CAD (coronary artery disease)   . Hypertension   . Cardiogenic shock (HCC)   . Hyperlipidemia   . Glaucoma   . Rheumatoid arthritis(714.0)    . Diabetes mellitus     Type II  . COPD (chronic obstructive pulmonary disease) (HCC)   . Chronic kidney disease     Past Surgical History  Procedure Laterality Date  . Cardiac catheterization      x2 [facility]  . Cardiac catheterization      [facility]  . Coronary artery bypass graft  2012  . Coronary artery bypass graft  2002  . Eye surgery      left  . Colonoscopy      Family History  Problem Relation Age of Onset  . Heart attack Mother   . Stroke Sister   . Hypertension Father   . Diabetes Father     Social History  Substance Use Topics  . Smoking status: Former Smoker -- 0.50 packs/day for 15 years    Types: Cigarettes  . Smokeless tobacco: Never Used  . Alcohol Use: No    Allergies  Allergen Reactions  . Vancomycin     Hypotension & hypoxia     Prior to Admission medications   Medication Sig Start Date End Date Taking? Authorizing Provider  aspirin 81 MG tablet Take 81 mg by mouth daily.   Yes Historical Provider, MD  atorvastatin (LIPITOR) 80 MG tablet TAKE 1 TABLET (80 MG TOTAL) BY MOUTH DAILY. 12/03/14  Yes Antonieta Iba, MD  Brimonidine Tartrate-Timolol (COMBIGAN OP) Apply to eye 2 (two) times daily.    Yes Historical Provider, MD  CALCIUM PLUS VITAMIN D3 600-500 MG-UNIT CAPS Take 1 tablet by mouth 2 (two) times daily.  04/10/14  Yes Historical Provider, MD  carvedilol (COREG) 3.125 MG tablet Take 3.125 mg by mouth 2 (two) times daily with a meal.   Yes Historical  Provider, MD  clopidogrel (PLAVIX) 75 MG tablet Take 75 mg by mouth daily.   Yes Historical Provider, MD  dorzolamide (TRUSOPT) 2 % ophthalmic solution Place 1 drop into the left eye 2 (two) times daily.   Yes Historical Provider, MD  folic acid (FOLVITE) 400 MCG tablet Take 400 mcg by mouth daily.   Yes Historical Provider, MD  furosemide (LASIX) 20 MG tablet Take 20 mg by mouth every other day.    Yes Historical Provider, MD  glipiZIDE (GLUCOTROL XL) 2.5 MG 24 hr tablet Take 2.5 mg by mouth daily  with breakfast.   Yes Historical Provider, MD  isosorbide mononitrate (IMDUR) 30 MG 24 hr tablet TAKE 1 TABLET BY MOUTH EVERY DAY 10/26/14  Yes Antonieta Iba, MD  lisinopril (PRINIVIL,ZESTRIL) 5 MG tablet Take 1 tablet (5 mg total) by mouth daily. 07/26/14  Yes Delma Freeze, FNP  magnesium oxide (MAG-OX) 400 MG tablet Take 400 mg by mouth 2 (two) times daily.    Yes Historical Provider, MD  meclizine (ANTIVERT) 25 MG tablet Take 25 mg by mouth 3 (three) times daily as needed for dizziness.   Yes Historical Provider, MD  naproxen (NAPROSYN) 500 MG tablet Take 500 mg by mouth 2 (two) times daily with a meal.   Yes Historical Provider, MD  potassium chloride SA (K-DUR,KLOR-CON) 20 MEQ tablet Take 10 mEq by mouth every other day.    Yes Historical Provider, MD  albuterol (PROVENTIL HFA;VENTOLIN HFA) 108 (90 BASE) MCG/ACT inhaler Inhale 2 puffs into the lungs every 6 (six) hours as needed for wheezing or shortness of breath. Reported on 03/19/2015    Historical Provider, MD     Review of Systems  Constitutional: Positive for fatigue. Negative for appetite change.  HENT: Positive for postnasal drip and sore throat. Negative for congestion.   Eyes: Negative.   Respiratory: Positive for shortness of breath. Negative for cough, chest tightness and wheezing.   Cardiovascular: Positive for palpitations (with walking too far or up hill) and leg swelling (resolves overnight). Negative for chest pain.  Gastrointestinal: Negative for abdominal pain and abdominal distention.  Endocrine: Negative.   Genitourinary: Negative.   Musculoskeletal: Negative for back pain and neck pain.  Skin: Negative.   Allergic/Immunologic: Negative.   Neurological: Positive for dizziness. Negative for light-headedness and headaches.  Hematological: Negative for adenopathy. Does not bruise/bleed easily.  Psychiatric/Behavioral: Negative for sleep disturbance (sleeping on 2 pillows. Wears oxygen on 3L) and dysphoric mood. The  patient is not nervous/anxious.        Objective:   Physical Exam  Constitutional: She is oriented to person, place, and time. She appears well-developed and well-nourished.  HENT:  Head: Normocephalic and atraumatic.  Eyes: Conjunctivae are normal. Pupils are equal, round, and reactive to light.  Neck: Normal range of motion. Neck supple.  Cardiovascular: Normal rate and regular rhythm.   Pulmonary/Chest: Effort normal. She has no wheezes. She has no rales.  Abdominal: Soft. She exhibits no distension. There is no tenderness.  Musculoskeletal: She exhibits no edema or tenderness.  Neurological: She is alert and oriented to person, place, and time.  Skin: Skin is warm and dry.  Psychiatric: She has a normal mood and affect. Her behavior is normal. Thought content normal.  Nursing note and vitals reviewed.   BP 113/60 mmHg  Pulse 73  Resp 20  Ht 5\' 1"  (1.549 m)  Wt 225 lb (102.059 kg)  BMI 42.54 kg/m2  SpO2 95%  LMP  (LMP Unknown)  Assessment & Plan:  1: Chronic heart failure with preserved ejection fraction- Patient presents with fatigue and shortness of breath upon exertion. She says that she was quite short of breath upon walking into the office because she had to park so far away from the front. Once she sat down for a few minutes, her breathing quickly improved. She continues to weigh herself and says that her weight has been stable. By our scales, she has lost 4 pounds since October 2016. Reminded to call for an overnight weight gain of >2 pounds or a weekly weight gain of >5 pounds. She does add a little salt to her food when she cooks it but doesn't add any salt afterwards. Encouraged her to not add any salt either when cooking or afterwards. She does report having some swelling in her lower legs at the end of the day which then resolves by morning. Encouraged her to elevate her legs as much as possible when she's at home. [facility] PharmD went in and reviewed medications  with the patient. She sees her cardiologist 04/03/15. 2: HTN- Blood pressure looks good today. Continue medications at this time. 3: COPD- Continues to wear her oxygen at 3L along with her inhaler if needed. No wheezing heard today. 4: Diabetes- She says that she didn't check her glucose this morning but when she checked it yesterday, it was 163. She follows with her PCP regarding this.  Return here in 3 months or sooner for any questions/problems before then.

## 2015-03-20 ENCOUNTER — Ambulatory Visit: Payer: Medicare PPO | Admitting: Family

## 2015-04-01 ENCOUNTER — Ambulatory Visit
Admission: RE | Admit: 2015-04-01 | Discharge: 2015-04-01 | Disposition: A | Discharge status: Home / Self Care | Payer: Medicare PPO | Source: Ambulatory Visit | Attending: Family Medicine | Admitting: Family Medicine

## 2015-04-01 ENCOUNTER — Other Ambulatory Visit: Payer: Self-pay | Admitting: Family Medicine

## 2015-04-01 DIAGNOSIS — N63 Unspecified lump in unspecified breast: Secondary | ICD-10-CM

## 2015-04-03 ENCOUNTER — Ambulatory Visit (INDEPENDENT_AMBULATORY_CARE_PROVIDER_SITE_OTHER): Payer: Medicare PPO | Admitting: Cardiovascular Disease

## 2015-04-03 ENCOUNTER — Encounter: Payer: Self-pay | Admitting: Cardiovascular Disease

## 2015-04-03 VITALS — BP 124/80 | HR 80 | Ht 61.0 in | Wt 225.8 lb

## 2015-04-03 DIAGNOSIS — I25810 Atherosclerosis of coronary artery bypass graft(s) without angina pectoris: Secondary | ICD-10-CM | POA: Diagnosis not present

## 2015-04-03 DIAGNOSIS — I5032 Chronic diastolic (congestive) heart failure: Secondary | ICD-10-CM | POA: Diagnosis not present

## 2015-04-03 DIAGNOSIS — J410 Simple chronic bronchitis: Secondary | ICD-10-CM | POA: Diagnosis not present

## 2015-04-03 DIAGNOSIS — I10 Essential (primary) hypertension: Secondary | ICD-10-CM

## 2015-04-03 DIAGNOSIS — E785 Hyperlipidemia, unspecified: Secondary | ICD-10-CM

## 2015-04-03 NOTE — Assessment & Plan Note (Signed)
Blood pressure is well controlled on today's visit. No changes made to the medications. 

## 2015-04-03 NOTE — Assessment & Plan Note (Signed)
Currently on oxygen, no recent COPD exacerbation

## 2015-04-03 NOTE — Assessment & Plan Note (Signed)
No recent lipid panel available.  encouraged her to stay on her  Lipitor

## 2015-04-03 NOTE — Assessment & Plan Note (Signed)
We have encouraged continued exercise, careful diet management in an effort to lose weight. 

## 2015-04-03 NOTE — Patient Instructions (Signed)
You are doing well. No medication changes were made.  Please call us if you have new issues that need to be addressed before your next appt.  Your physician wants you to follow-up in: 6 months.  You will receive a reminder letter in the mail two months in advance. If you don't receive a letter, please call our office to schedule the follow-up appointment.   

## 2015-04-03 NOTE — Progress Notes (Signed)
Patient ID: Toni Marshall, female    DOB: [birth year], 70 y.o.   MRN: [id number]  HPI Comments: Toni Marshall is a very pleasant 70 year old woman with history of coronary artery disease, bypass surgery in 2002, repeat bypass November 2012 at [facility], diabetes, hypertension, hyperlipidemia, obesity with non-STEMI 10/29/2011 with presentation to [facility].   She is seen at the [facility] clinic. Her previous anginal symptoms included shortness of breath, possibly indigestion Long history of smoking, 16 years of working in a [employer] needs 3 L oxygen,  She presents today for follow-up of her chronic diastolic CHF   in follow-up today, she reports that she is doing relatively well. She continues to use 2 L of oxygen at all times.  Denies any significant weight gain , no worsening leg edema or abdominal bloating  denies any significant chest pain concerning for angina   On previous visits , weight at home 223 pounds, weight today 225 pounds  no regular exercise program, trying to watch her diet  EKG on today's visit shows normal sinus rhythm with rate 80 bpm, no significant ST or T-wave changes   Other past medical history admitted February 25 with discharge 04/23/2014. Admitted with shortness of breath. No significant improvement in symptoms with diuresis, climb in her creatinine and BUN. 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  Echocardiogram dated 04/05/2014 shows ejection fraction of 60-65%, diastolic relaxation abnormality, otherwise a normal study Cardiac stress test 04/06/2014 for atypical chest pain showed severe scar in the anterior and apical territory, ejection fraction 50%   hospitalization 04/05/2014 for chest pain. She ruled out for MI, had stress test showing no significant ischemia. Significant artifact noted. Symptoms were felt to be somewhat atypical in nature  Typically  total cholesterol 130 Previous long complicated hospital course with acute respiratory failure, bibasilar pneumonia. She is on a ventilator and required prolonged resuscitation.  Catheterization in 10/30/2011 showed severe three-vessel coronary artery disease, patent vein graft to the RCA, occluded vein graft to the OM and LIMA to the LAD. Attempted LAD PCI which was unsuccessful.  Left main is moderately calcified, 99% mid LAD followed by a 90% lesion, 50% distal LAD disease, diagonal #2 had 90% ostial disease, 90% mid circumflex, proximal 60% RCA disease, LIMA graft with 100% stenosis at the graft ostium, saphenous vein graft to the OM with 100% ostial graft disease, vein graft to the distal RCA showing 30% proximal graft disease, 40% mid graft disease, ejection fraction 40%  Previous Echocardiogram showing ejection fraction 40-45%, apical akinesis with severe distal anterior and distal inferior wall hypokinesis, mild MR echocardiogram in the hospital 09/16/2012 showed ejection fraction 50-55%, otherwise normal study   Allergies  Allergen Reactions  . Vancomycin     Hypotension & hypoxia     Outpatient Encounter Prescriptions as of 04/03/2015  Medication Sig  . albuterol (PROVENTIL HFA;VENTOLIN HFA) 108 (90 BASE) MCG/ACT inhaler Inhale 2 puffs into the lungs every 6 (six) hours as needed for wheezing or shortness of breath. Reported on 03/19/2015  . aspirin 81 MG tablet Take 81 mg by mouth daily.  Marland Kitchen atorvastatin (LIPITOR) 80 MG tablet TAKE 1 TABLET (80 MG TOTAL) BY MOUTH DAILY.  . Brimonidine Tartrate-Timolol (COMBIGAN OP) Apply to eye 2 (two) times daily.   Marland Kitchen CALCIUM PLUS VITAMIN D3 600-500 MG-UNIT CAPS Take 1 tablet by mouth 2 (two) times daily.   . carvedilol (COREG)  3.125 MG tablet Take 3.125 mg by mouth 2 (two) times daily with a meal.  . clopidogrel (PLAVIX) 75 MG tablet Take 75 mg by mouth daily.  . dorzolamide (TRUSOPT) 2 % ophthalmic solution Place 1 drop into the left eye 2 (two)  times daily.  . folic acid (FOLVITE) 400 MCG tablet Take 400 mcg by mouth daily.  . furosemide (LASIX) 20 MG tablet Take 20 mg by mouth every other day.   Marland Kitchen glipiZIDE (GLUCOTROL XL) 2.5 MG 24 hr tablet Take 2.5 mg by mouth daily with breakfast.  . isosorbide mononitrate (IMDUR) 30 MG 24 hr tablet TAKE 1 TABLET BY MOUTH EVERY DAY  . lisinopril (PRINIVIL,ZESTRIL) 5 MG tablet Take 1 tablet (5 mg total) by mouth daily.  . magnesium oxide (MAG-OX) 400 MG tablet Take 400 mg by mouth 2 (two) times daily.   . meclizine (ANTIVERT) 25 MG tablet Take 25 mg by mouth 3 (three) times daily as needed for dizziness.  . naproxen (NAPROSYN) 500 MG tablet Take 500 mg by mouth 2 (two) times daily with a meal.  . potassium chloride SA (K-DUR,KLOR-CON) 20 MEQ tablet Take 10 mEq by mouth every other day.    No facility-administered encounter medications on file as of 04/03/2015.    Past Medical History  Diagnosis Date  . H/O acute respiratory failure   . CHF (congestive heart failure) (HCC)     chronic systolic dysfunction with EF of 45%  . NSTEMI (non-ST elevated myocardial infarction) (HCC)   . CAD (coronary artery disease)   . Hypertension   . Cardiogenic shock (HCC)   . Hyperlipidemia   . Glaucoma   . Rheumatoid arthritis(714.0)   . Diabetes mellitus     Type II  . COPD (chronic obstructive pulmonary disease) (HCC)   . Chronic kidney disease     Past Surgical History  Procedure Laterality Date  . Cardiac catheterization      x2 [facility]  . Cardiac catheterization      [facility]  . Coronary artery bypass graft  2012  . Coronary artery bypass graft  2002  . Eye surgery      left  . Colonoscopy      Social History  reports that she has quit smoking. Her smoking use included Cigarettes. She has a 7.5 pack-year smoking history. She has never used smokeless tobacco. She reports that she does not drink alcohol or use illicit drugs.  Family History family history includes Diabetes in her father; Heart  attack in her mother; Hypertension in her father; Stroke in her sister.  Review of Systems  Constitutional: Negative.   Respiratory: Negative.   Cardiovascular: Negative.   Gastrointestinal: Negative.   Musculoskeletal: Negative.   Skin: Negative.   Neurological: Negative.   Hematological: Negative.   Psychiatric/Behavioral: Negative.   All other systems reviewed and are negative.  BP 124/80 mmHg  Pulse 80  Ht 5\' 1"  (1.549 m)  Wt 225 lb 12 oz (102.4 kg)  BMI 42.68 kg/m2  LMP  (LMP Unknown)  Physical Exam  Constitutional: She is oriented to person, place, and time. She appears well-developed and well-nourished.  obese  HENT:  Head: Normocephalic.  Nose: Nose normal.  Mouth/Throat: Oropharynx is clear and moist.  Eyes: Conjunctivae are normal. Pupils are equal, round, and reactive to light.  Neck: Normal range of motion. Neck supple. No JVD present.  Cardiovascular: Normal rate, regular rhythm, S1 normal, S2 normal, normal heart sounds and intact distal pulses.  Exam reveals no  gallop and no friction rub.   No murmur heard. Pulmonary/Chest: Effort normal and breath sounds normal. No respiratory distress. She has no wheezes. She has no rales. She exhibits no tenderness.  Abdominal: Soft. Bowel sounds are normal. She exhibits no distension. There is no tenderness.  Musculoskeletal: Normal range of motion. She exhibits no edema or tenderness.  Lymphadenopathy:    She has no cervical adenopathy.  Neurological: She is alert and oriented to person, place, and time. Coordination normal.  Skin: Skin is warm and dry. No rash noted. No erythema.  Psychiatric: She has a normal mood and affect. Her behavior is normal. Judgment and thought content normal.    Assessment and Plan  Nursing note and vitals reviewed.

## 2015-05-26 ENCOUNTER — Observation Stay
Admission: EM | Admit: 2015-05-26 | Discharge: 2015-05-28 | Disposition: A | Discharge status: Home / Self Care | Payer: Medicare PPO | Attending: Internal Medicine | Admitting: Internal Medicine

## 2015-05-26 ENCOUNTER — Encounter: Payer: Self-pay | Admitting: Emergency Medicine

## 2015-05-26 ENCOUNTER — Emergency Department: Payer: Medicare PPO

## 2015-05-26 DIAGNOSIS — J9610 Chronic respiratory failure, unspecified whether with hypoxia or hypercapnia: Secondary | ICD-10-CM | POA: Diagnosis not present

## 2015-05-26 DIAGNOSIS — I252 Old myocardial infarction: Secondary | ICD-10-CM | POA: Diagnosis not present

## 2015-05-26 DIAGNOSIS — Z6841 Body Mass Index (BMI) 40.0 and over, adult: Secondary | ICD-10-CM | POA: Diagnosis not present

## 2015-05-26 DIAGNOSIS — R6889 Other general symptoms and signs: Secondary | ICD-10-CM | POA: Diagnosis present

## 2015-05-26 DIAGNOSIS — R509 Fever, unspecified: Secondary | ICD-10-CM | POA: Insufficient documentation

## 2015-05-26 DIAGNOSIS — R0602 Shortness of breath: Secondary | ICD-10-CM

## 2015-05-26 DIAGNOSIS — H409 Unspecified glaucoma: Secondary | ICD-10-CM | POA: Diagnosis not present

## 2015-05-26 DIAGNOSIS — M069 Rheumatoid arthritis, unspecified: Secondary | ICD-10-CM | POA: Diagnosis not present

## 2015-05-26 DIAGNOSIS — Z87891 Personal history of nicotine dependence: Secondary | ICD-10-CM | POA: Diagnosis not present

## 2015-05-26 DIAGNOSIS — E1122 Type 2 diabetes mellitus with diabetic chronic kidney disease: Secondary | ICD-10-CM | POA: Insufficient documentation

## 2015-05-26 DIAGNOSIS — I2510 Atherosclerotic heart disease of native coronary artery without angina pectoris: Secondary | ICD-10-CM | POA: Insufficient documentation

## 2015-05-26 DIAGNOSIS — I5032 Chronic diastolic (congestive) heart failure: Secondary | ICD-10-CM | POA: Diagnosis present

## 2015-05-26 DIAGNOSIS — Z7982 Long term (current) use of aspirin: Secondary | ICD-10-CM | POA: Diagnosis not present

## 2015-05-26 DIAGNOSIS — R918 Other nonspecific abnormal finding of lung field: Secondary | ICD-10-CM | POA: Diagnosis not present

## 2015-05-26 DIAGNOSIS — E785 Hyperlipidemia, unspecified: Secondary | ICD-10-CM | POA: Insufficient documentation

## 2015-05-26 DIAGNOSIS — N189 Chronic kidney disease, unspecified: Secondary | ICD-10-CM | POA: Diagnosis not present

## 2015-05-26 DIAGNOSIS — Z7902 Long term (current) use of antithrombotics/antiplatelets: Secondary | ICD-10-CM | POA: Diagnosis not present

## 2015-05-26 DIAGNOSIS — J849 Interstitial pulmonary disease, unspecified: Secondary | ICD-10-CM | POA: Insufficient documentation

## 2015-05-26 DIAGNOSIS — I129 Hypertensive chronic kidney disease with stage 1 through stage 4 chronic kidney disease, or unspecified chronic kidney disease: Secondary | ICD-10-CM | POA: Diagnosis not present

## 2015-05-26 DIAGNOSIS — J449 Chronic obstructive pulmonary disease, unspecified: Secondary | ICD-10-CM | POA: Insufficient documentation

## 2015-05-26 DIAGNOSIS — J9611 Chronic respiratory failure with hypoxia: Secondary | ICD-10-CM

## 2015-05-26 DIAGNOSIS — R079 Chest pain, unspecified: Secondary | ICD-10-CM | POA: Diagnosis not present

## 2015-05-26 DIAGNOSIS — Z881 Allergy status to other antibiotic agents status: Secondary | ICD-10-CM | POA: Insufficient documentation

## 2015-05-26 DIAGNOSIS — Z7984 Long term (current) use of oral hypoglycemic drugs: Secondary | ICD-10-CM | POA: Insufficient documentation

## 2015-05-26 DIAGNOSIS — Z951 Presence of aortocoronary bypass graft: Secondary | ICD-10-CM | POA: Insufficient documentation

## 2015-05-26 DIAGNOSIS — Z9981 Dependence on supplemental oxygen: Secondary | ICD-10-CM | POA: Insufficient documentation

## 2015-05-26 DIAGNOSIS — Z79899 Other long term (current) drug therapy: Secondary | ICD-10-CM | POA: Insufficient documentation

## 2015-05-26 DIAGNOSIS — I5042 Chronic combined systolic (congestive) and diastolic (congestive) heart failure: Secondary | ICD-10-CM | POA: Diagnosis not present

## 2015-05-26 DIAGNOSIS — R0781 Pleurodynia: Secondary | ICD-10-CM

## 2015-05-26 LAB — CBC
HEMATOCRIT: 43.8 % (ref 35.0–47.0)
HEMOGLOBIN: 14.4 g/dL (ref 12.0–16.0)
MCH: 28.1 pg (ref 26.0–34.0)
MCHC: 32.8 g/dL (ref 32.0–36.0)
MCV: 85.5 fL (ref 80.0–100.0)
Platelets: 229 10*3/uL (ref 150–440)
RBC: 5.12 MIL/uL (ref 3.80–5.20)
RDW: 15.2 % — ABNORMAL HIGH (ref 11.5–14.5)
WBC: 7.7 10*3/uL (ref 3.6–11.0)

## 2015-05-26 LAB — TROPONIN I: Troponin I: <0.03 ng/mL (ref <0.031)

## 2015-05-26 LAB — BASIC METABOLIC PANEL
ANION GAP: 9 (ref 5–15)
BUN: 26 mg/dL — ABNORMAL HIGH (ref 6–20)
CALCIUM: 9.4 mg/dL (ref 8.9–10.3)
CO2: 23 mmol/L (ref 22–32)
Chloride: 103 mmol/L (ref 101–111)
Creatinine, Ser: 1.47 mg/dL — ABNORMAL HIGH (ref 0.44–1.00)
GFR, EST AFRICAN AMERICAN: 41 mL/min — AB (ref >60)
GFR, EST NON AFRICAN AMERICAN: 35 mL/min — AB (ref >60)
GLUCOSE: 219 mg/dL — AB (ref 65–99)
POTASSIUM: 3.4 mmol/L — AB (ref 3.5–5.1)
SODIUM: 135 mmol/L (ref 135–145)

## 2015-05-26 LAB — BRAIN NATRIURETIC PEPTIDE: B NATRIURETIC PEPTIDE 5: 96 pg/mL (ref 0.0–100.0)

## 2015-05-26 MED ORDER — DOCUSATE SODIUM 100 MG PO CAPS
100.0000 mg | ORAL_CAPSULE | Freq: Two times a day (BID) | ORAL | Status: DC
  Administered 2015-05-26 – 2015-05-28 (×2): 100 mg via ORAL
  Filled 2015-05-26 (×3): qty 1

## 2015-05-26 MED ORDER — MAGNESIUM OXIDE 400 (241.3 MG) MG PO TABS
400.0000 mg | ORAL_TABLET | Freq: Two times a day (BID) | ORAL | Status: DC
  Administered 2015-05-26 – 2015-05-28 (×4): 400 mg via ORAL
  Filled 2015-05-26 (×4): qty 1

## 2015-05-26 MED ORDER — BRIMONIDINE TARTRATE-TIMOLOL 0.2-0.5 % OP SOLN: 1.0000 [drp] | Freq: Two times a day (BID) | OPHTHALMIC | Status: DC

## 2015-05-26 MED ORDER — ACETAMINOPHEN 650 MG RE SUPP: 650.0000 mg | Freq: Four times a day (QID) | RECTAL | Status: DC | PRN

## 2015-05-26 MED ORDER — ATORVASTATIN CALCIUM 20 MG PO TABS
80.0000 mg | ORAL_TABLET | Freq: Every day | ORAL | Status: DC
  Administered 2015-05-27: 80 mg via ORAL
  Filled 2015-05-26: qty 4

## 2015-05-26 MED ORDER — BRIMONIDINE TARTRATE 0.2 % OP SOLN
1.0000 [drp] | Freq: Two times a day (BID) | OPHTHALMIC | Status: DC
  Administered 2015-05-26 – 2015-05-28 (×4): 1 [drp] via OPHTHALMIC
  Filled 2015-05-26: qty 5

## 2015-05-26 MED ORDER — ONDANSETRON HCL 4 MG/2ML IJ SOLN: 4.0000 mg | Freq: Four times a day (QID) | INTRAMUSCULAR | Status: DC | PRN

## 2015-05-26 MED ORDER — GLIPIZIDE ER 2.5 MG PO TB24
2.5000 mg | ORAL_TABLET | Freq: Every day | ORAL | Status: DC
  Administered 2015-05-27 – 2015-05-28 (×2): 2.5 mg via ORAL
  Filled 2015-05-26 (×2): qty 1

## 2015-05-26 MED ORDER — HEPARIN SODIUM (PORCINE) 5000 UNIT/ML IJ SOLN
5000.0000 [IU] | Freq: Three times a day (TID) | INTRAMUSCULAR | Status: DC
Start: 2015-05-26 — End: 2015-05-28
  Administered 2015-05-26 – 2015-05-27 (×4): 5000 [IU] via SUBCUTANEOUS
  Filled 2015-05-26 (×4): qty 1

## 2015-05-26 MED ORDER — ISOSORBIDE MONONITRATE ER 30 MG PO TB24
30.0000 mg | ORAL_TABLET | Freq: Every day | ORAL | Status: DC
  Administered 2015-05-27 – 2015-05-28 (×2): 30 mg via ORAL
  Filled 2015-05-26 (×2): qty 1

## 2015-05-26 MED ORDER — ACETAMINOPHEN 325 MG PO TABS: 650.0000 mg | ORAL_TABLET | Freq: Four times a day (QID) | ORAL | Status: DC | PRN

## 2015-05-26 MED ORDER — FUROSEMIDE 20 MG PO TABS
20.0000 mg | ORAL_TABLET | ORAL | Status: DC
  Administered 2015-05-27: 20 mg via ORAL
  Filled 2015-05-26: qty 1

## 2015-05-26 MED ORDER — CARVEDILOL 3.125 MG PO TABS
3.1250 mg | ORAL_TABLET | Freq: Two times a day (BID) | ORAL | Status: DC
  Administered 2015-05-27 – 2015-05-28 (×3): 3.125 mg via ORAL
  Filled 2015-05-26 (×3): qty 1

## 2015-05-26 MED ORDER — SODIUM CHLORIDE 0.9% FLUSH
3.0000 mL | Freq: Two times a day (BID) | INTRAVENOUS | Status: DC
  Administered 2015-05-26 – 2015-05-27 (×2): 3 mL via INTRAVENOUS

## 2015-05-26 MED ORDER — ONDANSETRON HCL 4 MG PO TABS: 4.0000 mg | ORAL_TABLET | Freq: Four times a day (QID) | ORAL | Status: DC | PRN

## 2015-05-26 MED ORDER — CLOPIDOGREL BISULFATE 75 MG PO TABS
75.0000 mg | ORAL_TABLET | Freq: Every day | ORAL | Status: DC
  Administered 2015-05-27 – 2015-05-28 (×2): 75 mg via ORAL
  Filled 2015-05-26 (×2): qty 1

## 2015-05-26 MED ORDER — ALBUTEROL SULFATE (2.5 MG/3ML) 0.083% IN NEBU: 3.0000 mL | INHALATION_SOLUTION | Freq: Four times a day (QID) | RESPIRATORY_TRACT | Status: DC | PRN

## 2015-05-26 MED ORDER — BISACODYL 10 MG RE SUPP: 10.0000 mg | Freq: Every day | RECTAL | Status: DC | PRN

## 2015-05-26 MED ORDER — TIMOLOL MALEATE 0.5 % OP SOLN
1.0000 [drp] | Freq: Two times a day (BID) | OPHTHALMIC | Status: DC
  Administered 2015-05-26 – 2015-05-28 (×4): 1 [drp] via OPHTHALMIC
  Filled 2015-05-26: qty 5

## 2015-05-26 MED ORDER — FOLIC ACID 1 MG PO TABS
500.0000 ug | ORAL_TABLET | Freq: Every day | ORAL | Status: DC
  Administered 2015-05-27 – 2015-05-28 (×2): 0.5 mg via ORAL
  Filled 2015-05-26 (×2): qty 1

## 2015-05-26 MED ORDER — POTASSIUM CHLORIDE IN NACL 20-0.9 MEQ/L-% IV SOLN
INTRAVENOUS | Status: DC
  Administered 2015-05-26: 1 mL via INTRAVENOUS
  Administered 2015-05-27: 20:00:00 via INTRAVENOUS
  Filled 2015-05-26 (×3): qty 1000

## 2015-05-26 MED ORDER — DORZOLAMIDE HCL 2 % OP SOLN
1.0000 [drp] | Freq: Two times a day (BID) | OPHTHALMIC | Status: DC
  Administered 2015-05-26 – 2015-05-28 (×4): 1 [drp] via OPHTHALMIC
  Filled 2015-05-26: qty 10

## 2015-05-26 MED ORDER — LISINOPRIL 5 MG PO TABS
5.0000 mg | ORAL_TABLET | Freq: Every day | ORAL | Status: DC
  Administered 2015-05-27 – 2015-05-28 (×2): 5 mg via ORAL
  Filled 2015-05-26 (×2): qty 1

## 2015-05-26 MED ORDER — MORPHINE SULFATE (PF) 2 MG/ML IV SOLN: 2.0000 mg | INTRAVENOUS | Status: DC | PRN

## 2015-05-26 MED ORDER — POTASSIUM CHLORIDE CRYS ER 10 MEQ PO TBCR
10.0000 meq | EXTENDED_RELEASE_TABLET | ORAL | Status: DC
  Administered 2015-05-26 – 2015-05-27 (×3): 10 meq via ORAL
  Filled 2015-05-26 (×4): qty 1

## 2015-05-26 MED ORDER — ASPIRIN EC 81 MG PO TBEC
81.0000 mg | DELAYED_RELEASE_TABLET | Freq: Every day | ORAL | Status: DC
  Administered 2015-05-27 – 2015-05-28 (×2): 81 mg via ORAL
  Filled 2015-05-26 (×2): qty 1

## 2015-05-26 NOTE — Care Management Obs Status (Signed)
MEDICARE OBSERVATION STATUS NOTIFICATION   Patient Details  Name: Toni Marshall MRN: [id number] Date of Birth: Jul 11, 1945   Medicare Observation Status Notification Given:  Yes (chest pain)    Caren Macadam, RN 05/26/2015, 6:44 PM

## 2015-05-26 NOTE — H&P (Signed)
History and Physical    Toni Marshall TTS:[id number] DOB: 1946-01-21 DOA: 05/26/2015  Referring physician: Dr. Sharma Covert PCP: Emogene Morgan, MD  Specialists: Dr. Mariah Milling  Chief Complaint: CP  HPI: Toni Marshall is a 70 y.o. female has a past medical history significant for ASCVD s/p CABG, chronic diastolic CHF, interstitial lung disease, and chronic respiratory failure on 3L O2 at home now with 4 day hx of intermittent CP and SOB. Sx's worse with movement. No worsening of her chronic LE edema or O2 requirements. Initial troponin OK. She is now admitted. Currently pain free. The pain is sometimes pleuritic in nature with no radiation or N/V. Does get SOB with CP. No fever or cough  Review of Systems: The patient denies anorexia, fever, weight loss,, vision loss, decreased hearing, hoarseness,  syncope,  balance deficits, hemoptysis, abdominal pain, melena, hematochezia, severe indigestion/heartburn, hematuria, incontinence, genital sores, muscle weakness, suspicious skin lesions, transient blindness, difficulty walking, depression, unusual weight change, abnormal bleeding, enlarged lymph nodes, angioedema, and breast masses.   Past Medical History  Diagnosis Date  . H/O acute respiratory failure   . CHF (congestive heart failure) (HCC)     chronic systolic dysfunction with EF of 45%  . NSTEMI (non-ST elevated myocardial infarction) (HCC)   . CAD (coronary artery disease)   . Hypertension   . Cardiogenic shock (HCC)   . Hyperlipidemia   . Glaucoma   . Rheumatoid arthritis(714.0)   . Diabetes mellitus     Type II  . COPD (chronic obstructive pulmonary disease) (HCC)   . Chronic kidney disease    Past Surgical History  Procedure Laterality Date  . Cardiac catheterization      x2 [facility]  . Cardiac catheterization      [facility]  . Coronary artery bypass graft  2012  . Coronary artery bypass graft  2002  . Eye surgery      left  . Colonoscopy     Social History:  reports that she has  quit smoking. Her smoking use included Cigarettes. She has a 7.5 pack-year smoking history. She has never used smokeless tobacco. She reports that she does not drink alcohol or use illicit drugs.  Allergies  Allergen Reactions  . Vancomycin     Hypotension & hypoxia     Family History  Problem Relation Age of Onset  . Heart attack Mother   . Stroke Sister   . Hypertension Father   . Diabetes Father     Prior to Admission medications   Medication Sig Start Date End Date Taking? Authorizing Provider  OXYGEN Inhale 3 L/min into the lungs continuous.   Yes Historical Provider, MD  albuterol (PROVENTIL HFA;VENTOLIN HFA) 108 (90 BASE) MCG/ACT inhaler Inhale 2 puffs into the lungs every 6 (six) hours as needed for wheezing or shortness of breath. Reported on 03/19/2015    Historical Provider, MD  atorvastatin (LIPITOR) 80 MG tablet TAKE 1 TABLET (80 MG TOTAL) BY MOUTH DAILY. 12/03/14   Antonieta Iba, MD  Brimonidine Tartrate-Timolol (COMBIGAN OP) Apply to eye 2 (two) times daily.     Historical Provider, MD  CALCIUM PLUS VITAMIN D3 600-500 MG-UNIT CAPS Take 1 tablet by mouth 2 (two) times daily.  04/10/14   Historical Provider, MD  carvedilol (COREG) 3.125 MG tablet Take 3.125 mg by mouth 2 (two) times daily with a meal.    Historical Provider, MD  clopidogrel (PLAVIX) 75 MG tablet Take 75 mg by mouth daily.    Historical  Provider, MD  dorzolamide (TRUSOPT) 2 % ophthalmic solution Place 1 drop into the left eye 2 (two) times daily.    Historical Provider, MD  folic acid (FOLVITE) 400 MCG tablet Take 400 mcg by mouth daily.    Historical Provider, MD  furosemide (LASIX) 20 MG tablet Take 20 mg by mouth every other day.     Historical Provider, MD  glipiZIDE (GLUCOTROL XL) 2.5 MG 24 hr tablet Take 2.5 mg by mouth daily with breakfast.    Historical Provider, MD  isosorbide mononitrate (IMDUR) 30 MG 24 hr tablet TAKE 1 TABLET BY MOUTH EVERY DAY 10/26/14   Antonieta Iba, MD  lisinopril  (PRINIVIL,ZESTRIL) 5 MG tablet Take 1 tablet (5 mg total) by mouth daily. 07/26/14   Delma Freeze, FNP  magnesium oxide (MAG-OX) 400 MG tablet Take 400 mg by mouth 2 (two) times daily.     Historical Provider, MD  meclizine (ANTIVERT) 25 MG tablet Take 25 mg by mouth 3 (three) times daily as needed for dizziness.    Historical Provider, MD  naproxen (NAPROSYN) 500 MG tablet Take 500 mg by mouth 2 (two) times daily with a meal.    Historical Provider, MD  potassium chloride SA (K-DUR,KLOR-CON) 20 MEQ tablet Take 10 mEq by mouth every other day.     Historical Provider, MD   Physical Exam: Filed Vitals:   05/26/15 1717 05/26/15 1753  BP: 141/55 134/68  Pulse: 91 85  Temp: 98.2 F (36.8 C) 98.2 F (36.8 C)  TempSrc: Oral Oral  Resp: 18 16  Height: 5\' 1"  (1.549 m)   Weight: 101.152 kg (223 lb)   SpO2: 95% 94%     General:  Chronically ill appearing in no apparent distress, [facility], [place]/AT  Eyes: PERRL, EOMI, no scleral icterus, conjunctiva clear  ENT: moist oropharynx without exudate, TM's benign, dentition fair  Neck: supple, no lymphadenopathy. No bruits or thyromegaly  Cardiovascular: regular rate without MRG; 2+ peripheral pulses, no JVD, 2+ peripheral edema  Respiratory: decreased breath sounds throughout with basilar rales bit no wheezes or rhonchi. No dullness. Respiratory effort is normal.  Abdomen: soft, non tender to palpation, positive bowel sounds, no guarding, no rebound, no organomegaly  Skin: no rashes or lesions  Musculoskeletal: normal bulk and tone, no joint swelling  Psychiatric: normal mood and affect, A&OX3  Neurologic: CN 2-12 grossly intact, Motor strength 5/5 in all 4 groups with normal sensory exam and symmetric DTR's  Labs on Admission:  Basic Metabolic Panel:  Recent Labs Lab 05/26/15 1726  NA 135  K 3.4*  CL 103  CO2 23  GLUCOSE 219*  BUN 26*  CREATININE 1.47*  CALCIUM 9.4   Liver Function Tests: No results for input(s): AST, ALT,  ALKPHOS, BILITOT, PROT, ALBUMIN in the last 168 hours. No results for input(s): LIPASE, AMYLASE in the last 168 hours. No results for input(s): AMMONIA in the last 168 hours. CBC:  Recent Labs Lab 05/26/15 1726  WBC 7.7  HGB 14.4  HCT 43.8  MCV 85.5  PLT 229   Cardiac Enzymes:  Recent Labs Lab 05/26/15 1726  TROPONINI <0.03    BNP (last 3 results)  Recent Labs  08/19/14 0815 05/26/15 1726  BNP 60.0 96.0    ProBNP (last 3 results) No results for input(s): PROBNP in the last 8760 hours.  CBG: No results for input(s): GLUCAP in the last 168 hours.  Radiological Exams on Admission: Dg Chest 2 View  05/26/2015  CLINICAL DATA:  Shortness  of breath worsening for the past 3 days. Known COPD. Intermittent fever. EXAM: CHEST  2 VIEW COMPARISON:  Chest x-rays dated 08/19/2014 and 04/21/2014. Comparison also made to chest CT dated 04/21/2014. FINDINGS: Borderline cardiomegaly is stable. Surgical changes of CABG. Median sternotomy wires appear intact and stable in alignment. Coarse interstitial markings are again seen bilaterally, compatible with the chronic interstitial lung disease and emphysematous change more definitively characterized on previous chest CT. No evidence of superimposed pneumonia. No pleural effusion or pneumothorax seen. Osseous structures about the chest are unremarkable. IMPRESSION: Evidence of chronic interstitial lung disease, as also described on previous exams. No evidence of acute cardiopulmonary abnormality. No evidence of pneumonia. Borderline cardiomegaly is stable. Electronically Signed   By: Bary Richard M.D.   On: 05/26/2015 17:48    EKG: Independently reviewed.  Assessment/Plan Principal Problem:   Chest pain Active Problems:   Chronic diastolic CHF (congestive heart failure) (HCC)   Chronic respiratory failure with hypoxia (HCC)   ILD (interstitial lung disease) (HCC)   VQ scan pending. Assuming this is low prob, will place on telemetry and  follow enzymes. Check echo. Consult Cardiology. Adjust O2 as needed.  Diet: low salt Fluids: NS@50  DVT Prophylaxis: SQ Heparin  Code Status: FULL  Family Communication: none  Disposition Plan: home  Time spent: 45 min

## 2015-05-26 NOTE — ED Notes (Signed)
At pt request assisted her to toilet in room without oxygen; then back to bed; sats 80% on room air without oxygen; replaced at 3L via [place] and sats back up to 96%; pt verbalized understanding need for oxygen to remain in place while up to toilet

## 2015-05-26 NOTE — ED Notes (Signed)
Pt given sandwich tray and something to drink; has not eaten since noon; appreciative; understands still waiting for room on the floor to be cleaned by staff; side rails up x 2 with callbell in reach

## 2015-05-26 NOTE — ED Notes (Addendum)
Patient states last Wednesday she started having a "funny feeling" in her chest and upper back pain. Patient states the pain comes and goes and was trying to wait until her HF clinic opened tomorrow. Patient reports some increased shortness of breat with exertion.  Patient states she used Nitro patch on Thursday. Uses ASA 81mg  daily last dose this morning.  Patient dependent on 3L [place] continuous.  Hx of MI and CABG.

## 2015-05-26 NOTE — ED Provider Notes (Signed)
[facility] Emergency Department Provider Note  ____________________________________________  Time seen: Approximately 6:15 PM  I have reviewed the triage vital signs and the nursing notes.   HISTORY  Chief Complaint Chest Pain    HPI Toni Marshall is a 70 y.o. female the history of CAD status post CABG remotely, CHF, COPD on 3 L nasal cannula at home, CKD, presenting with chest pain and shortness of breath. Patient reports that for the past 4 days she has been having intermittent chest pain. She describes a "nagging" "dull" substernal and right sided chest discomfort that radiates to the right scapula and is worse with deep breaths. She has also had shortness of breath with decreased exercise tolerance. No new lower extremity swelling, cough or cold symptoms, fever or chills. No lightheadedness or syncope, palpitations, nausea vomiting or diarrhea.  Nitro patch "somewhat" alleviated her pain.  She took a full asa this morning.   Past Medical History  Diagnosis Date  . H/O acute respiratory failure   . CHF (congestive heart failure) (HCC)     chronic systolic dysfunction with EF of 45%  . NSTEMI (non-ST elevated myocardial infarction) (HCC)   . CAD (coronary artery disease)   . Hypertension   . Cardiogenic shock (HCC)   . Hyperlipidemia   . Glaucoma   . Rheumatoid arthritis(714.0)   . Diabetes mellitus     Type II  . COPD (chronic obstructive pulmonary disease) (HCC)   . Chronic kidney disease     Patient Active Problem List   Diagnosis Date Noted  . Essential hypertension 03/19/2015  . Chronic obstructive pulmonary disease (COPD) (HCC) 03/19/2015  . Chronic diastolic CHF (congestive heart failure) (HCC) 08/20/2014  . Back pain 04/13/2014  . Orthostatic hypotension 01/09/2014  . Morbid obesity (HCC) 10/06/2013  . CAD (coronary artery disease) of artery bypass graft 12/31/2011  . Diabetes (HCC) 12/31/2011  . Hyperlipidemia 12/31/2011    Past  Surgical History  Procedure Laterality Date  . Cardiac catheterization      x2 [facility]  . Cardiac catheterization      [facility]  . Coronary artery bypass graft  2012  . Coronary artery bypass graft  2002  . Eye surgery      left  . Colonoscopy      Current Outpatient Rx  Name  Route  Sig  Dispense  Refill  . albuterol (PROVENTIL HFA;VENTOLIN HFA) 108 (90 BASE) MCG/ACT inhaler   Inhalation   Inhale 2 puffs into the lungs every 6 (six) hours as needed for wheezing or shortness of breath. Reported on 03/19/2015         . aspirin 81 MG tablet   Oral   Take 81 mg by mouth daily.         Marland Kitchen atorvastatin (LIPITOR) 80 MG tablet      TAKE 1 TABLET (80 MG TOTAL) BY MOUTH DAILY.   90 tablet   3   . Brimonidine Tartrate-Timolol (COMBIGAN OP)   Ophthalmic   Apply to eye 2 (two) times daily.          Marland Kitchen CALCIUM PLUS VITAMIN D3 600-500 MG-UNIT CAPS   Oral   Take 1 tablet by mouth 2 (two) times daily.            Dispense as written.   . carvedilol (COREG) 3.125 MG tablet   Oral   Take 3.125 mg by mouth 2 (two) times daily with a meal.         .  clopidogrel (PLAVIX) 75 MG tablet   Oral   Take 75 mg by mouth daily.         . dorzolamide (TRUSOPT) 2 % ophthalmic solution   Left Eye   Place 1 drop into the left eye 2 (two) times daily.         . folic acid (FOLVITE) 400 MCG tablet   Oral   Take 400 mcg by mouth daily.         . furosemide (LASIX) 20 MG tablet   Oral   Take 20 mg by mouth every other day.          Marland Kitchen glipiZIDE (GLUCOTROL XL) 2.5 MG 24 hr tablet   Oral   Take 2.5 mg by mouth daily with breakfast.         . isosorbide mononitrate (IMDUR) 30 MG 24 hr tablet      TAKE 1 TABLET BY MOUTH EVERY DAY   90 tablet   3   . lisinopril (PRINIVIL,ZESTRIL) 5 MG tablet   Oral   Take 1 tablet (5 mg total) by mouth daily.   90 tablet   3   . magnesium oxide (MAG-OX) 400 MG tablet   Oral   Take 400 mg by mouth 2 (two) times daily.          . meclizine  (ANTIVERT) 25 MG tablet   Oral   Take 25 mg by mouth 3 (three) times daily as needed for dizziness.         . naproxen (NAPROSYN) 500 MG tablet   Oral   Take 500 mg by mouth 2 (two) times daily with a meal.         . potassium chloride SA (K-DUR,KLOR-CON) 20 MEQ tablet   Oral   Take 10 mEq by mouth every other day.            Allergies Vancomycin  Family History  Problem Relation Age of Onset  . Heart attack Mother   . Stroke Sister   . Hypertension Father   . Diabetes Father     Social History Social History  Substance Use Topics  . Smoking status: Former Smoker -- 0.50 packs/day for 15 years    Types: Cigarettes  . Smokeless tobacco: Never Used  . Alcohol Use: No    Review of Systems Constitutional: No fever/chills. No lightheadedness or syncope. Eyes: No visual changes. ENT: No sore throat. No congestion or rhinorrhea. Cardiovascular: Positive chest pain. Denies palpitations. Respiratory: Positive shortness of breath.  No cough. Gastrointestinal: No abdominal pain.  No nausea, no vomiting.  No diarrhea.  No constipation. Genitourinary: Negative for dysuria. Musculoskeletal: Negative for back pain. No new lower extremity swelling. No calf tenderness. Skin: Negative for rash. Neurological: Negative for headaches. No focal numbness, tingling or weakness.   10-point ROS otherwise negative.  ____________________________________________   PHYSICAL EXAM:  VITAL SIGNS: ED Triage Vitals  Enc Vitals Group     BP 05/26/15 1717 141/55 mmHg     Pulse Rate 05/26/15 1717 91     Resp 05/26/15 1717 18     Temp 05/26/15 1717 98.2 F (36.8 C)     Temp Source 05/26/15 1717 Oral     SpO2 05/26/15 1717 95 %     Weight 05/26/15 1717 223 lb (101.152 kg)     Height 05/26/15 1717 5\' 1"  (1.549 m)     Head Cir --      Peak Flow --  Pain Score 05/26/15 1717 6     Pain Loc --      Pain Edu? --      Excl. in GC? --     Constitutional: Alert and oriented. On a  clean ill-appearing  in no acute distress. Answers questions appropriately. Eyes: Conjunctivae are normal.  EOMI. No scleral icterus. Head: Atraumatic. Nose: No congestion/rhinnorhea. Mouth/Throat: Mucous membranes are moist.  Neck: No stridor.  Supple.  No JVD. Cardiovascular: Normal rate, regular rhythm. No murmurs, rubs or gallops.  Respiratory: Normal respiratory effort.  No accessory muscle use or retractions. Lungs CTAB.  No wheezes, rales or ronchi. Gastrointestinal: Soft, nontender and nondistended.  No guarding or rebound.  No peritoneal signs. Musculoskeletal: No LE edema. No ttp in the calves or palpable cords.  Negative Homan's sign. Neurologic:  A&Ox3.  Speech is clear.  Face and smile are symmetric.  EOMI.  Moves all extremities well. Skin:  Skin is warm, dry and intact. No rash noted. Psychiatric: Mood and affect are normal. Speech and behavior are normal.  Normal judgement.  ____________________________________________   LABS (all labs ordered are listed, but only abnormal results are displayed)  Labs Reviewed  BASIC METABOLIC PANEL - Abnormal; Notable for the following:    Potassium 3.4 (*)    Glucose, Bld 219 (*)    BUN 26 (*)    Creatinine, Ser 1.47 (*)    GFR calc non Af Amer 35 (*)    GFR calc Af Amer 41 (*)    All other components within normal limits  CBC - Abnormal; Notable for the following:    RDW 15.2 (*)    All other components within normal limits  TROPONIN I  BRAIN NATRIURETIC PEPTIDE   ____________________________________________  EKG  ED ECG REPORT I, Rockne Menghini, the attending physician, personally viewed and interpreted this ECG.   Date: 05/26/2015  EKG Time: 1710  Rate: 92  Rhythm: normal sinus rhythm  Axis: Normal axis  Intervals:none  ST&T Change: Nonspecific T-wave inversion in V1.  ____________________________________________  RADIOLOGY  Dg Chest 2 View  05/26/2015  CLINICAL DATA:  Shortness of breath worsening for  the past 3 days. Known COPD. Intermittent fever. EXAM: CHEST  2 VIEW COMPARISON:  Chest x-rays dated 08/19/2014 and 04/21/2014. Comparison also made to chest CT dated 04/21/2014. FINDINGS: Borderline cardiomegaly is stable. Surgical changes of CABG. Median sternotomy wires appear intact and stable in alignment. Coarse interstitial markings are again seen bilaterally, compatible with the chronic interstitial lung disease and emphysematous change more definitively characterized on previous chest CT. No evidence of superimposed pneumonia. No pleural effusion or pneumothorax seen. Osseous structures about the chest are unremarkable. IMPRESSION: Evidence of chronic interstitial lung disease, as also described on previous exams. No evidence of acute cardiopulmonary abnormality. No evidence of pneumonia. Borderline cardiomegaly is stable. Electronically Signed   By: Bary Richard M.D.   On: 05/26/2015 17:48    ____________________________________________   PROCEDURES  Procedure(s) performed: None  Critical Care performed: No ____________________________________________   INITIAL IMPRESSION / ASSESSMENT AND PLAN / ED COURSE  Pertinent labs & imaging results that were available during my care of the patient were reviewed by me and considered in my medical decision making (see chart for details).  70 y.o. female with significant coronary artery disease, COPD, presenting with right-sided pleuritic chest pain and associated shortness of breath with decreased exercise tolerance has been going on for several days. It is possible that the patient has atypical ACS or an atypical  MI, I am also concerned about underlying pulmonary causes, including PE. We will plan to admit the patient for continued cardiac evaluation, as well as nuclear scan to evaluate for PE as she has renal insufficiency and is not a candidate for CTA.  ____________________________________________  FINAL CLINICAL IMPRESSION(S) / ED  DIAGNOSES  Final diagnoses:  Chest pain, unspecified chest pain type  Shortness of breath  Decreased exercise tolerance      NEW MEDICATIONS STARTED DURING THIS VISIT:  New Prescriptions   No medications on file     Rockne Menghini, MD 05/26/15 1821

## 2015-05-26 NOTE — ED Notes (Signed)
Pt c/o chest pain since Thursday - Pt states it feels like "I ate something and it went down the wrong way" - Pain radiates into center of shoulder blades and under right breast - Pt reports shortness of breath - Pt denies nausea and vomiting

## 2015-05-26 NOTE — ED Notes (Signed)
ECHO not complete; completed in error

## 2015-05-27 ENCOUNTER — Observation Stay (HOSPITAL_BASED_OUTPATIENT_CLINIC_OR_DEPARTMENT_OTHER)
Admit: 2015-05-27 | Discharge: 2015-05-27 | Disposition: A | Discharge status: Home / Self Care | Payer: Medicare PPO | Attending: Internal Medicine | Admitting: Internal Medicine

## 2015-05-27 ENCOUNTER — Encounter: Payer: Self-pay | Admitting: Radiology

## 2015-05-27 DIAGNOSIS — R079 Chest pain, unspecified: Secondary | ICD-10-CM

## 2015-05-27 DIAGNOSIS — R071 Chest pain on breathing: Secondary | ICD-10-CM

## 2015-05-27 LAB — GLUCOSE, CAPILLARY
GLUCOSE-CAPILLARY: 103 mg/dL — AB (ref 65–99)
GLUCOSE-CAPILLARY: 105 mg/dL — AB (ref 65–99)
Glucose-Capillary: 124 mg/dL — ABNORMAL HIGH (ref 65–99)
Glucose-Capillary: 227 mg/dL — ABNORMAL HIGH (ref 65–99)
Glucose-Capillary: 114 mg/dL — ABNORMAL HIGH (ref 65–99)

## 2015-05-27 LAB — COMPREHENSIVE METABOLIC PANEL
ALBUMIN: 3 g/dL — AB (ref 3.5–5.0)
ALT: 11 U/L — AB (ref 14–54)
AST: 17 U/L (ref 15–41)
Alkaline Phosphatase: 71 U/L (ref 38–126)
Anion gap: 8 (ref 5–15)
BUN: 21 mg/dL — ABNORMAL HIGH (ref 6–20)
CHLORIDE: 108 mmol/L (ref 101–111)
CO2: 19 mmol/L — AB (ref 22–32)
CREATININE: 1.07 mg/dL — AB (ref 0.44–1.00)
Calcium: 8.6 mg/dL — ABNORMAL LOW (ref 8.9–10.3)
GFR calc Af Amer: >60 mL/min (ref >60)
GFR calc non Af Amer: 52 mL/min — ABNORMAL LOW (ref >60)
Glucose, Bld: 139 mg/dL — ABNORMAL HIGH (ref 65–99)
Potassium: 3.8 mmol/L (ref 3.5–5.1)
SODIUM: 135 mmol/L (ref 135–145)
Total Bilirubin: 0.6 mg/dL (ref 0.3–1.2)
Total Protein: 7.5 g/dL (ref 6.5–8.1)

## 2015-05-27 LAB — TROPONIN I: Troponin I: <0.03 ng/mL (ref <0.031)

## 2015-05-27 LAB — CBC
HCT: 41.6 % (ref 35.0–47.0)
Hemoglobin: 13.5 g/dL (ref 12.0–16.0)
MCH: 27.4 pg (ref 26.0–34.0)
MCHC: 32.4 g/dL (ref 32.0–36.0)
MCV: 84.5 fL (ref 80.0–100.0)
PLATELETS: 217 10*3/uL (ref 150–440)
RBC: 4.93 MIL/uL (ref 3.80–5.20)
RDW: 15.1 % — AB (ref 11.5–14.5)
WBC: 8.1 10*3/uL (ref 3.6–11.0)

## 2015-05-27 MED ORDER — TECHNETIUM TO 99M ALBUMIN AGGREGATED
4.1100 | Freq: Once | INTRAVENOUS | Status: AC | PRN
  Administered 2015-05-27: 4.11 via INTRAVENOUS

## 2015-05-27 MED ORDER — TECHNETIUM TC 99M DIETHYLENETRIAME-PENTAACETIC ACID
32.9900 | Freq: Once | INTRAVENOUS | Status: AC | PRN
  Administered 2015-05-27: 32.99 via RESPIRATORY_TRACT

## 2015-05-27 MED ORDER — INSULIN ASPART 100 UNIT/ML ~~LOC~~ SOLN: 0.0000 [IU] | Freq: Three times a day (TID) | SUBCUTANEOUS | Status: DC

## 2015-05-27 NOTE — Progress Notes (Signed)
Back from VQ Scan

## 2015-05-27 NOTE — Progress Notes (Signed)
Patient admitted to room 241 with the diagnosis of chest pain. Alert and oriented x 4. Denied any chest pain at this time. No respiratory distress noted. Patient oriented to staff, call bell/ascom. Set up pass word, Tele box verified with [place]. Bed in the lowest position. Will continue to monitor.

## 2015-05-27 NOTE — Consult Note (Signed)
Cardiology Consultation Note  Patient ID: Toni Marshall, MRN: [id number], DOB/AGE: July 28, 1945 70 y.o. Admit date: 05/26/2015   Date of Consult: 05/27/2015 Primary Physician: Emogene Morgan, MD Primary Cardiologist: Dr. Mariah Milling, MD  Chief Complaint: Back pain and reflux Reason for Consult: Chest pain  HPI: 70 y.o. female with h/o CAD s/p CABG in 2002 with redo CABG in 2012 at [facility], chronic respiratory failure with hypoxia on home oxygen at 3 L via nasal cannula 2/2 COPD, diastolic dysfunction, morbid obesity, DM2, HTN, and HLD who presented to [facility] with right-sided posterior back pain that is pleuritic in etiology coupled with reflux. She has ruled out. VQ scan and echo are pending. Cardiology is consulted for further input.   Patient's anginal equivalent possibly includes a component of indigestion Last cardiac catheterization on 10/30/2011 showed severe 3 vessel CAD with patent vein graft to the RCA, occluded vein graft to the OM and LIMA to the LAD. Attempted LAD PCI which was unsuccessful. Left main is moderately calcified, 99% mid LAD followed by a 90% lesion, 50% distal LAD disease, diagonal #2 had 90% ostial disease, 90% mid circumflex, proximal 60% RCA disease, LIMA graft with 100% stenosis at the graft ostium, saphenous vein graft to the OM with 100% ostial graft disease, vein graft to the distal RCA showing 30% proximal graft disease, 40% mid graft disease, ejection fraction 40%. Echo 2014 showed EF 50-55%, normal RV size and and systolic function, normal RVSP. Echo from 2016 showed improved EF to 60-65%, diastolic dysfunction, and normal RVSP. She has a long history of smoking and working in a [employer]. She has subsequently been placed on oxygen at 3 L via nasal cannula since 03/2014 hospital admission for acute respiratory failure. During that admission she underwent Lexiscan Myoview that showed poor images, large region of severe scar in the anterior and apical territory. GI uptake was noted.  EF 50%. Overall, moderate risk scan. Last weight in the office was 225. Her weight at home has been running around 222 pounds. She has not been noting any LEE or increased orthopnea. She has stable 3-pillow orthopnea.   She reports being at [facility] on Wednesday, 05/22/2015, getting 2 slices of pizza, getting choked on this, subsequently drinking soda, then developing symptoms c/w reflux and right-sided pleuritic chest pain. No exertional symptoms. No diaphoresis. No nausea or vomiting. No presyncope, syncope, or palpitations. She continues to note pleuritic symptoms at this time, though improved. No lower extremity swelling, erythema, or warmth. No recent sedentary periods.   Upon the patient's arrival to [facility] they were found to have troponin negative x 3. ECG showed NSR, 92 bpm, low voltage, QRS, nonspecific lateral T wave changes, CXR showed evidence of previously noted chronic interstitial lung disease, o/w no acute cardiopulmonary disease. VQ scan is pending this morning.   Past Medical History  Diagnosis Date  . H/O acute respiratory failure   . CHF (congestive heart failure) (HCC)     chronic systolic dysfunction with EF of 45%  . NSTEMI (non-ST elevated myocardial infarction) (HCC)   . CAD (coronary artery disease)   . Hypertension   . Cardiogenic shock (HCC)   . Hyperlipidemia   . Glaucoma   . Rheumatoid arthritis(714.0)   . Diabetes mellitus     Type II  . COPD (chronic obstructive pulmonary disease) (HCC)   . Chronic kidney disease       Most Recent Cardiac Studies: As above   Surgical History:  Past Surgical History  Procedure  Laterality Date  . Cardiac catheterization      x2 [facility]  . Cardiac catheterization      [facility]  . Coronary artery bypass graft  2012  . Coronary artery bypass graft  2002  . Eye surgery      left  . Colonoscopy       Home Meds: Prior to Admission medications   Medication Sig Start Date End Date Taking? Authorizing Provider  albuterol (PROVENTIL  HFA;VENTOLIN HFA) 108 (90 BASE) MCG/ACT inhaler Inhale 2 puffs into the lungs every 6 (six) hours as needed for wheezing or shortness of breath. Reported on 03/19/2015   Yes Historical Provider, MD  aspirin EC 81 MG tablet Take 81 mg by mouth daily.   Yes Historical Provider, MD  atorvastatin (LIPITOR) 80 MG tablet TAKE 1 TABLET (80 MG TOTAL) BY MOUTH DAILY. 12/03/14  Yes Antonieta Iba, MD  CALCIUM PLUS VITAMIN D3 600-500 MG-UNIT CAPS Take 1 tablet by mouth 2 (two) times daily.  04/10/14  Yes Historical Provider, MD  carvedilol (COREG) 3.125 MG tablet Take 3.125 mg by mouth 2 (two) times daily with a meal.   Yes Historical Provider, MD  clopidogrel (PLAVIX) 75 MG tablet Take 75 mg by mouth daily.   Yes Historical Provider, MD  COMBIGAN 0.2-0.5 % ophthalmic solution Place 1 drop into the left eye 2 (two) times daily. 03/28/15  Yes Historical Provider, MD  dorzolamide (TRUSOPT) 2 % ophthalmic solution Place 1 drop into the left eye 2 (two) times daily.   Yes Historical Provider, MD  folic acid (FOLVITE) 400 MCG tablet Take 400 mcg by mouth daily.   Yes Historical Provider, MD  furosemide (LASIX) 20 MG tablet Take 20 mg by mouth every other day.    Yes Historical Provider, MD  glipiZIDE (GLUCOTROL XL) 2.5 MG 24 hr tablet Take 2.5 mg by mouth daily with breakfast.   Yes Historical Provider, MD  isosorbide mononitrate (IMDUR) 30 MG 24 hr tablet TAKE 1 TABLET BY MOUTH EVERY DAY 10/26/14  Yes Antonieta Iba, MD  KLOR-CON 10 10 MEQ tablet Take 10 mEq by mouth See admin instructions. Take 1 tablet by mouth 2 times a day every other day with Furosemide. 04/25/15  Yes Historical Provider, MD  lisinopril (PRINIVIL,ZESTRIL) 5 MG tablet Take 1 tablet (5 mg total) by mouth daily. 07/26/14  Yes Delma Freeze, FNP  magnesium oxide (MAG-OX) 400 MG tablet Take 400 mg by mouth 2 (two) times daily.    Yes Historical Provider, MD  OXYGEN Inhale 3 L/min into the lungs continuous.   Yes Historical Provider, MD    Inpatient  Medications:  . aspirin EC  81 mg Oral Daily  . atorvastatin  80 mg Oral q1800  . brimonidine  1 drop Left Eye BID  . carvedilol  3.125 mg Oral BID WC  . clopidogrel  75 mg Oral Daily  . docusate sodium  100 mg Oral BID  . dorzolamide  1 drop Left Eye BID  . folic acid  500 mcg Oral Daily  . furosemide  20 mg Oral QODAY  . glipiZIDE  2.5 mg Oral Q breakfast  . heparin  5,000 Units Subcutaneous 3 times per day  . isosorbide mononitrate  30 mg Oral Daily  . lisinopril  5 mg Oral Daily  . magnesium oxide  400 mg Oral BID  . potassium chloride  10 mEq Oral 2 times per day every other day  . sodium chloride flush  3 mL Intravenous  Q12H  . timolol  1 drop Left Eye BID   . 0.9 % NaCl with KCl 20 mEq / L 1 mL (05/26/15 2201)    Allergies:  Allergies  Allergen Reactions  . Vancomycin     Hypotension & hypoxia     Social History   Social History  . Marital Status: Single    Spouse Name: N/A  . Number of Children: N/A  . Years of Education: N/A   Occupational History  . Not on file.   Social History Main Topics  . Smoking status: Former Smoker -- 0.50 packs/day for 15 years    Types: Cigarettes  . Smokeless tobacco: Never Used  . Alcohol Use: No  . Drug Use: No  . Sexual Activity: Not on file   Other Topics Concern  . Not on file   Social History Narrative     Family History  Problem Relation Age of Onset  . Heart attack Mother   . Stroke Sister   . Hypertension Father   . Diabetes Father      Review of Systems: Review of Systems  Constitutional: Positive for weight loss and malaise/fatigue. Negative for fever, chills and diaphoresis.  HENT: Negative for congestion.   Eyes: Negative for discharge and redness.  Respiratory: Positive for shortness of breath. Negative for cough, hemoptysis, sputum production and wheezing.   Cardiovascular: Positive for chest pain. Negative for palpitations, orthopnea, claudication, leg swelling and PND.  Gastrointestinal:  Positive for heartburn. Negative for nausea, vomiting and abdominal pain.  Musculoskeletal: Negative for myalgias and falls.  Skin: Negative for rash.  Neurological: Positive for weakness. Negative for dizziness, tingling, tremors, sensory change, speech change, focal weakness and loss of consciousness.  Endo/Heme/Allergies: Does not bruise/bleed easily.  Psychiatric/Behavioral: Negative for substance abuse. The patient is not nervous/anxious.   All other systems reviewed and are negative.   Labs:  Recent Labs  05/26/15 1726 05/26/15 2257 05/27/15 0527  TROPONINI <0.03 <0.03 <0.03   Lab Results  Component Value Date   WBC 8.1 05/27/2015   HGB 13.5 05/27/2015   HCT 41.6 05/27/2015   MCV 84.5 05/27/2015   PLT 217 05/27/2015    Recent Labs Lab 05/27/15 0527  NA 135  K 3.8  CL 108  CO2 19*  BUN 21*  CREATININE 1.07*  CALCIUM 8.6*  PROT 7.5  BILITOT 0.6  ALKPHOS 71  ALT 11*  AST 17  GLUCOSE 139*   Lab Results  Component Value Date   CHOL 110 09/17/2012   HDL 47 09/17/2012   LDLCALC 49 09/17/2012   TRIG 69 09/17/2012   No results found for: DDIMER  Radiology/Studies:  Dg Chest 2 View  05/26/2015  CLINICAL DATA:  Shortness of breath worsening for the past 3 days. Known COPD. Intermittent fever. EXAM: CHEST  2 VIEW COMPARISON:  Chest x-rays dated 08/19/2014 and 04/21/2014. Comparison also made to chest CT dated 04/21/2014. FINDINGS: Borderline cardiomegaly is stable. Surgical changes of CABG. Median sternotomy wires appear intact and stable in alignment. Coarse interstitial markings are again seen bilaterally, compatible with the chronic interstitial lung disease and emphysematous change more definitively characterized on previous chest CT. No evidence of superimposed pneumonia. No pleural effusion or pneumothorax seen. Osseous structures about the chest are unremarkable. IMPRESSION: Evidence of chronic interstitial lung disease, as also described on previous exams. No  evidence of acute cardiopulmonary abnormality. No evidence of pneumonia. Borderline cardiomegaly is stable. Electronically Signed   By: Bary Richard M.D.   On:  05/26/2015 17:48    EKG: NSR, 92 bpm, low voltage, QRS, nonspecific lateral T wave changes  Weights: Filed Weights   05/26/15 1717 05/26/15 2231 05/27/15 0410  Weight: 223 lb (101.152 kg) 218 lb 9.6 oz (99.156 kg) 218 lb 9.6 oz (99.156 kg)     Physical Exam: Blood pressure 129/71, pulse 76, temperature 97.5 F (36.4 C), temperature source Oral, resp. rate 17, height 5\' 1"  (1.549 m), weight 218 lb 9.6 oz (99.156 kg), SpO2 95 %. Body mass index is 41.33 kg/(m^2). General: Well developed, well nourished, in no acute distress. Head: Normocephalic, atraumatic, sclera non-icteric, no xanthomas, nares are without discharge.  Neck: Negative for carotid bruits. JVD not elevated. Lungs: Clear bilaterally to auscultation without wheezes, rales, or rhonchi. Breathing is unlabored. On 2.5 L via nasal cannula.  Heart: RRR with S1 S2. No murmurs, rubs, or gallops appreciated. Abdomen: Obese, soft, non-tender, non-distended with normoactive bowel sounds. No hepatomegaly. No rebound/guarding. No obvious abdominal masses. Msk:  Strength and tone appear normal for age. Extremities: No clubbing or cyanosis. No edema.  Distal pedal pulses are 2+ and equal bilaterally. Neuro: Alert and oriented X 3. No facial asymmetry. No focal deficit. Moves all extremities spontaneously. Psych:  Responds to questions appropriately with a normal affect.    Assessment and Plan:   1. Atypical, pleuritic chest pain: -She has eaten breakfast this morning at 8-8:30 AM -She is for VQ scan today to evaluate for high risk PE, this precludes the ability for nuclear stress testing for 48 hours -Versus IM could order CTA chest PE protocol as her renal function has improved and given her COPD adn body habitus this may be the better study -CTA chest PE protocol would also  allow for nuclear stress testing too -Can evaluate for outpatient nuclear stress testing -Check echo to evaluate LV function and right size  2. CAD s/p CABG with redo CABG as above: -As above -Aspirin 81 mg, Plavix 75 mg -Coreg 3.125 mg bid, Lasix 20 mg daily, lisinopril 5 mg, Imdur 30 mg  3. Diastolic dysfunction: -She does note appear volume overloaded at this time -Add BNP to labs -Echo is pending -Continue current medications  4. Chronic respiratory failure: -No acute flare at this time, currently on 2.5 L -Continue oxygen  5. Possible reflux: -? Anginal equivalent  -Consider empiric GI treatment as well  5. HTN: -Improved -Continue current medications  6. HLD: -Lipitor 80 mg  7. Morbid obesity: -Weight loss advised    , PA-C Pager: [phone] 05/27/2015, 9:11 AM

## 2015-05-27 NOTE — Discharge Instructions (Signed)
[facility] appointment on June 17, 2015 at 10:30am with Clarisa Kindred, FNP. Please call [phone] to reschedule.

## 2015-05-27 NOTE — Progress Notes (Addendum)
Inpatient Diabetes Program Recommendations  AACE/ADA: New Consensus Statement on Inpatient Glycemic Control (2015)  Target Ranges:  Prepandial:   less than 140 mg/dL      Peak postprandial:   less than 180 mg/dL (1-2 hours)      Critically ill patients:  140 - 180 mg/dL   Review of Glycemic Control  Results for [name], [name] (MRN [id number]) as of 05/27/2015 12:02  Ref. Range 05/27/2015 07:38 05/27/2015 11:05  Glucose-Capillary Latest Ref Range: 65-99 mg/dL 885 (H) 027 (H)    Diabetes history: Type 2 Outpatient Diabetes medications: Glipizide 2.5mg /day Current orders for Inpatient glycemic control: Glipizide 2.5mg /day  Inpatient Diabetes Program Recommendations:   Consider d/c Glipizide while patient is inpatient (high risk for hypoglycemia) .           Consider ordering CBG tid and hs and Novolog moderate correction scale tid, Novolog 0-5 units qhs.          Consider ordering an A1C to determine blood sugar control over the last 2-3 months.        Consider changing diet to carb modified, heart healthy, 2 gram sodium.    Susette Racer, RN, BA, MHA, CDE Diabetes Coordinator Inpatient Diabetes Program  [phone] (Team Pager) [phone] [facility] Office) 05/27/2015 12:04 PM

## 2015-05-27 NOTE — Progress Notes (Signed)
Patient resting quietly this admission. Independent in room - instructed to call for assistance when needed. No complaints.

## 2015-05-27 NOTE — Progress Notes (Signed)
*  PRELIMINARY RESULTS* Echocardiogram 2D Echocardiogram has been performed.  Toni Marshall 05/27/2015, 8:53 PM

## 2015-05-27 NOTE — Progress Notes (Signed)
To nuclear medicine via bed 

## 2015-05-28 LAB — ECHOCARDIOGRAM COMPLETE
HEIGHTINCHES: 61 in
Weight: 3497.59 oz

## 2015-05-28 LAB — GLUCOSE, CAPILLARY: GLUCOSE-CAPILLARY: 103 mg/dL — AB (ref 65–99)

## 2015-05-28 MED ORDER — PANTOPRAZOLE SODIUM 40 MG PO TBEC: 40.0000 mg | DELAYED_RELEASE_TABLET | Freq: Two times a day (BID) | ORAL | Status: DC

## 2015-05-28 MED ORDER — PANTOPRAZOLE SODIUM 40 MG PO TBEC
40.0000 mg | DELAYED_RELEASE_TABLET | Freq: Two times a day (BID) | ORAL | Status: DC
  Administered 2015-05-28: 40 mg via ORAL
  Filled 2015-05-28: qty 1

## 2015-05-28 NOTE — Care Management (Addendum)
Paced in observation with chest pain.  Troponins are negative.  Cardiology has consulted 4/3 and no further cardiac intervention .  Ruled out for pulmonary embolus.  would anticipate discharge today.  No discharge needs identified

## 2015-05-28 NOTE — Progress Notes (Signed)
Removed telemetry and removed PIV.  Patient on [facility], which is her baseline.  Rx called into [facility].  Patient able to repeat back CHF information and will go to HF clinic.  No questions at this time.  Patient to be escorted out of hospital via wheelchair by nursing staff.

## 2015-05-28 NOTE — Discharge Summary (Signed)
[facility] [place] at [facility]   PATIENT NAME: Toni Marshall    MR#:  [id number]  DATE OF BIRTH:  [birth year]  DATE OF ADMISSION:  05/26/2015 ADMITTING PHYSICIAN: Marguarite Arbour, MD  DATE OF DISCHARGE: 05/28/2015  PRIMARY CARE PHYSICIAN: Emogene Morgan, MD    ADMISSION DIAGNOSIS:  Shortness of breath [R06.02] Pleuritic chest pain [R07.81] Decreased exercise tolerance [R68.89] Chest pain, unspecified chest pain type [R07.9]  DISCHARGE DIAGNOSIS:  Principal Problem:   Chest pain Active Problems:   Chronic diastolic CHF (congestive heart failure) (HCC)   Chronic respiratory failure with hypoxia (HCC)   ILD (interstitial lung disease) (HCC)   Likely GI origin chest pain.  SECONDARY DIAGNOSIS:   Past Medical History  Diagnosis Date  . H/O acute respiratory failure   . CHF (congestive heart failure) (HCC)     chronic systolic dysfunction with EF of 45%  . NSTEMI (non-ST elevated myocardial infarction) (HCC)   . CAD (coronary artery disease)   . Hypertension   . Cardiogenic shock (HCC)   . Hyperlipidemia   . Glaucoma   . Rheumatoid arthritis(714.0)   . Diabetes mellitus     Type II  . COPD (chronic obstructive pulmonary disease) (HCC)   . Chronic kidney disease     HOSPITAL COURSE:   * Chest pain  Likely GI or pleuritic origin.   Pt is already known to have CAD- cardio working to get out pt work ups.  Pain is still there, will add protonix BID.  Cardio may consider work ups as out pt.  Troponin negative stable on tele.  * Ch diastolic CHD * Hx of CAD * ILD  All of that stable, cont home meds.  DISCHARGE CONDITIONS:   Stable.  CONSULTS OBTAINED:  Treatment Team:  Iran Ouch, MD  DRUG ALLERGIES:   Allergies  Allergen Reactions  . Vancomycin     Hypotension & hypoxia     DISCHARGE MEDICATIONS:   Current Discharge Medication List    START taking these medications   Details  pantoprazole (PROTONIX) 40 MG  tablet Take 1 tablet (40 mg total) by mouth 2 (two) times daily. Qty: 60 tablet, Refills: 0      CONTINUE these medications which have NOT CHANGED   Details  albuterol (PROVENTIL HFA;VENTOLIN HFA) 108 (90 BASE) MCG/ACT inhaler Inhale 2 puffs into the lungs every 6 (six) hours as needed for wheezing or shortness of breath. Reported on 03/19/2015    aspirin EC 81 MG tablet Take 81 mg by mouth daily.    atorvastatin (LIPITOR) 80 MG tablet TAKE 1 TABLET (80 MG TOTAL) BY MOUTH DAILY. Qty: 90 tablet, Refills: 3    CALCIUM PLUS VITAMIN D3 600-500 MG-UNIT CAPS Take 1 tablet by mouth 2 (two) times daily.     carvedilol (COREG) 3.125 MG tablet Take 3.125 mg by mouth 2 (two) times daily with a meal.    clopidogrel (PLAVIX) 75 MG tablet Take 75 mg by mouth daily.    COMBIGAN 0.2-0.5 % ophthalmic solution Place 1 drop into the left eye 2 (two) times daily. Refills: 4    dorzolamide (TRUSOPT) 2 % ophthalmic solution Place 1 drop into the left eye 2 (two) times daily.    folic acid (FOLVITE) 400 MCG tablet Take 400 mcg by mouth daily.    furosemide (LASIX) 20 MG tablet Take 20 mg by mouth every other day.     glipiZIDE (GLUCOTROL XL) 2.5 MG 24 hr tablet Take 2.5  mg by mouth daily with breakfast.    isosorbide mononitrate (IMDUR) 30 MG 24 hr tablet TAKE 1 TABLET BY MOUTH EVERY DAY Qty: 90 tablet, Refills: 3    KLOR-CON 10 10 MEQ tablet Take 10 mEq by mouth See admin instructions. Take 1 tablet by mouth 2 times a day every other day with Furosemide. Refills: 5    lisinopril (PRINIVIL,ZESTRIL) 5 MG tablet Take 1 tablet (5 mg total) by mouth daily. Qty: 90 tablet, Refills: 3    magnesium oxide (MAG-OX) 400 MG tablet Take 400 mg by mouth 2 (two) times daily.     OXYGEN Inhale 3 L/min into the lungs continuous.         DISCHARGE INSTRUCTIONS:    Follow with Dr. Mariah Milling on 2 weeks.  If you experience worsening of your admission symptoms, develop shortness of breath, life threatening  emergency, suicidal or homicidal thoughts you must seek medical attention immediately by calling 911 or calling your MD immediately  if symptoms less severe.  You Must read complete instructions/literature along with all the possible adverse reactions/side effects for all the Medicines you take and that have been prescribed to you. Take any new Medicines after you have completely understood and accept all the possible adverse reactions/side effects.   Please note  You were cared for by a hospitalist during your hospital stay. If you have any questions about your discharge medications or the care you received while you were in the hospital after you are discharged, you can call the unit and asked to speak with the hospitalist on call if the hospitalist that took care of you is not available. Once you are discharged, your primary care physician will handle any further medical issues. Please note that NO REFILLS for any discharge medications will be authorized once you are discharged, as it is imperative that you return to your primary care physician (or establish a relationship with a primary care physician if you do not have one) for your aftercare needs so that they can reassess your need for medications and monitor your lab values.    Today   CHIEF COMPLAINT:   Chief Complaint  Patient presents with  . Chest Pain    HISTORY OF PRESENT ILLNESS:  Toni Marshall  is a 70 y.o. female with a known history of ASCVD s/p CABG, chronic diastolic CHF, interstitial lung disease, and chronic respiratory failure on 3L O2 at home now with 4 day hx of intermittent CP and SOB. Sx's worse with movement. No worsening of her chronic LE edema or O2 requirements. Initial troponin OK. She is now admitted. Currently pain free. The pain is sometimes pleuritic in nature with no radiation or N/V. Does get SOB with CP. No fever or cough   VITAL SIGNS:  Blood pressure 120/58, pulse 74, temperature 98.1 F (36.7 C),  temperature source Oral, resp. rate 20, height 5\' 1"  (1.549 m), weight 101.424 kg (223 lb 9.6 oz), SpO2 94 %.  I/O:   Intake/Output Summary (Last 24 hours) at 05/28/15 1022 Last data filed at 05/28/15 0957  Gross per 24 hour  Intake    600 ml  Output   1150 ml  Net   -550 ml    PHYSICAL EXAMINATION:   GENERAL: 69 y.o.-year-old patient lying in the bed with no acute distress.  EYES: Pupils equal, round, reactive to light and accommodation. No scleral icterus. Extraocular muscles intact.  HEENT: Head atraumatic, normocephalic. Oropharynx and nasopharynx clear.  NECK: Supple, no jugular venous  distention. No thyroid enlargement, no tenderness.  LUNGS: Normal breath sounds bilaterally, no wheezing, rales,rhonchi or crepitation. No use of accessory muscles of respiration.  CARDIOVASCULAR: S1, S2 normal. No murmurs, rubs, or gallops.  ABDOMEN: Soft, nontender, nondistended. Bowel sounds present. No organomegaly or mass.  EXTREMITIES: No pedal edema, cyanosis, or clubbing.  NEUROLOGIC: Cranial nerves II through XII are intact. Muscle strength 5/5 in all extremities. Sensation intact. Gait not checked.  PSYCHIATRIC: The patient is alert and oriented x 3.  SKIN: No obvious rash, lesion, or ulcer.   DATA REVIEW:   CBC  Recent Labs Lab 05/27/15 0527  WBC 8.1  HGB 13.5  HCT 41.6  PLT 217    Chemistries   Recent Labs Lab 05/27/15 0527  NA 135  K 3.8  CL 108  CO2 19*  GLUCOSE 139*  BUN 21*  CREATININE 1.07*  CALCIUM 8.6*  AST 17  ALT 11*  ALKPHOS 71  BILITOT 0.6    Cardiac Enzymes  Recent Labs Lab 05/27/15 1123  TROPONINI <0.03    Microbiology Results  Results for orders placed or performed in visit on 01/28/12  Urine culture     Status: None   Collection Time: 01/28/12  7:40 AM  Result Value Ref Range Status   Micro Text Report   Final       SOURCE: CLEAN CATCH URINE    ORGANISM 1                >100,000 CFU/ML ENTEROBACTER AEROGENES    ANTIBIOTIC                    ORG#1     CEFAZOLIN                     R         CEFTRIAXONE                   S         CIPROFLOXACIN                 S         GENTAMICIN                    S         IMIPENEM                      S         LEVOFLOXACIN                  S         NITROFURANTOIN                I         CEFOXITIN                     R         TRIMETHOPRIM/SULFAMETHOXAZOLE S             RADIOLOGY:  Dg Chest 2 View  05/26/2015  CLINICAL DATA:  Shortness of breath worsening for the past 3 days. Known COPD. Intermittent fever. EXAM: CHEST  2 VIEW COMPARISON:  Chest x-rays dated 08/19/2014 and 04/21/2014. Comparison also made to chest CT dated 04/21/2014. FINDINGS: Borderline cardiomegaly is stable. Surgical changes of CABG. Median sternotomy wires appear intact and stable in alignment. Coarse interstitial markings are again seen bilaterally, compatible with  the chronic interstitial lung disease and emphysematous change more definitively characterized on previous chest CT. No evidence of superimposed pneumonia. No pleural effusion or pneumothorax seen. Osseous structures about the chest are unremarkable. IMPRESSION: Evidence of chronic interstitial lung disease, as also described on previous exams. No evidence of acute cardiopulmonary abnormality. No evidence of pneumonia. Borderline cardiomegaly is stable. Electronically Signed   By: Bary Richard M.D.   On: 05/26/2015 17:48   Nm Pulmonary Perf And Vent  05/27/2015  CLINICAL DATA:  Pleuritic chest pain and shortness of breath for 5 days, history COPD, former smoker, CHF, coronary disease post NSTEMI EXAM: NUCLEAR MEDICINE VENTILATION - PERFUSION LUNG SCAN TECHNIQUE: Ventilation images were obtained in multiple projections using inhaled aerosol Tc-49m DTPA. Perfusion images were obtained in multiple projections after intravenous injection of Tc-66m MAA. RADIOPHARMACEUTICALS:  32.99 mCi Technetium-32m DTPA aerosol inhalation and 4.11 mCi  Technetium-67m MAA IV COMPARISON:  None FINDINGS: Ventilation: Single tiny subsegmental ventilation defect RIGHT upper lobe. Otherwise normal exam. Swallowed aerosol within stomach. Perfusion: Normal exam Chest radiograph:  Chronic interstitial lung disease.  Post CABG. IMPRESSION: Normal perfusion lung scan. Electronically Signed   By: Ulyses Southward M.D.   On: 05/27/2015 13:40    EKG:   Orders placed or performed during the hospital encounter of 05/26/15  . EKG 12-Lead  . EKG 12-Lead  . ED EKG within 10 minutes  . ED EKG within 10 minutes      Management plans discussed with the patient, family and they are in agreement.  CODE STATUS: Full.    Code Status Orders        Start     Ordered   05/26/15 2230  Full code   Continuous     05/26/15 2229    Code Status History    Date Active Date Inactive Code Status Order ID Comments User Context   This patient has a current code status but no historical code status.    Advance Directive Documentation        Most Recent Value   Type of Advance Directive  Healthcare Power of Attorney   Pre-existing out of facility DNR order (yellow form or pink MOST form)     "MOST" Form in Place?        TOTAL TIME TAKING CARE OF THIS PATIENT: 35 minutes.    Altamese Dilling M.D on 05/28/2015 at 10:22 AM  Between 7am to 6pm - Pager - [phone]  After 6pm go to [email or web address] - password [email or web address]  [facility]  Office  [phone]  CC: Primary care physician; Emogene Morgan, MD   Note: This dictation was prepared with Dragon dictation along with smaller phrase technology. Any transcriptional errors that result from this process are unintentional.

## 2015-05-28 NOTE — Progress Notes (Signed)
[facility] [place] at [facility]   PATIENT NAME: Toni Marshall    MR#:  [id number]  DATE OF BIRTH:  14-Sep-1945  SUBJECTIVE:  CHIEF COMPLAINT:   Chief Complaint  Patient presents with  . Chest Pain    Pain in like pleuritic, or GI origin, troponin negative.  REVIEW OF SYSTEMS:  CONSTITUTIONAL: No fever, fatigue or weakness.  EYES: No blurred or double vision.  EARS, NOSE, AND THROAT: No tinnitus or ear pain.  RESPIRATORY: No cough, shortness of breath, wheezing or hemoptysis.  CARDIOVASCULAR: No chest pain, orthopnea, edema.  GASTROINTESTINAL: No nausea, vomiting, diarrhea or abdominal pain.  GENITOURINARY: No dysuria, hematuria.  ENDOCRINE: No polyuria, nocturia,  HEMATOLOGY: No anemia, easy bruising or bleeding SKIN: No rash or lesion. MUSCULOSKELETAL: No joint pain or arthritis.   NEUROLOGIC: No tingling, numbness, weakness.  PSYCHIATRY: No anxiety or depression.   ROS  DRUG ALLERGIES:   Allergies  Allergen Reactions  . Vancomycin     Hypotension & hypoxia     VITALS:  Blood pressure 120/58, pulse 74, temperature 98.1 F (36.7 C), temperature source Oral, resp. rate 20, height 5\' 1"  (1.549 m), weight 101.424 kg (223 lb 9.6 oz), SpO2 94 %.  PHYSICAL EXAMINATION:  GENERAL:  70 y.o.-year-old patient lying in the bed with no acute distress.  EYES: Pupils equal, round, reactive to light and accommodation. No scleral icterus. Extraocular muscles intact.  HEENT: Head atraumatic, normocephalic. Oropharynx and nasopharynx clear.  NECK:  Supple, no jugular venous distention. No thyroid enlargement, no tenderness.  LUNGS: Normal breath sounds bilaterally, no wheezing, rales,rhonchi or crepitation. No use of accessory muscles of respiration.  CARDIOVASCULAR: S1, S2 normal. No murmurs, rubs, or gallops.  ABDOMEN: Soft, nontender, nondistended. Bowel sounds present. No organomegaly or mass.  EXTREMITIES: No pedal edema, cyanosis, or clubbing.  NEUROLOGIC:  Cranial nerves II through XII are intact. Muscle strength 5/5 in all extremities. Sensation intact. Gait not checked.  PSYCHIATRIC: The patient is alert and oriented x 3.  SKIN: No obvious rash, lesion, or ulcer.   Physical Exam LABORATORY PANEL:   CBC  Recent Labs Lab 05/27/15 0527  WBC 8.1  HGB 13.5  HCT 41.6  PLT 217   ------------------------------------------------------------------------------------------------------------------  Chemistries   Recent Labs Lab 05/27/15 0527  NA 135  K 3.8  CL 108  CO2 19*  GLUCOSE 139*  BUN 21*  CREATININE 1.07*  CALCIUM 8.6*  AST 17  ALT 11*  ALKPHOS 71  BILITOT 0.6   ------------------------------------------------------------------------------------------------------------------  Cardiac Enzymes  Recent Labs Lab 05/27/15 0527 05/27/15 1123  TROPONINI <0.03 <0.03   ------------------------------------------------------------------------------------------------------------------  RADIOLOGY:  Dg Chest 2 View  05/26/2015  CLINICAL DATA:  Shortness of breath worsening for the past 3 days. Known COPD. Intermittent fever. EXAM: CHEST  2 VIEW COMPARISON:  Chest x-rays dated 08/19/2014 and 04/21/2014. Comparison also made to chest CT dated 04/21/2014. FINDINGS: Borderline cardiomegaly is stable. Surgical changes of CABG. Median sternotomy wires appear intact and stable in alignment. Coarse interstitial markings are again seen bilaterally, compatible with the chronic interstitial lung disease and emphysematous change more definitively characterized on previous chest CT. No evidence of superimposed pneumonia. No pleural effusion or pneumothorax seen. Osseous structures about the chest are unremarkable. IMPRESSION: Evidence of chronic interstitial lung disease, as also described on previous exams. No evidence of acute cardiopulmonary abnormality. No evidence of pneumonia. Borderline cardiomegaly is stable. Electronically Signed   By:  04/23/2014 M.D.   On: 05/26/2015 17:48   Nm  Pulmonary Perf And Vent  05/27/2015  CLINICAL DATA:  Pleuritic chest pain and shortness of breath for 5 days, history COPD, former smoker, CHF, coronary disease post NSTEMI EXAM: NUCLEAR MEDICINE VENTILATION - PERFUSION LUNG SCAN TECHNIQUE: Ventilation images were obtained in multiple projections using inhaled aerosol Tc-91m DTPA. Perfusion images were obtained in multiple projections after intravenous injection of Tc-38m MAA. RADIOPHARMACEUTICALS:  32.99 mCi Technetium-43m DTPA aerosol inhalation and 4.11 mCi Technetium-48m MAA IV COMPARISON:  None FINDINGS: Ventilation: Single tiny subsegmental ventilation defect RIGHT upper lobe. Otherwise normal exam. Swallowed aerosol within stomach. Perfusion: Normal exam Chest radiograph:  Chronic interstitial lung disease.  Post CABG. IMPRESSION: Normal perfusion lung scan. Electronically Signed   By: Ulyses Southward M.D.   On: 05/27/2015 13:40    ASSESSMENT AND PLAN:   Principal Problem:   Chest pain Active Problems:   Chronic diastolic CHF (congestive heart failure) (HCC)   Chronic respiratory failure with hypoxia (HCC)   ILD (interstitial lung disease) (HCC)  * Chest pain   Likely GI or pleuritic origin.    Pain is still there, will add protonix BID.    Cardio may consider work ups as out pt.    Troponin negative stable on tele.  * Ch diastolic CHD * Hx of CAD * ILD    All of that stable, cont home meds.   All the records are reviewed and case discussed with Care Management/Social Workerr. Management plans discussed with the patient, family and they are in agreement.  CODE STATUS: Full.  TOTAL TIME TAKING CARE OF THIS PATIENT: 35 minutes.     POSSIBLE D/C IN *1-2 DAYS, DEPENDING ON CLINICAL CONDITION.   Altamese Dilling M.D on 05/28/2015   Between 7am to 6pm - Pager - [phone]  After 6pm go to [email or web address] - password [email or web address]  [facility]  Office   [phone]  CC: Primary care physician; Emogene Morgan, MD  Note: This dictation was prepared with Dragon dictation along with smaller phrase technology. Any transcriptional errors that result from this process are unintentional.

## 2015-06-10 ENCOUNTER — Encounter: Payer: Medicare PPO | Admitting: Cardiovascular Disease

## 2015-06-17 ENCOUNTER — Ambulatory Visit: Payer: Medicare PPO | Admitting: Family

## 2015-06-18 ENCOUNTER — Encounter: Payer: Self-pay | Admitting: Family

## 2015-06-18 ENCOUNTER — Ambulatory Visit: Payer: Medicare PPO | Attending: Family | Admitting: Family

## 2015-06-18 VITALS — BP 110/62 | HR 74 | Resp 20 | Ht 61.0 in | Wt 220.0 lb

## 2015-06-18 DIAGNOSIS — J410 Simple chronic bronchitis: Secondary | ICD-10-CM

## 2015-06-18 DIAGNOSIS — I5032 Chronic diastolic (congestive) heart failure: Secondary | ICD-10-CM | POA: Insufficient documentation

## 2015-06-18 DIAGNOSIS — E1122 Type 2 diabetes mellitus with diabetic chronic kidney disease: Secondary | ICD-10-CM | POA: Diagnosis not present

## 2015-06-18 DIAGNOSIS — I129 Hypertensive chronic kidney disease with stage 1 through stage 4 chronic kidney disease, or unspecified chronic kidney disease: Secondary | ICD-10-CM | POA: Insufficient documentation

## 2015-06-18 DIAGNOSIS — H409 Unspecified glaucoma: Secondary | ICD-10-CM | POA: Insufficient documentation

## 2015-06-18 DIAGNOSIS — M069 Rheumatoid arthritis, unspecified: Secondary | ICD-10-CM | POA: Insufficient documentation

## 2015-06-18 DIAGNOSIS — I252 Old myocardial infarction: Secondary | ICD-10-CM | POA: Insufficient documentation

## 2015-06-18 DIAGNOSIS — Z7982 Long term (current) use of aspirin: Secondary | ICD-10-CM | POA: Insufficient documentation

## 2015-06-18 DIAGNOSIS — J449 Chronic obstructive pulmonary disease, unspecified: Secondary | ICD-10-CM | POA: Insufficient documentation

## 2015-06-18 DIAGNOSIS — M7989 Other specified soft tissue disorders: Secondary | ICD-10-CM | POA: Diagnosis not present

## 2015-06-18 DIAGNOSIS — E1159 Type 2 diabetes mellitus with other circulatory complications: Secondary | ICD-10-CM

## 2015-06-18 DIAGNOSIS — Z79899 Other long term (current) drug therapy: Secondary | ICD-10-CM | POA: Insufficient documentation

## 2015-06-18 DIAGNOSIS — Z87891 Personal history of nicotine dependence: Secondary | ICD-10-CM | POA: Insufficient documentation

## 2015-06-18 DIAGNOSIS — E785 Hyperlipidemia, unspecified: Secondary | ICD-10-CM | POA: Diagnosis not present

## 2015-06-18 DIAGNOSIS — I10 Essential (primary) hypertension: Secondary | ICD-10-CM

## 2015-06-18 DIAGNOSIS — I2510 Atherosclerotic heart disease of native coronary artery without angina pectoris: Secondary | ICD-10-CM | POA: Diagnosis not present

## 2015-06-18 NOTE — Patient Instructions (Signed)
Continue weighing daily and call for an overnight weight gain of > 2 pounds or a weekly weight gain of >5 pounds. 

## 2015-06-18 NOTE — Progress Notes (Signed)
Subjective:    Patient ID: Toni Marshall, female    DOB: 05-02-1945, 70 y.o.   MRN: [id number]  Congestive Heart Failure Presents for follow-up visit. The disease course has been stable. Associated symptoms include fatigue, palpitations (sometimes with walking) and shortness of breath. Pertinent negatives include no abdominal pain, chest pain, chest pressure, edema or orthopnea. The symptoms have been stable. Past treatments include ACE inhibitors, beta blockers, oxygen and salt and fluid restriction. The treatment provided moderate relief. Compliance with prior treatments has been good. Her past medical history is significant for CAD, chronic lung disease, DM and HTN. She has one 1st degree relative with heart disease.  Hypertension This is a chronic problem. The current episode started more than 1 year ago. The problem is unchanged. The problem is controlled. Associated symptoms include palpitations (sometimes with walking), peripheral edema and shortness of breath. Pertinent negatives include no chest pain, headaches or neck pain. There are no associated agents to hypertension. Risk factors for coronary artery disease include diabetes mellitus, family history, post-menopausal state and obesity. Past treatments include ACE inhibitors, beta blockers, diuretics and lifestyle changes. The current treatment provides moderate improvement. Compliance problems include exercise.  Hypertensive end-organ damage includes kidney disease, CAD/MI and heart failure.    Past Medical History  Diagnosis Date  . H/O acute respiratory failure   . CHF (congestive heart failure) (HCC)     chronic systolic dysfunction with EF of 45%  . NSTEMI (non-ST elevated myocardial infarction) (HCC)   . CAD (coronary artery disease)   . Hypertension   . Cardiogenic shock (HCC)   . Hyperlipidemia   . Glaucoma   . Rheumatoid arthritis(714.0)   . Diabetes mellitus     Type II  . COPD (chronic obstructive pulmonary disease)  (HCC)   . Chronic kidney disease     Past Surgical History  Procedure Laterality Date  . Cardiac catheterization      x2 [facility]  . Cardiac catheterization      [facility]  . Coronary artery bypass graft  2012  . Coronary artery bypass graft  2002  . Eye surgery      left  . Colonoscopy      Family History  Problem Relation Age of Onset  . Heart attack Mother   . Stroke Sister   . Hypertension Father   . Diabetes Father     Social History  Substance Use Topics  . Smoking status: Former Smoker -- 0.50 packs/day for 35 years    Types: Cigarettes  . Smokeless tobacco: Never Used  . Alcohol Use: No    Allergies  Allergen Reactions  . Vancomycin     Hypotension & hypoxia     Prior to Admission medications   Medication Sig Start Date End Date Taking? Authorizing Provider  albuterol (PROVENTIL HFA;VENTOLIN HFA) 108 (90 BASE) MCG/ACT inhaler Inhale 2 puffs into the lungs every 6 (six) hours as needed for wheezing or shortness of breath. Reported on 03/19/2015   Yes Historical Provider, MD  aspirin EC 81 MG tablet Take 81 mg by mouth daily.   Yes Historical Provider, MD  atorvastatin (LIPITOR) 80 MG tablet TAKE 1 TABLET (80 MG TOTAL) BY MOUTH DAILY. 12/03/14  Yes Antonieta Iba, MD  CALCIUM PLUS VITAMIN D3 600-500 MG-UNIT CAPS Take 1 tablet by mouth 2 (two) times daily.  04/10/14  Yes Historical Provider, MD  carvedilol (COREG) 3.125 MG tablet Take 3.125 mg by mouth 2 (two) times daily with a meal.  Yes Historical Provider, MD  clopidogrel (PLAVIX) 75 MG tablet Take 75 mg by mouth daily.   Yes Historical Provider, MD  COMBIGAN 0.2-0.5 % ophthalmic solution Place 1 drop into the left eye 2 (two) times daily. 03/28/15  Yes Historical Provider, MD  docusate sodium (COLACE) 100 MG capsule Take 100 mg by mouth daily as needed for mild constipation.   Yes Historical Provider, MD  dorzolamide (TRUSOPT) 2 % ophthalmic solution Place 1 drop into the left eye 2 (two) times daily.   Yes  Historical Provider, MD  folic acid (FOLVITE) 400 MCG tablet Take 400 mcg by mouth daily.   Yes Historical Provider, MD  furosemide (LASIX) 20 MG tablet Take 20 mg by mouth every other day.    Yes Historical Provider, MD  glipiZIDE (GLUCOTROL XL) 2.5 MG 24 hr tablet Take 2.5 mg by mouth daily with breakfast.   Yes Historical Provider, MD  isosorbide mononitrate (IMDUR) 30 MG 24 hr tablet TAKE 1 TABLET BY MOUTH EVERY DAY 10/26/14  Yes Antonieta Iba, MD  KLOR-CON 10 10 MEQ tablet Take 10 mEq by mouth See admin instructions. Take 1 tablet by mouth 2 times a day every other day with Furosemide. 04/25/15  Yes Historical Provider, MD  lisinopril (PRINIVIL,ZESTRIL) 5 MG tablet Take 1 tablet (5 mg total) by mouth daily. 07/26/14  Yes Delma Freeze, FNP  magnesium oxide (MAG-OX) 400 MG tablet Take 400 mg by mouth 2 (two) times daily.    Yes Historical Provider, MD  OXYGEN Inhale 3 L/min into the lungs continuous.   Yes Historical Provider, MD  pantoprazole (PROTONIX) 40 MG tablet Take 1 tablet (40 mg total) by mouth 2 (two) times daily. 05/28/15  Yes Altamese Dilling, MD     Review of Systems  Constitutional: Positive for fatigue. Negative for appetite change.  HENT: Positive for congestion. Negative for postnasal drip and sore throat.   Eyes: Negative.   Respiratory: Positive for cough (dry cough) and shortness of breath. Negative for chest tightness and wheezing.   Cardiovascular: Positive for palpitations (sometimes with walking) and leg swelling (resolves overnight). Negative for chest pain.  Gastrointestinal: Negative for abdominal pain and abdominal distention.  Endocrine: Negative.   Genitourinary: Negative.   Musculoskeletal: Negative for back pain and neck pain.  Skin: Negative.   Allergic/Immunologic: Negative.   Neurological: Negative for dizziness, light-headedness and headaches.  Hematological: Negative for adenopathy. Does not bruise/bleed easily.  Psychiatric/Behavioral: Negative  for sleep disturbance (sleeping on 1 pillow) and dysphoric mood. The patient is not nervous/anxious.        Objective:   Physical Exam  Constitutional: She is oriented to person, place, and time. She appears well-developed and well-nourished.  HENT:  Head: Normocephalic and atraumatic.  Eyes: Conjunctivae are normal. Pupils are equal, round, and reactive to light.  Neck: Normal range of motion. Neck supple.  Cardiovascular: Normal rate and regular rhythm.   Pulmonary/Chest: Effort normal. She has no wheezes. She has no rales.  Abdominal: Soft. She exhibits no distension. There is no tenderness.  Musculoskeletal: She exhibits no edema or tenderness.  Neurological: She is alert and oriented to person, place, and time.  Skin: Skin is warm and dry.  Psychiatric: She has a normal mood and affect. Her behavior is normal. Thought content normal.  Nursing note and vitals reviewed.   BP 110/62 mmHg  Pulse 74  Resp 20  Ht 5\' 1"  (1.549 m)  Wt 220 lb (99.791 kg)  BMI 41.59 kg/m2  SpO2  91%  LMP  (LMP Unknown)       Assessment & Plan:  1: Chronic heart failure with preserved ejection fraction- Patient presents with fatigue and shortness of breath upon exertion. She says that she was a little short of breath upon walking into the office because she had to park a way's away. She came into the lobby and sat down for a few minutes until her breathing improved and then she was able to continue walking into the office. She continues to weigh herself daily and says that her weight has slowly come down as she's trying to decrease her portion sizes and get moving more. By our scale, she's lost 5 pounds since she was last here on 03/19/15. Reminded to call for an overnight weight gain of >2 pounds or a weekly weight gain of >5 pounds. She does admit to adding "a little" salt to some foods and she was encouraged to not add any salt to her food. She does have some swelling in her legs at the end of the day  but she says that it goes down overnight.  2: HTN- Blood pressure looks good today. 3: COPD- Patient continues to wear her oxygen at 3L when she's home or when she's exerting herself. When she's sitting still, she says that she'll turn it off. Oxygen level was a little low when she arrived but was coming up nicely as she sat here. 4: Diabetes- She didn't check her glucose this morning but says that the last time she checked it, it was 135. She says that she has to make an appointment with her PCP.  Medication bottles were reviewed with the patient.  Return here in 4 months or sooner for any questions/problems before then.

## 2015-07-08 IMAGING — US US BREAST*L* LIMITED INC AXILLA
1 series · 6 of 6 positions shown · non-contrast
Comparison: 01/30/2014, additional prior studies dating back to
05/23/2007

CLINICAL DATA: 68-year-old female with palpable abnormality within
the upper, inner left breast for 3 weeks. The patient denies any
known trauma to the breast. The patient reports that she first
noticed the lump after recently being discharged from the hospital.

EXAM:
DIGITAL DIAGNOSTIC LEFT MAMMOGRAM WITH 3D TOMOSYNTHESIS WITH CAD
ULTRASOUND LEFT BREAST

[Series 1: us breast*left* limited inc axilla · 0.08mm/px · 6 of 6 slices shown]
[im 1/6]
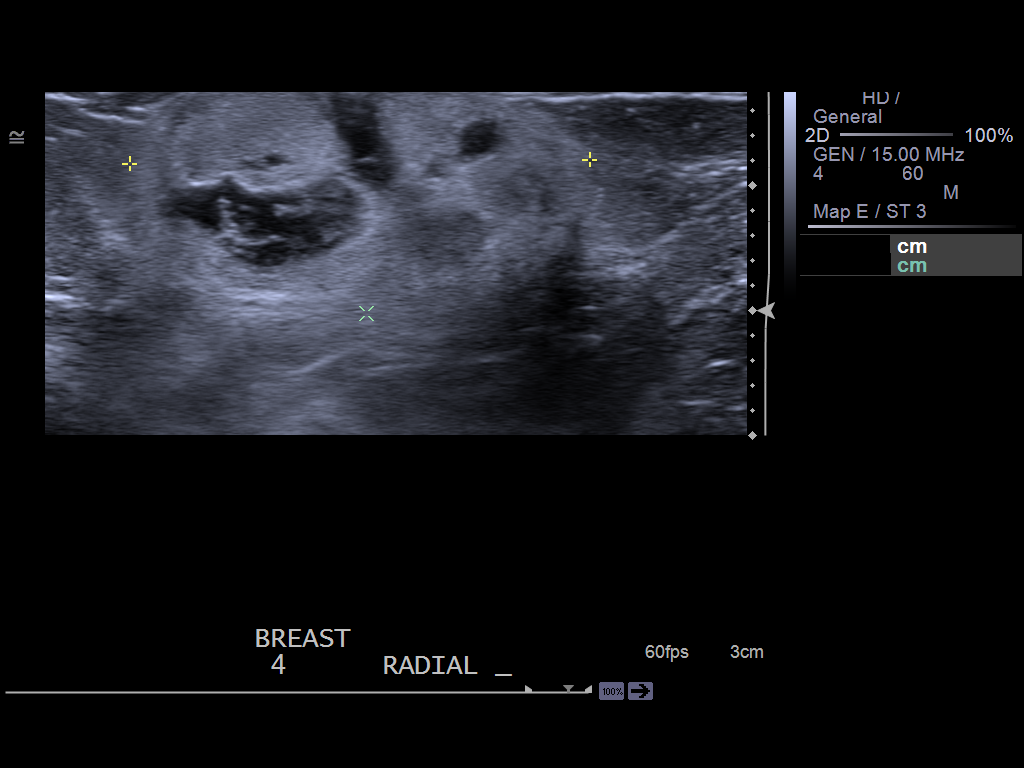
[im 2/6]
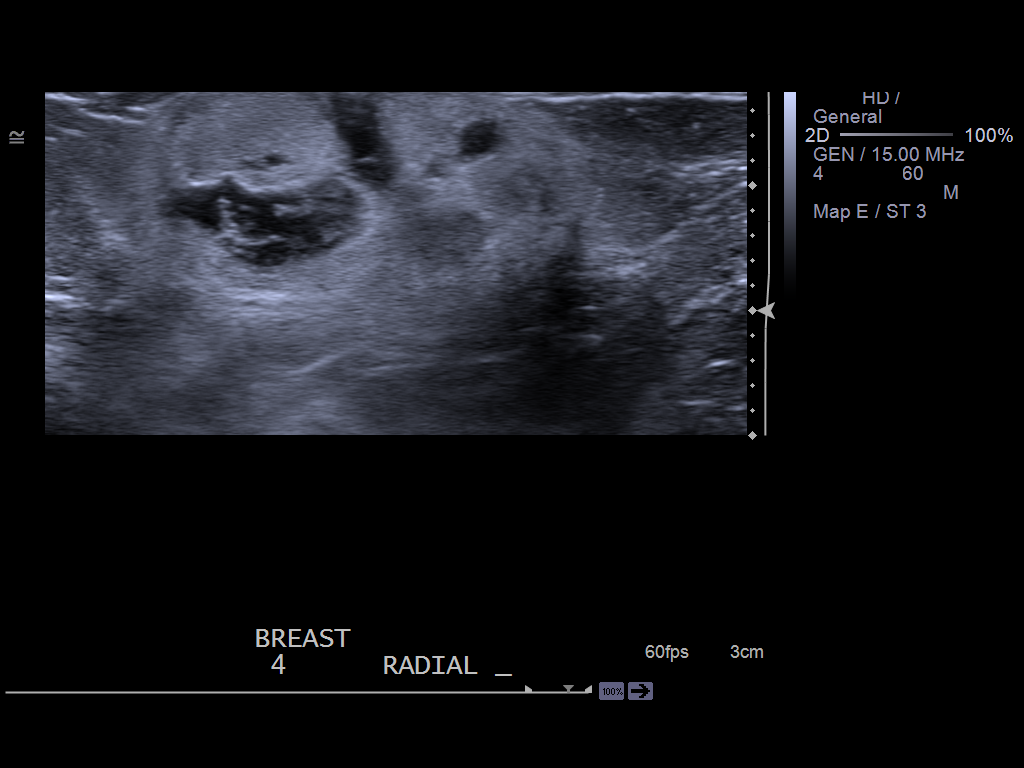
[im 3/6]
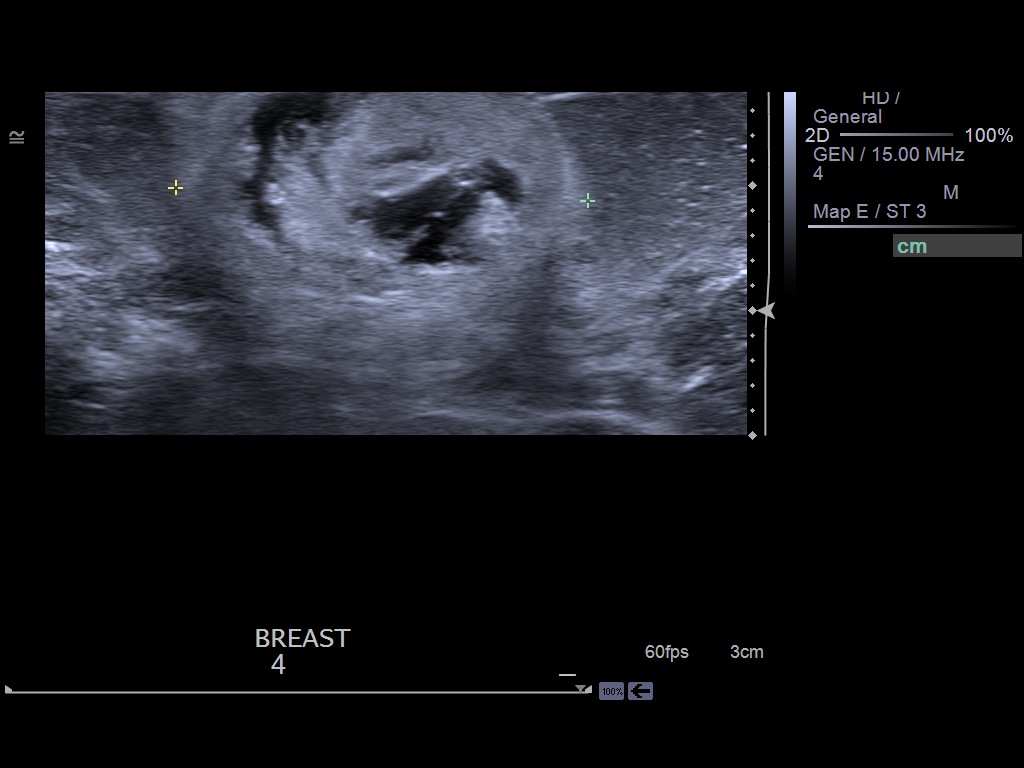
[im 4/6]
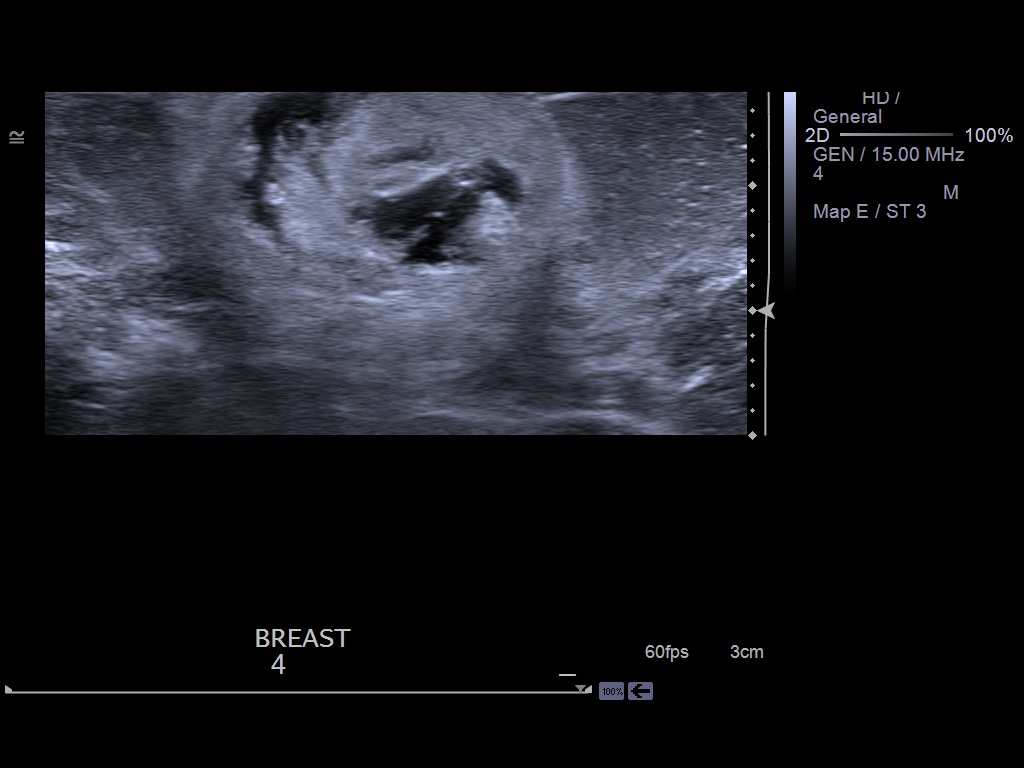
[im 5/6]
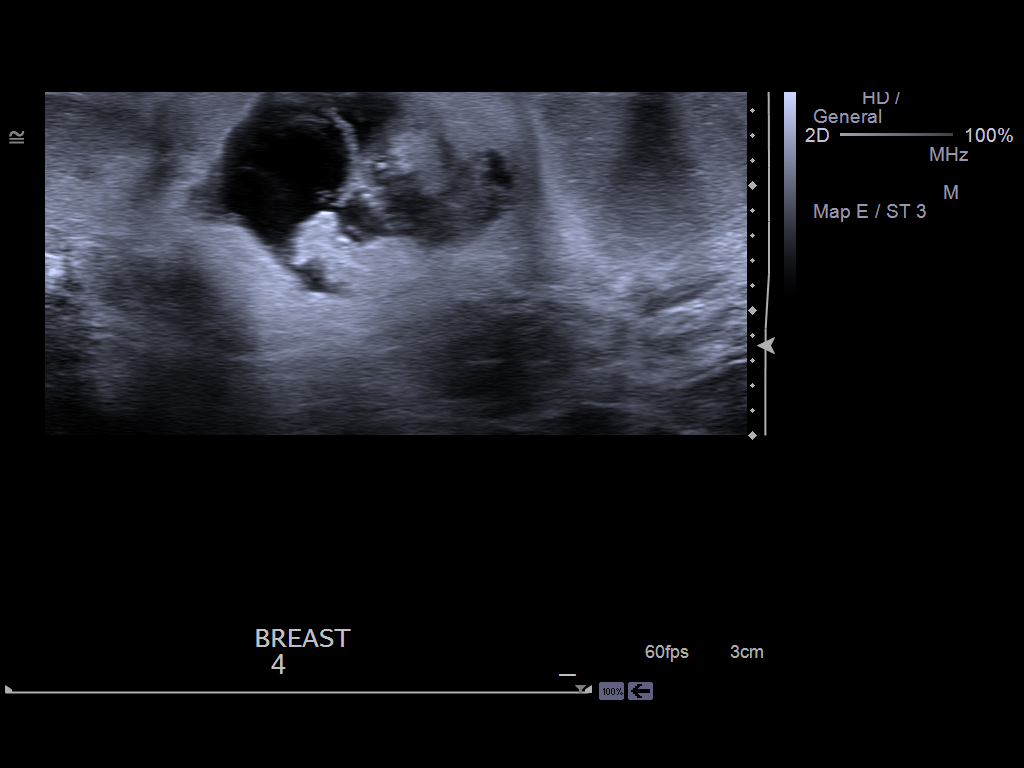
[im 6/6]
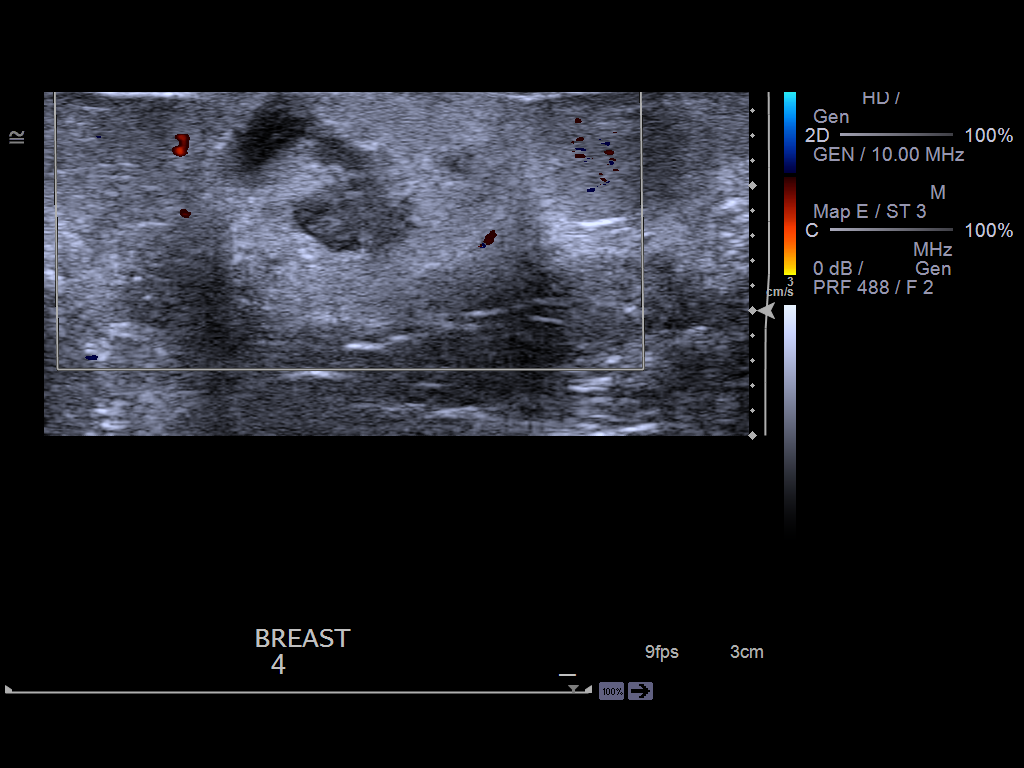

[6 of 6 positions shown; findings below may reference images not displayed]

ACR Breast Density Category b: There are scattered areas of
fibroglandular density.
FINDINGS: Underlying the palpable marker of the upper, inner left breast,
there is an oval mass containing fat density with largely
circumscribed margins measuring 3.4 cm maximally. This mass is new
from recent mammogram in January 2014. No additional abnormality of
the left breast is identified.

Mammographic images were processed with CAD.

On physical exam, there is a palpable oval mass at 11 o'clock, 4 cm
from the nipple. There is slight overlying skin darkening, likely an
area of bruising.

Targeted ultrasound is performed, showing a mixed echogenicity oval
mass at 11 o'clock, 4 cm from the nipple measuring 3.3 x 3.7 x
cm corresponding to the palpable abnormality.
IMPRESSION: Probably benign left breast mass, likely evolving fat necrosis.

RECOMMENDATION:
Left breast ultrasound in 6 weeks.

I have discussed the findings and recommendations with the patient.
Results were also provided in writing at the conclusion of the
visit. If applicable, a reminder letter will be sent to the patient
regarding the next appointment.

BI-RADS CATEGORY  3: Probably benign.

## 2015-07-30 ENCOUNTER — Telehealth: Payer: Self-pay | Admitting: Cardiovascular Disease

## 2015-07-30 MED ORDER — LISINOPRIL 5 MG PO TABS: 5.0000 mg | ORAL_TABLET | Freq: Every day | ORAL | Status: DC

## 2015-07-30 NOTE — Telephone Encounter (Signed)
Pt would like to know if she should still continue on her Lisinopril. She is almost out, and not sure if she needs to refill this medication. Please call and advise

## 2015-07-30 NOTE — Telephone Encounter (Signed)
Spoke w/ pt.  She states that her pharmacy would not refill her lisinopril, so wants to know if this was d/c'd. Advised her that she is out of refills; I am sending in 90 day supply w/ 3 refills to her pharmacy.  Asked her to call back w/ any other questions or concerns.

## 2015-08-19 IMAGING — US US BREAST LTD UNI LEFT INC AXILLA
1 series · 8 of 8 positions shown · non-contrast
Comparison: Previous exam(s).

CLINICAL DATA: Short-term followup for a probable left breast
hematoma.

EXAM:
ULTRASOUND OF THE LEFT BREAST

[Series 1: us breast ltd uni left inc axilla · 0.08mm/px · 8 of 8 slices shown]
[im 1/8]
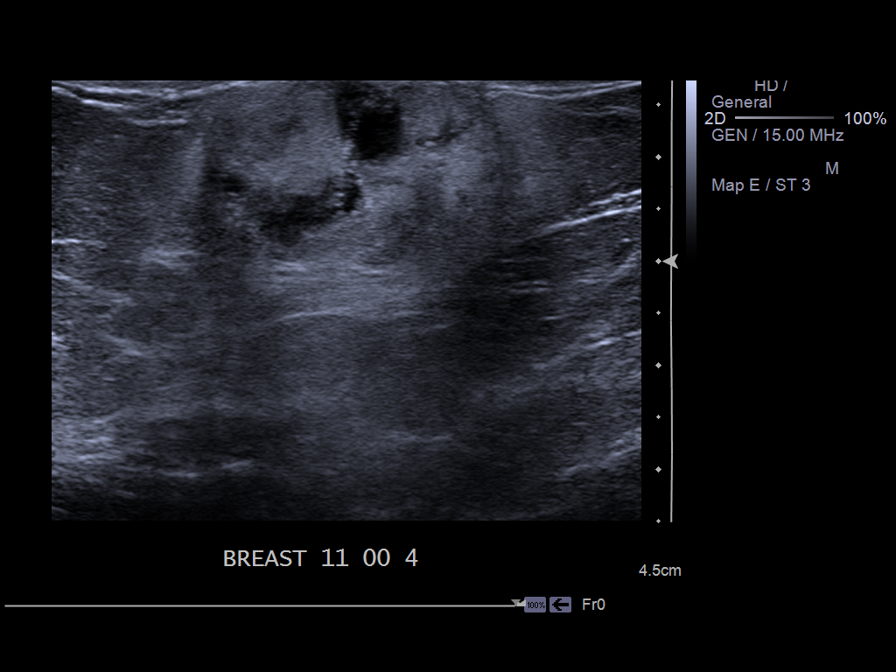
[im 2/8]
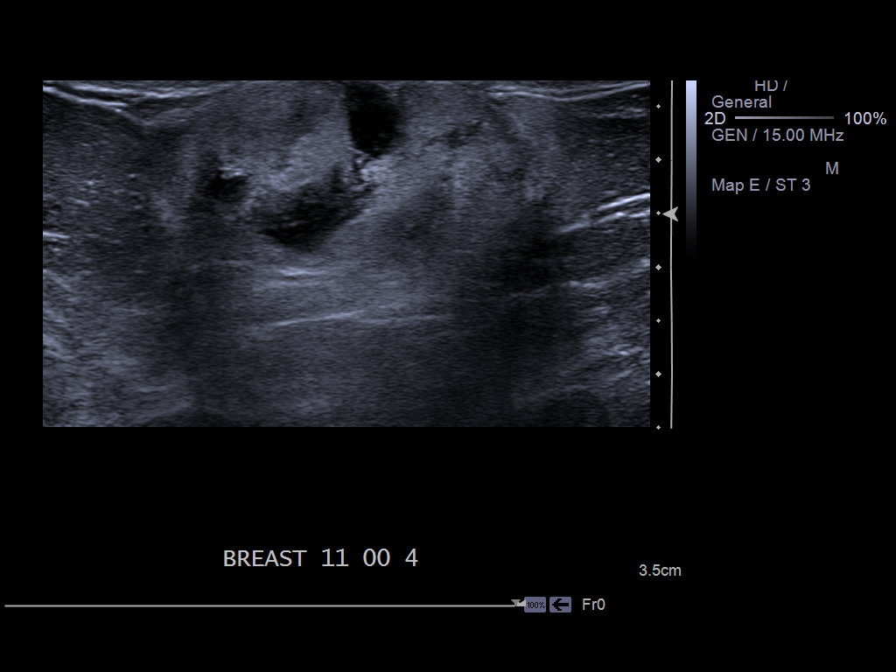
[im 3/8]
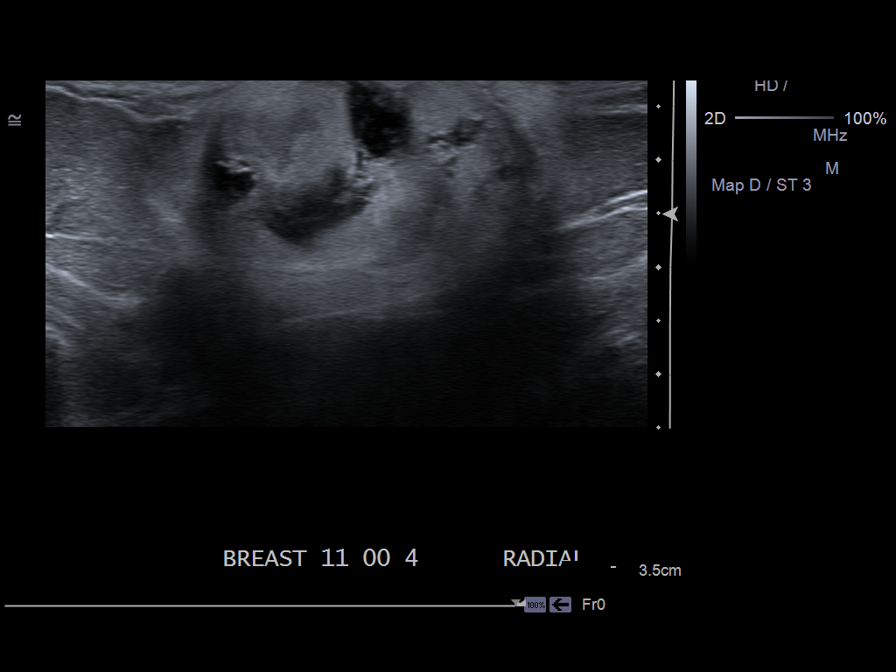
[im 4/8]
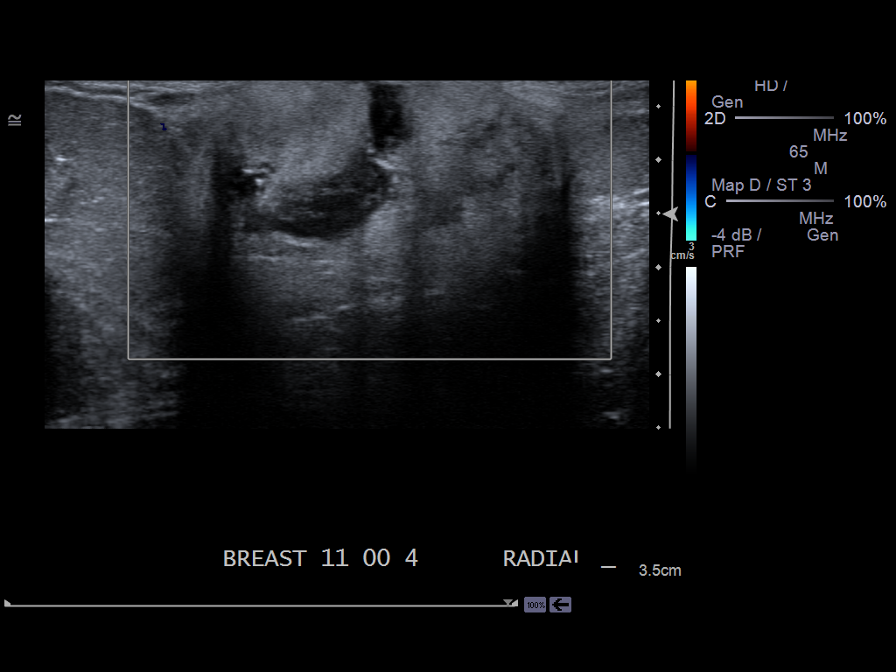
[im 5/8]
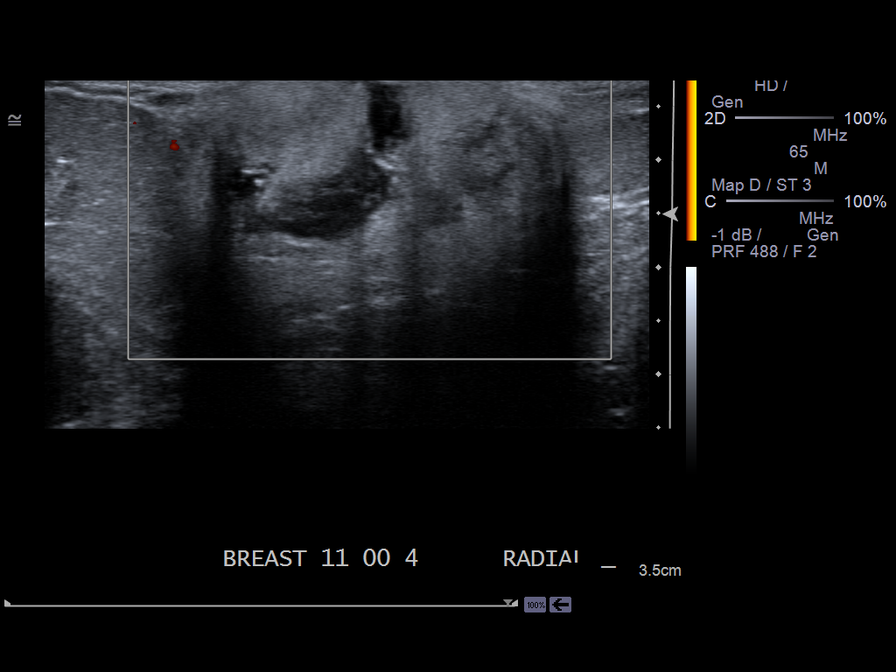
[im 6/8]
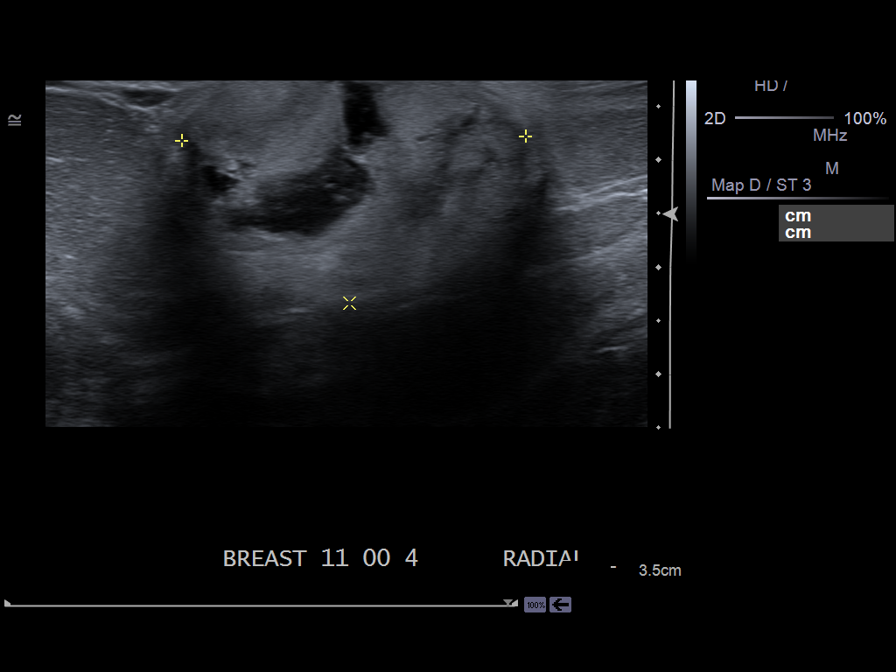
[im 7/8]
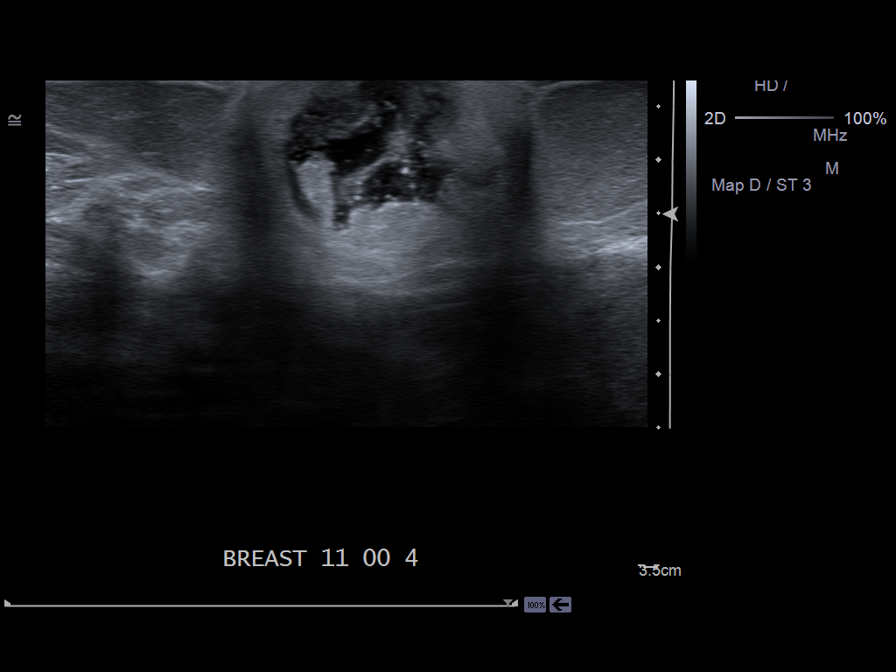
[im 8/8]
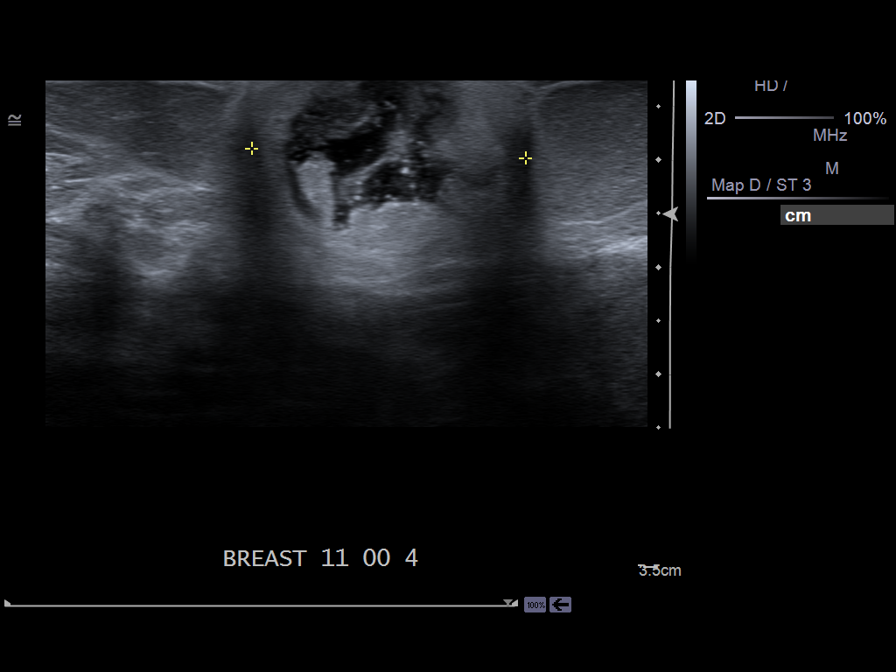

[8 of 8 positions shown; findings below may reference images not displayed]

FINDINGS: On physical exam, masses palpated in 11 o'clock position of the left
breast, just underneath the skin.

Targeted ultrasound is performed, showing heterogeneous mass,
primarily mildly hyperechoic, with irregular areas that are
anechoic. Mass lies in the 11 o'clock position, 4 cm the nipple.
Mass measures 3.2 cm x 2.2 cm x 2.6 cm, previously 3.3 cm x 1.9 cm x
3.7 cm, without significant change allowing for differences in
measurement technique.
IMPRESSION: Probably benign resolving hematoma/fat necrosis in the left breast
at 11 o'clock, 4 cm the nipple. There has been no significant change
in this lesion since the ultrasound performed on 05/29/2014.
Additional short-term follow-up recommended.

RECOMMENDATION:
Repeat left breast ultrasound in 4 months.

I have discussed the findings and recommendations with the patient.
Results were also provided in writing at the conclusion of the
visit. If applicable, a reminder letter will be sent to the patient
regarding the next appointment.

BI-RADS CATEGORY  BI-RADS 3

## 2015-08-21 ENCOUNTER — Other Ambulatory Visit: Payer: Self-pay | Admitting: Family Medicine

## 2015-08-21 DIAGNOSIS — N63 Unspecified lump in unspecified breast: Secondary | ICD-10-CM

## 2015-09-30 ENCOUNTER — Other Ambulatory Visit: Payer: Self-pay | Admitting: Family Medicine

## 2015-09-30 ENCOUNTER — Ambulatory Visit
Admission: RE | Admit: 2015-09-30 | Discharge: 2015-09-30 | Disposition: A | Discharge status: Home / Self Care | Payer: Medicare PPO | Source: Ambulatory Visit | Attending: Family Medicine | Admitting: Family Medicine

## 2015-09-30 DIAGNOSIS — N63 Unspecified lump in unspecified breast: Secondary | ICD-10-CM

## 2015-10-15 ENCOUNTER — Encounter: Payer: Self-pay | Admitting: Family

## 2015-10-15 ENCOUNTER — Ambulatory Visit: Payer: Medicare PPO | Attending: Family | Admitting: Family

## 2015-10-15 VITALS — BP 88/60 | HR 84 | Resp 20 | Ht 61.0 in | Wt 218.0 lb

## 2015-10-15 DIAGNOSIS — Z8249 Family history of ischemic heart disease and other diseases of the circulatory system: Secondary | ICD-10-CM | POA: Insufficient documentation

## 2015-10-15 DIAGNOSIS — Z7982 Long term (current) use of aspirin: Secondary | ICD-10-CM | POA: Insufficient documentation

## 2015-10-15 DIAGNOSIS — N189 Chronic kidney disease, unspecified: Secondary | ICD-10-CM | POA: Insufficient documentation

## 2015-10-15 DIAGNOSIS — E785 Hyperlipidemia, unspecified: Secondary | ICD-10-CM | POA: Diagnosis not present

## 2015-10-15 DIAGNOSIS — I10 Essential (primary) hypertension: Secondary | ICD-10-CM

## 2015-10-15 DIAGNOSIS — Z87891 Personal history of nicotine dependence: Secondary | ICD-10-CM | POA: Insufficient documentation

## 2015-10-15 DIAGNOSIS — J410 Simple chronic bronchitis: Secondary | ICD-10-CM

## 2015-10-15 DIAGNOSIS — Z9889 Other specified postprocedural states: Secondary | ICD-10-CM | POA: Diagnosis not present

## 2015-10-15 DIAGNOSIS — J449 Chronic obstructive pulmonary disease, unspecified: Secondary | ICD-10-CM | POA: Diagnosis not present

## 2015-10-15 DIAGNOSIS — H409 Unspecified glaucoma: Secondary | ICD-10-CM | POA: Insufficient documentation

## 2015-10-15 DIAGNOSIS — Z9981 Dependence on supplemental oxygen: Secondary | ICD-10-CM | POA: Diagnosis not present

## 2015-10-15 DIAGNOSIS — I509 Heart failure, unspecified: Secondary | ICD-10-CM | POA: Diagnosis not present

## 2015-10-15 DIAGNOSIS — Z823 Family history of stroke: Secondary | ICD-10-CM | POA: Diagnosis not present

## 2015-10-15 DIAGNOSIS — Z79899 Other long term (current) drug therapy: Secondary | ICD-10-CM | POA: Insufficient documentation

## 2015-10-15 DIAGNOSIS — I130 Hypertensive heart and chronic kidney disease with heart failure and stage 1 through stage 4 chronic kidney disease, or unspecified chronic kidney disease: Secondary | ICD-10-CM | POA: Diagnosis present

## 2015-10-15 DIAGNOSIS — I5032 Chronic diastolic (congestive) heart failure: Secondary | ICD-10-CM

## 2015-10-15 DIAGNOSIS — E1122 Type 2 diabetes mellitus with diabetic chronic kidney disease: Secondary | ICD-10-CM | POA: Insufficient documentation

## 2015-10-15 DIAGNOSIS — E1159 Type 2 diabetes mellitus with other circulatory complications: Secondary | ICD-10-CM

## 2015-10-15 NOTE — Progress Notes (Signed)
Subjective:    Patient ID: Toni Marshall, female    DOB: 04-26-1945, 70 y.o.   MRN: [id number]  Congestive Heart Failure  Presents for follow-up visit. The disease course has been improving. Associated symptoms include edema, fatigue (mild) and shortness of breath. Pertinent negatives include no abdominal pain, chest pain, orthopnea or palpitations. The symptoms have been improving. Past treatments include beta blockers, ACE inhibitors, oxygen and salt and fluid restriction. The treatment provided moderate relief. Compliance with prior treatments has been good. Her past medical history is significant for CAD, chronic lung disease, DM and HTN. She has multiple 1st degree relatives with heart disease.  Hypertension  This is a chronic problem. The current episode started more than 1 year ago. The problem is unchanged. The problem is controlled. Associated symptoms include peripheral edema and shortness of breath. Pertinent negatives include no chest pain, neck pain or palpitations. There are no associated agents to hypertension. Risk factors for coronary artery disease include diabetes mellitus, family history, post-menopausal state and sedentary lifestyle. Past treatments include beta blockers, diuretics, lifestyle changes and ACE inhibitors. The current treatment provides significant improvement. Compliance problems include exercise.  Hypertensive end-organ damage includes CAD/MI and heart failure.   Past Medical History:  Diagnosis Date  . CAD (coronary artery disease)   . Cardiogenic shock (HCC)   . CHF (congestive heart failure) (HCC)    chronic systolic dysfunction with EF of 45%  . Chronic kidney disease   . COPD (chronic obstructive pulmonary disease) (HCC)   . Diabetes mellitus    Type II  . Glaucoma   . H/O acute respiratory failure   . Hyperlipidemia   . Hypertension   . NSTEMI (non-ST elevated myocardial infarction) (HCC)   . Rheumatoid arthritis(714.0)     Past Surgical  History:  Procedure Laterality Date  . CARDIAC CATHETERIZATION     x2 [facility]  . CARDIAC CATHETERIZATION     [facility]  . COLONOSCOPY    . CORONARY ARTERY BYPASS GRAFT  2012  . CORONARY ARTERY BYPASS GRAFT  2002  . EYE SURGERY     left    Family History  Problem Relation Age of Onset  . Heart attack Mother   . Hypertension Father   . Diabetes Father   . Stroke Sister     Social History  Substance Use Topics  . Smoking status: Former Smoker    Packs/day: 0.50    Years: 35.00    Types: Cigarettes  . Smokeless tobacco: Never Used  . Alcohol use No    Allergies  Allergen Reactions  . Vancomycin     Hypotension & hypoxia     Prior to Admission medications   Medication Sig Start Date End Date Taking? Authorizing Provider  aspirin EC 81 MG tablet Take 81 mg by mouth daily.   Yes Historical Provider, MD  atorvastatin (LIPITOR) 80 MG tablet TAKE 1 TABLET (80 MG TOTAL) BY MOUTH DAILY. 12/03/14  Yes Antonieta Iba, MD  bimatoprost (LUMIGAN) 0.01 % SOLN Place 1 drop into the left eye at bedtime.   Yes Historical Provider, MD  CALCIUM PLUS VITAMIN D3 600-500 MG-UNIT CAPS Take 1 tablet by mouth 2 (two) times daily.  04/10/14  Yes Historical Provider, MD  carvedilol (COREG) 3.125 MG tablet Take 3.125 mg by mouth 2 (two) times daily with a meal.   Yes Historical Provider, MD  clopidogrel (PLAVIX) 75 MG tablet Take 75 mg by mouth daily.   Yes Historical Provider, MD  folic acid (FOLVITE) 400 MCG tablet Take 800 mcg by mouth daily.    Yes Historical Provider, MD  furosemide (LASIX) 20 MG tablet Take 20 mg by mouth every other day.    Yes Historical Provider, MD  glipiZIDE (GLUCOTROL XL) 2.5 MG 24 hr tablet Take 2.5 mg by mouth daily with breakfast.   Yes Historical Provider, MD  isosorbide mononitrate (IMDUR) 30 MG 24 hr tablet TAKE 1 TABLET BY MOUTH EVERY DAY 10/26/14  Yes Antonieta Iba, MD  KLOR-CON 10 10 MEQ tablet Take 10 mEq by mouth See admin instructions. Take 1 tablet by mouth 2  times a day every other day with Furosemide. 04/25/15  Yes Historical Provider, MD  lisinopril (PRINIVIL,ZESTRIL) 5 MG tablet Take 1 tablet (5 mg total) by mouth daily. 07/30/15  Yes Antonieta Iba, MD  magnesium oxide (MAG-OX) 400 MG tablet Take 400 mg by mouth 2 (two) times daily.    Yes Historical Provider, MD  naproxen (NAPROSYN) 500 MG tablet Take 500 mg by mouth 2 (two) times daily as needed.   Yes Historical Provider, MD  OXYGEN Inhale 3 L/min into the lungs continuous.   Yes Historical Provider, MD  docusate sodium (COLACE) 100 MG capsule Take 100 mg by mouth daily as needed for mild constipation.    Historical Provider, MD      Review of Systems  Constitutional: Positive for fatigue (mild). Negative for appetite change.  HENT: Positive for postnasal drip. Negative for congestion and sore throat.   Eyes: Positive for visual disturbance (blurry vision). Negative for pain.  Respiratory: Positive for shortness of breath. Negative for cough, chest tightness and wheezing.   Cardiovascular: Positive for leg swelling. Negative for chest pain and palpitations.  Gastrointestinal: Negative for abdominal distention and abdominal pain.  Endocrine: Negative.   Genitourinary: Negative.   Musculoskeletal: Negative for back pain and neck pain.  Skin: Negative.   Allergic/Immunologic: Negative.   Neurological: Negative for dizziness and light-headedness.  Hematological: Negative for adenopathy. Does not bruise/bleed easily.  Psychiatric/Behavioral: Negative for dysphoric mood and sleep disturbance (sleeping on 3 pillows along with oxygen at 3L). The patient is not nervous/anxious.        Objective:   Physical Exam  Constitutional: She is oriented to person, place, and time. She appears well-developed and well-nourished.  HENT:  Head: Normocephalic and atraumatic.  Eyes: Conjunctivae are normal. Pupils are equal, round, and reactive to light.  Neck: Normal range of motion. Neck supple.   Cardiovascular: Normal rate and regular rhythm.   Pulmonary/Chest: Effort normal. She has no wheezes. She has no rales.  Abdominal: Soft. She exhibits no distension. There is no tenderness.  Musculoskeletal: She exhibits edema (mild amount of pitting edema in bilateral lower legs). She exhibits no tenderness.  Neurological: She is alert and oriented to person, place, and time.  Skin: Skin is warm and dry.  Psychiatric: She has a normal mood and affect. Her behavior is normal. Thought content normal.  Nursing note and vitals reviewed.  BP (!) 88/60   Pulse 84   Resp 20   Ht 5\' 1"  (1.549 m)   Wt 218 lb (98.9 kg)   LMP  (LMP Unknown)   SpO2 96% Comment: on 3L  BMI 41.19 kg/m         Assessment & Plan:  1: Chronic heart failure with preserved ejection fraction- Patient presents with fatigue and shortness of breath with moderate exertion (Class II). She says that [facility] to our office before turning her oxygen on. Once she turned it on and sat down for a few minutes, her breathing quickly improved. She continues to weigh herself and says that she's lost a few pounds because she's trying to eat better. By our scale, she's lost 1.6 pounds since she was last here on 06/18/15. Reminded her to call for an overnight weight gain of >2 pounds or a weekly weight gain of >5 pounds. She doesn't add salt to her food but does cook some foods with it such as when she's boiling chicken. She also says that she eats nabs and chips at her weekly bingo day because that's all the snacks they have. She is aware of the high sodium content of those snacks and tries to not eat much of it. She does have a mild amount of swelling in her legs and admits that she doesn't elevate them much during the day. She does say that the swelling completely resolves overnight after she's had them in the bed. [facility] PharmD went in and reviewed medications with the patient.  2: HTN- Blood pressure on the  low side but is stable for her. Denies any dizziness. May need to decrease medications if she continues to lose weight.  3: COPD- Appears to be stable at this time. Continues to wear her oxygen at 3L with exertion and at bedtime.  4: Diabetes- She says that she hasn't been checking her glucose levels as she's waiting on a new machine from her insurance company. Says that her PCP checks it for her.   Medication bottles were reviewed.  Return here in 6 months or sooner for any questions/problems before then.

## 2015-10-15 NOTE — Patient Instructions (Signed)
Continue weighing daily and call for an overnight weight gain of > 2 pounds or a weekly weight gain of >5 pounds. 

## 2015-10-26 ENCOUNTER — Other Ambulatory Visit: Payer: Self-pay | Admitting: Cardiovascular Disease

## 2015-11-08 ENCOUNTER — Encounter: Payer: Self-pay | Admitting: Cardiovascular Disease

## 2015-11-08 ENCOUNTER — Ambulatory Visit (INDEPENDENT_AMBULATORY_CARE_PROVIDER_SITE_OTHER): Payer: Medicare PPO | Admitting: Cardiovascular Disease

## 2015-11-08 VITALS — BP 130/90 | HR 71 | Ht 61.0 in | Wt 220.0 lb

## 2015-11-08 DIAGNOSIS — E785 Hyperlipidemia, unspecified: Secondary | ICD-10-CM

## 2015-11-08 DIAGNOSIS — I25111 Atherosclerotic heart disease of native coronary artery with angina pectoris with documented spasm: Secondary | ICD-10-CM | POA: Diagnosis not present

## 2015-11-08 DIAGNOSIS — I5032 Chronic diastolic (congestive) heart failure: Secondary | ICD-10-CM

## 2015-11-08 DIAGNOSIS — J432 Centrilobular emphysema: Secondary | ICD-10-CM

## 2015-11-08 DIAGNOSIS — I10 Essential (primary) hypertension: Secondary | ICD-10-CM

## 2015-11-08 DIAGNOSIS — E1159 Type 2 diabetes mellitus with other circulatory complications: Secondary | ICD-10-CM

## 2015-11-08 NOTE — Progress Notes (Signed)
Cardiology Office Note  Date:  11/08/2015   ID:  TIAN DAVISON, DOB 02-Nov-1945, MRN [id number]  PCP:  Emogene Morgan, MD   Chief Complaint  Patient presents with  . Follow-up    SOME SWELLING. NO CP AND SOME SOB. NO OTHER COMPLAINTS.    HPI:  Ms. Mackiewicz is a very pleasant 70 year old woman with history of coronary artery disease, bypass surgery in 2002, repeat bypass November 2012 at [facility], diabetes, hypertension, hyperlipidemia, obesity with non-STEMI 10/29/2011 with presentation to [facility].   She is seen at the [facility] clinic. Her previous anginal symptoms included shortness of breath, possibly indigestion Long history of smoking, 16 years of working in a [employer] needs 3 L oxygen,  She presents today for follow-up of her chronic diastolic CHF  In follow-up today, she reports that she is taking Lasix QOD, Weight stable, around 220 pounds Periodically eats out ("I have a taste for barbecue") Denies any significant changes in her weight  does not have to take extra Lasix for leg edema, shortness of breath symptoms  continues to use 2 L of oxygen at all times.  denies any significant chest pain concerning for angina  no regular exercise program, trying to watch her diet  EKG on today's visit shows normal sinus rhythm with rate 71 bpm,nonspecific ST and T wave abnormality  Other past medical history admitted February 25 with discharge 04/23/2014. Admitted with shortness of breath. No significant improvement in symptoms with diuresis, climb in her creatinine and BUN. 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  Echocardiogram dated 04/05/2014 shows ejection fraction of 60-65%, diastolic relaxation abnormality, otherwise a normal study Cardiac stress test 04/06/2014 for atypical chest pain showed severe scar in the anterior and apical territory, ejection fraction 50%    hospitalization 04/05/2014 for chest pain. She ruled out for MI, had stress test showing no significant ischemia. Significant artifact noted. Symptoms were felt to be somewhat atypical in nature  Typically total cholesterol 130 Previous long complicated hospital course with acute respiratory failure, bibasilar pneumonia. She is on a ventilator and required prolonged resuscitation.  Catheterization in 10/30/2011 showed severe three-vessel coronary artery disease, patent vein graft to the RCA, occluded vein graft to the OM and LIMA to the LAD. Attempted LAD PCI which was unsuccessful.  Left main is moderately calcified, 99% mid LAD followed by a 90% lesion, 50% distal LAD disease, diagonal #2 had 90% ostial disease, 90% mid circumflex, proximal 60% RCA disease, LIMA graft with 100% stenosis at the graft ostium, saphenous vein graft to the OM with 100% ostial graft disease, vein graft to the distal RCA showing 30% proximal graft disease, 40% mid graft disease, ejection fraction 40%  Previous Echocardiogram showing ejection fraction 40-45%, apical akinesis with severe distal anterior and distal inferior wall hypokinesis, mild MR echocardiogram in the hospital 09/16/2012 showed ejection fraction 50-55%, otherwise normal study   PMH:   has a past medical history of CAD (coronary artery disease); Cardiogenic shock (HCC); CHF (congestive heart failure) (HCC); Chronic kidney disease; COPD (chronic obstructive pulmonary disease) (HCC); Diabetes mellitus; Glaucoma; H/O acute respiratory failure; Hyperlipidemia; Hypertension; NSTEMI (non-ST elevated myocardial infarction) (HCC); and Rheumatoid arthritis(714.0).  PSH:    Past Surgical History:  Procedure Laterality Date  . CARDIAC CATHETERIZATION     x2 [facility]  . CARDIAC CATHETERIZATION     [facility]  . COLONOSCOPY    . CORONARY  ARTERY BYPASS GRAFT  2012  . CORONARY ARTERY BYPASS GRAFT  2002  . EYE SURGERY     left    Current Outpatient Prescriptions   Medication Sig Dispense Refill  . aspirin EC 81 MG tablet Take 81 mg by mouth daily.    Marland Kitchen atorvastatin (LIPITOR) 80 MG tablet TAKE 1 TABLET (80 MG TOTAL) BY MOUTH DAILY. 90 tablet 3  . bimatoprost (LUMIGAN) 0.01 % SOLN Place 1 drop into the left eye at bedtime.    Marland Kitchen CALCIUM PLUS VITAMIN D3 600-500 MG-UNIT CAPS Take 1 tablet by mouth 2 (two) times daily.     . carvedilol (COREG) 3.125 MG tablet Take 3.125 mg by mouth 2 (two) times daily with a meal.    . clopidogrel (PLAVIX) 75 MG tablet Take 75 mg by mouth daily.    Marland Kitchen docusate sodium (COLACE) 100 MG capsule Take 100 mg by mouth daily as needed for mild constipation.    . folic acid (FOLVITE) 400 MCG tablet Take 800 mcg by mouth daily.     . furosemide (LASIX) 20 MG tablet Take 20 mg by mouth every other day.     Marland Kitchen glipiZIDE (GLUCOTROL XL) 2.5 MG 24 hr tablet Take 2.5 mg by mouth daily with breakfast.    . isosorbide mononitrate (IMDUR) 30 MG 24 hr tablet TAKE 1 TABLET BY MOUTH EVERY DAY 90 tablet 3  . KLOR-CON 10 10 MEQ tablet Take 10 mEq by mouth See admin instructions. Take 1 tablet by mouth 2 times a day every other day with Furosemide.  5  . lisinopril (PRINIVIL,ZESTRIL) 5 MG tablet Take 1 tablet (5 mg total) by mouth daily. 90 tablet 3  . magnesium oxide (MAG-OX) 400 MG tablet Take 400 mg by mouth 2 (two) times daily.     . naproxen (NAPROSYN) 500 MG tablet Take 500 mg by mouth 2 (two) times daily as needed.    . OXYGEN Inhale 3 L/min into the lungs continuous.     No current facility-administered medications for this visit.      Allergies:   Vancomycin   Social History:  The patient  reports that she has quit smoking. Her smoking use included Cigarettes. She has a 17.50 pack-year smoking history. She has never used smokeless tobacco. She reports that she does not drink alcohol or use drugs.   Family History:   family history includes Diabetes in her father; Heart attack in her mother; Hypertension in her father; Stroke in her  sister.    Review of Systems: Review of Systems  Constitutional: Negative.   Respiratory: Positive for shortness of breath.   Cardiovascular: Positive for leg swelling.  Gastrointestinal: Negative.   Musculoskeletal: Negative.   Neurological: Negative.   Psychiatric/Behavioral: Negative.   All other systems reviewed and are negative.    PHYSICAL EXAM: VS:  BP 130/90 (BP Location: Left Arm, Patient Position: Sitting, Cuff Size: Normal)   Pulse 71   Ht 5\' 1"  (1.549 m)   Wt 220 lb (99.8 kg)   LMP  (LMP Unknown)   SpO2 (!) 89%   BMI 41.57 kg/m  , BMI Body mass index is 41.57 kg/m. GEN: Well nourished, well developed, in no acute distress, obese HEENT: normal  Neck: no JVD, carotid bruits, or masses Cardiac: RRR; no murmurs, rubs, or gallops, trace nonpitting b/l edema  Respiratory:  clear to auscultation bilaterally, normal work of breathing GI: soft, nontender, nondistended, + BS MS: no deformity or atrophy  Skin: warm and dry,  no rash Neuro:  Strength and sensation are intact Psych: euthymic mood, full affect    Recent Labs: 05/26/2015: B Natriuretic Peptide 96.0 05/27/2015: ALT 11; BUN 21; Creatinine, Ser 1.07; Hemoglobin 13.5; Platelets 217; Potassium 3.8; Sodium 135    Lipid Panel Lab Results  Component Value Date   CHOL 110 09/17/2012   HDL 47 09/17/2012   LDLCALC 49 09/17/2012   TRIG 69 09/17/2012      Wt Readings from Last 3 Encounters:  11/08/15 220 lb (99.8 kg)  10/15/15 218 lb (98.9 kg)  06/18/15 220 lb (99.8 kg)       ASSESSMENT AND PLAN:  Coronary artery disease involving native coronary artery of native heart with angina pectoris with documented spasm (HCC) - Plan: EKG 12-Lead Currently with no symptoms of angina. No further workup at this time. Continue current medication regimen.  Centrilobular emphysema (HCC) - Plan: EKG 12-Lead Followed by pulmonary, chronic hypoxia, uses oxygen during the daytime, generator at night Does not appear to be  taking any inhalers  Chronic diastolic CHF (congestive heart failure) (HCC) - Plan: EKG 12-Lead Long discussion today concerning CHF management, watching her weight, modifying her diet. Recommended she take extra Lasix for any worsening shortness of breath, leg edema, weight gain. Suggested she try not to eat out  Hyperlipidemia No recent lipid panel available. Will defer to primary care, encouraged her to stand her Lipitor  Essential hypertension Blood pressure is well controlled on today's visit. No changes made to the medications.  Type 2 diabetes mellitus with other circulatory complication, without long-term current use of insulin (HCC) Long discussion concerning her diet  We have encouraged continued exercise, careful diet management in an effort to lose weight.  Morbid obesity due to excess calories (HCC)    Total encounter time more than 25 minutes  Greater than 50% was spent in counseling and coordination of care with the patient  Disposition:   F/U  6 months   Orders Placed This Encounter  Procedures  . EKG 12-Lead     Signed, Dossie Arbour, M.D., Ph.D. 11/08/2015  [facility] [place], [place] [phone]

## 2015-11-08 NOTE — Patient Instructions (Addendum)
Medication Instructions:   No medication changes made  If you need more oxygen or shortness of breath, Take extra lasix  For weight gain, take extra lasix  Labwork:  No new labs needed  Testing/Procedures:  No further testing at this time   Follow-Up: It was a pleasure seeing you in the office today. Please call us if you have new issues that need to be addressed before your next appt.  [phone]  Your physician wants you to follow-up in: 6 months.  You will receive a reminder letter in the mail two months in advance. If you don't receive a letter, please call our office to schedule the follow-up appointment.  If you need a refill on your cardiac medications before your next appointment, please call your pharmacy.

## 2015-12-07 ENCOUNTER — Other Ambulatory Visit: Payer: Self-pay | Admitting: Cardiovascular Disease

## 2016-04-07 ENCOUNTER — Ambulatory Visit: Payer: Medicare PPO | Admitting: Family

## 2016-04-14 ENCOUNTER — Ambulatory Visit: Payer: Medicare PPO | Admitting: Family

## 2016-04-24 ENCOUNTER — Telehealth: Payer: Self-pay | Admitting: Cardiovascular Disease

## 2016-04-24 ENCOUNTER — Other Ambulatory Visit: Payer: Self-pay | Admitting: *Deleted

## 2016-04-24 MED ORDER — CARVEDILOL 3.125 MG PO TABS: 3.1250 mg | ORAL_TABLET | Freq: Two times a day (BID) | ORAL | 1 refills | Status: AC

## 2016-04-24 MED ORDER — LISINOPRIL 5 MG PO TABS: 5.0000 mg | ORAL_TABLET | Freq: Every day | ORAL | 1 refills | Status: AC

## 2016-04-24 MED ORDER — CLOPIDOGREL BISULFATE 75 MG PO TABS: 75.0000 mg | ORAL_TABLET | Freq: Every day | ORAL | 1 refills | Status: AC

## 2016-04-24 NOTE — Telephone Encounter (Signed)
° °*  STAT* If patient is at the pharmacy, call can be transferred to refill team.   1. Which medications need to be refilled? (please list name of each medication and dose if known)  Carvedilol 3.125 mg  Clopidogrel 75 mg Lisinopril 5 mg   2. Which pharmacy/location (including street and city if local pharmacy) is medication to be sent to? [facility] in [place]   3. Do they need a 30 day or 90 day supply?  Just enough until her mail order comes in

## 2016-04-24 NOTE — Telephone Encounter (Signed)
Requested Prescriptions   Signed Prescriptions Disp Refills  . clopidogrel (PLAVIX) 75 MG tablet 14 tablet 1    Sig: Take 1 tablet (75 mg total) by mouth daily.    Authorizing Provider: Antonieta Iba    Ordering User: Shawnie Dapper, Tywana Robotham C  . carvedilol (COREG) 3.125 MG tablet 28 tablet 1    Sig: Take 1 tablet (3.125 mg total) by mouth 2 (two) times daily with a meal.    Authorizing Provider: Antonieta Iba    Ordering User: Geoffry Bannister C  . lisinopril (PRINIVIL,ZESTRIL) 5 MG tablet 14 tablet 1    Sig: Take 1 tablet (5 mg total) by mouth daily.    Authorizing Provider: Antonieta Iba    Ordering User: Kendrick Fries

## 2016-04-24 NOTE — Telephone Encounter (Signed)
Requested Prescriptions   Signed Prescriptions Disp Refills  . clopidogrel (PLAVIX) 75 MG tablet 14 tablet 1    Sig: Take 1 tablet (75 mg total) by mouth daily.    Authorizing Provider: GOLLAN, TIMOTHY J    Ordering User: LOPEZ, MARINA C  . carvedilol (COREG) 3.125 MG tablet 28 tablet 1    Sig: Take 1 tablet (3.125 mg total) by mouth 2 (two) times daily with a meal.    Authorizing Provider: GOLLAN, TIMOTHY J    Ordering User: LOPEZ, MARINA C  . lisinopril (PRINIVIL,ZESTRIL) 5 MG tablet 14 tablet 1    Sig: Take 1 tablet (5 mg total) by mouth daily.    Authorizing Provider: GOLLAN, TIMOTHY J    Ordering User: LOPEZ, MARINA C    

## 2016-05-07 NOTE — Progress Notes (Signed)
Cardiology Office Note  Date:  05/08/2016   ID:  KEISA BLOW, DOB 1945-11-25, MRN [id number]  PCP:  Emogene Morgan, MD   Chief Complaint  Patient presents with  . other    6 month follow up. Meds reviewed by the pt. verbally. "doing well."     HPI:  Ms. Tonkovich is a very pleasant 71 year old woman with history of coronary artery disease, bypass surgery in 2002, repeat bypass November 2012 at [facility], diabetes, hypertension, hyperlipidemia, obesity with non-STEMI 10/29/2011 with presentation to [facility].  She is seen at the [facility] clinic. Her previous anginal symptoms included shortness of breath, possibly indigestion Long history of smoking, 16 years of working in a [employer] needs 3 L oxygen,  She presents today for follow-up of her chronic diastolic CHF  In follow-up she reports that she is doing well, Did not go to CHF clinic Nurse from [facility] will call her, giving her info what to do Weight down 6 to 8 pounds She continues to take Lasix every other day Periodically eats out ("I have a taste for barbecue")  does not have to take extra Lasix for leg edema,   She does have shortness of breath, has not been taking extra Lasix No exercise continues to use 2 L of oxygen at all times. No significant chest pain concerning for angina Doing some rubber bands but no regular exercise program  Lab work reviewed with her today in detail HBA1C 6.6 up to 7.2 up to 7.5 CR 1.27,  Total chol 110, LDL57  EKG on today's visit shows normal sinus rhythm with rate 67 bpm,nonspecific ST and T wave abnormality  Other past medical history admitted February 25 with discharge 04/23/2014. Admitted with shortness of breath. No significant improvement in symptoms with diuresis, climb in her creatinine and BUN. 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  Echocardiogram dated 04/05/2014  shows ejection fraction of 60-65%, diastolic relaxation abnormality, otherwise a normal study Cardiac stress test 04/06/2014 for atypical chest pain showed severe scar in the anterior and apical territory, ejection fraction 50%  hospitalization 04/05/2014 for chest pain. She ruled out for MI, had stress test showing no significant ischemia. Significant artifact noted. Symptoms were felt to be somewhat atypical in nature  Typically total cholesterol 130 Previous long complicated hospital course with acute respiratory failure, bibasilar pneumonia. She is on a ventilator and required prolonged resuscitation.  Catheterization in 10/30/2011 showed severe three-vessel coronary artery disease, patent vein graft to the RCA, occluded vein graft to the OM and LIMA to the LAD. Attempted LAD PCI which was unsuccessful. Left main is moderately calcified, 99% mid LAD followed by a 90% lesion, 50% distal LAD disease, diagonal #2 had 90% ostial disease, 90% mid circumflex, proximal 60% RCA disease, LIMA graft with 100% stenosis at the graft ostium, saphenous vein graft to the OM with 100% ostial graft disease, vein graft to the distal RCA showing 30% proximal graft disease, 40% mid graft disease, ejection fraction 40%  Previous Echocardiogram showing ejection fraction 40-45%, apical akinesis with severe distal anterior and distal inferior wall hypokinesis, mild MR echocardiogram in the hospital 09/16/2012 showed ejection fraction 50-55%, otherwise normal study  PMH:   has a past medical history of CAD (coronary artery disease); Cardiogenic shock (HCC); CHF (congestive heart failure) (HCC); Chronic kidney disease; COPD (chronic obstructive pulmonary disease) (HCC); Diabetes mellitus; Glaucoma; H/O acute respiratory failure; Hyperlipidemia;  Hypertension; NSTEMI (non-ST elevated myocardial infarction) (HCC); and Rheumatoid arthritis(714.0).  PSH:    Past Surgical History:  Procedure Laterality Date  . CARDIAC  CATHETERIZATION     x2 [facility]  . CARDIAC CATHETERIZATION     [facility]  . COLONOSCOPY    . CORONARY ARTERY BYPASS GRAFT  2012  . CORONARY ARTERY BYPASS GRAFT  2002  . EYE SURGERY     left    Current Outpatient Prescriptions  Medication Sig Dispense Refill  . aspirin EC 81 MG tablet Take 81 mg by mouth daily.    Marland Kitchen atorvastatin (LIPITOR) 80 MG tablet TAKE 1 TABLET (80 MG TOTAL) BY MOUTH DAILY. 90 tablet 3  . bimatoprost (LUMIGAN) 0.01 % SOLN Place 1 drop into the left eye at bedtime.    Marland Kitchen CALCIUM PLUS VITAMIN D3 600-500 MG-UNIT CAPS Take 1 tablet by mouth 2 (two) times daily.     . carvedilol (COREG) 3.125 MG tablet Take 1 tablet (3.125 mg total) by mouth 2 (two) times daily with a meal. 28 tablet 1  . clopidogrel (PLAVIX) 75 MG tablet Take 1 tablet (75 mg total) by mouth daily. 14 tablet 1  . docusate sodium (COLACE) 100 MG capsule Take 100 mg by mouth daily as needed for mild constipation.    . folic acid (FOLVITE) 400 MCG tablet Take 800 mcg by mouth daily.     . furosemide (LASIX) 20 MG tablet Take 20 mg by mouth every other day.     Marland Kitchen glipiZIDE (GLUCOTROL XL) 2.5 MG 24 hr tablet Take 2.5 mg by mouth daily with breakfast.    . isosorbide mononitrate (IMDUR) 30 MG 24 hr tablet TAKE 1 TABLET BY MOUTH EVERY DAY 90 tablet 3  . KLOR-CON 10 10 MEQ tablet Take 10 mEq by mouth See admin instructions. Take 1 tablet by mouth 2 times a day every other day with Furosemide.  5  . lisinopril (PRINIVIL,ZESTRIL) 5 MG tablet Take 1 tablet (5 mg total) by mouth daily. 14 tablet 1  . magnesium oxide (MAG-OX) 400 MG tablet Take 400 mg by mouth 2 (two) times daily.     . naproxen (NAPROSYN) 500 MG tablet Take 500 mg by mouth 2 (two) times daily as needed.    . OXYGEN Inhale 3 L/min into the lungs continuous.     No current facility-administered medications for this visit.      Allergies:   Vancomycin   Social History:  The patient  reports that she has quit smoking. Her smoking use included Cigarettes. She  has a 17.50 pack-year smoking history. She has never used smokeless tobacco. She reports that she does not drink alcohol or use drugs.   Family History:   family history includes Diabetes in her father; Heart attack in her mother; Hypertension in her father; Stroke in her sister.    Review of Systems: Review of Systems  Constitutional: Negative.   Respiratory: Positive for shortness of breath.   Cardiovascular: Negative.   Gastrointestinal: Negative.   Musculoskeletal: Negative.   Neurological: Negative.   Psychiatric/Behavioral: Negative.   All other systems reviewed and are negative.    PHYSICAL EXAM: VS:  BP 110/70 (BP Location: Left Arm, Patient Position: Sitting, Cuff Size: Normal)   Pulse 67   Ht 5\' 1"  (1.549 m)   Wt 212 lb 8 oz (96.4 kg)   LMP  (LMP Unknown)   BMI 40.15 kg/m  , BMI Body mass index is 40.15 kg/m. GEN: Well nourished, well developed, in  no acute distress , obese HEENT: normal  Neck: no JVD, carotid bruits, or masses Cardiac: RRR; no murmurs, rubs, or gallops,no edema  Respiratory:  clear to auscultation bilaterally, normal work of breathing GI: soft, nontender, nondistended, + BS MS: no deformity or atrophy  Skin: warm and dry, no rash Neuro:  Strength and sensation are intact Psych: euthymic mood, full affect    Recent Labs: 05/26/2015: B Natriuretic Peptide 96.0 05/27/2015: ALT 11; BUN 21; Creatinine, Ser 1.07; Hemoglobin 13.5; Platelets 217; Potassium 3.8; Sodium 135    Lipid Panel Lab Results  Component Value Date   CHOL 110 09/17/2012   HDL 47 09/17/2012   LDLCALC 49 09/17/2012   TRIG 69 09/17/2012      Wt Readings from Last 3 Encounters:  05/08/16 212 lb 8 oz (96.4 kg)  11/08/15 220 lb (99.8 kg)  10/15/15 218 lb (98.9 kg)       ASSESSMENT AND PLAN:  Coronary artery disease involving coronary bypass graft of native heart without angina pectoris - Plan: EKG 12-Lead Currently with no symptoms of angina. No further workup at this  time. Continue current medication regimen.  Mixed hyperlipidemia - Plan: EKG 12-Lead Cholesterol is at goal on the current lipid regimen. No changes to the medications were made.  Chronic diastolic CHF (congestive heart failure) (HCC) - Plan: EKG 12-Lead Appears relatively euvolemic though recommended she take extra Lasix every time she eats out at a restaurant. Otherwise continue every other day Also take extra Lasix for worsening shortness of breath on exertion Weight is down 6-8 pounds   Essential hypertension - Plan: EKG 12-Lead Blood pressure is well controlled on today's visit. No changes made to the medications.  Type 2 diabetes mellitus with other circulatory complication, without long-term current use of insulin (HCC) - Plan: EKG 12-Lead Hemoglobin A1c trending upwards, long discussion about her diet and dietary changes needed, further weight loss   Simple chronic bronchitis (HCC) - Plan: EKG 12-Lead Lungs relatively clear, no recent exacerbation   ILD (interstitial lung disease) (HCC) - Plan: EKG 12-Lead Reports oxygenation requirements are stable, on nasal cannula oxygen   Total encounter time more than 25 minutes  Greater than 50% was spent in counseling and coordination of care with the patient   Disposition:   F/U  6 months   Orders Placed This Encounter  Procedures  . EKG 12-Lead     Signed, Dossie Arbour, M.D., Ph.D. 05/08/2016  [facility] [place], [place] [phone]

## 2016-05-08 ENCOUNTER — Encounter: Payer: Self-pay | Admitting: Cardiovascular Disease

## 2016-05-08 ENCOUNTER — Ambulatory Visit (INDEPENDENT_AMBULATORY_CARE_PROVIDER_SITE_OTHER): Payer: Medicare PPO | Admitting: Cardiovascular Disease

## 2016-05-08 VITALS — BP 110/70 | HR 67 | Ht 61.0 in | Wt 212.5 lb

## 2016-05-08 DIAGNOSIS — J849 Interstitial pulmonary disease, unspecified: Secondary | ICD-10-CM | POA: Diagnosis not present

## 2016-05-08 DIAGNOSIS — E1159 Type 2 diabetes mellitus with other circulatory complications: Secondary | ICD-10-CM | POA: Diagnosis not present

## 2016-05-08 DIAGNOSIS — J410 Simple chronic bronchitis: Secondary | ICD-10-CM | POA: Diagnosis not present

## 2016-05-08 DIAGNOSIS — I5032 Chronic diastolic (congestive) heart failure: Secondary | ICD-10-CM | POA: Diagnosis not present

## 2016-05-08 DIAGNOSIS — E782 Mixed hyperlipidemia: Secondary | ICD-10-CM

## 2016-05-08 DIAGNOSIS — I10 Essential (primary) hypertension: Secondary | ICD-10-CM

## 2016-05-08 DIAGNOSIS — I25810 Atherosclerosis of coronary artery bypass graft(s) without angina pectoris: Secondary | ICD-10-CM

## 2016-05-08 MED ORDER — FUROSEMIDE 20 MG PO TABS: 20.0000 mg | ORAL_TABLET | Freq: Every day | ORAL | 3 refills | Status: DC | PRN

## 2016-05-08 NOTE — Patient Instructions (Addendum)
Watch the sugars Take extra lasix/potassium anytime you eat out  Medication Instructions:   No medication changes made  Labwork:  No new labs needed  Testing/Procedures:  No further testing at this time   I recommend watching educational videos on topics of interest to you at:       [email or web address].goemmi.com  Enter code: HEARTCARE    Follow-Up: It was a pleasure seeing you in the office today. Please call us if you have new issues that need to be addressed before your next appt.  [phone]  Your physician wants you to follow-up in: 6 months.  You will receive a reminder letter in the mail two months in advance. If you don't receive a letter, please call our office to schedule the follow-up appointment.  If you need a refill on your cardiac medications before your next appointment, please call your pharmacy.

## 2016-05-10 IMAGING — US US BREAST LTD UNI LEFT INC AXILLA
1 series · 10 of 10 positions shown · non-contrast
Comparison: Previous exam(s).

CLINICAL DATA: Follow-up of probably benign left breast 11 o'clock
area of fat necrosis.

EXAM:
DIGITAL DIAGNOSTIC BILATERAL MAMMOGRAM WITH 3D TOMOSYNTHESIS WITH
CAD
ULTRASOUND LEFT BREAST

[Series 1: us breast ltd uni left inc axilla · 0.07mm/px · 10 of 10 slices shown]
[im 1/10]
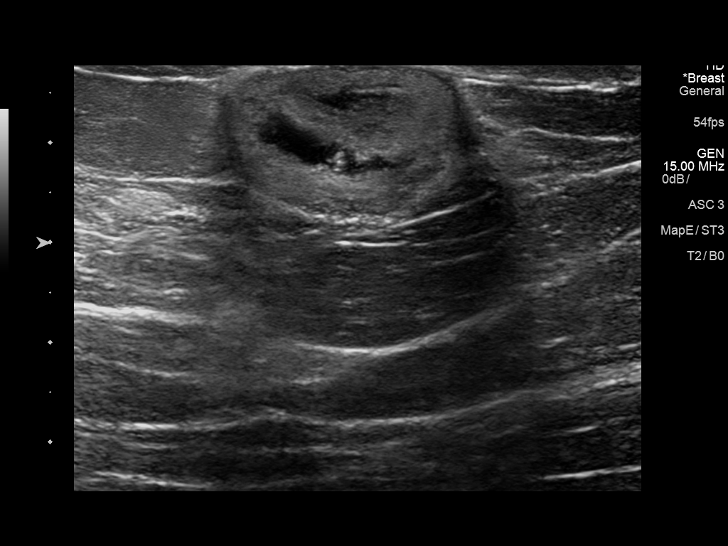
[im 2/10]
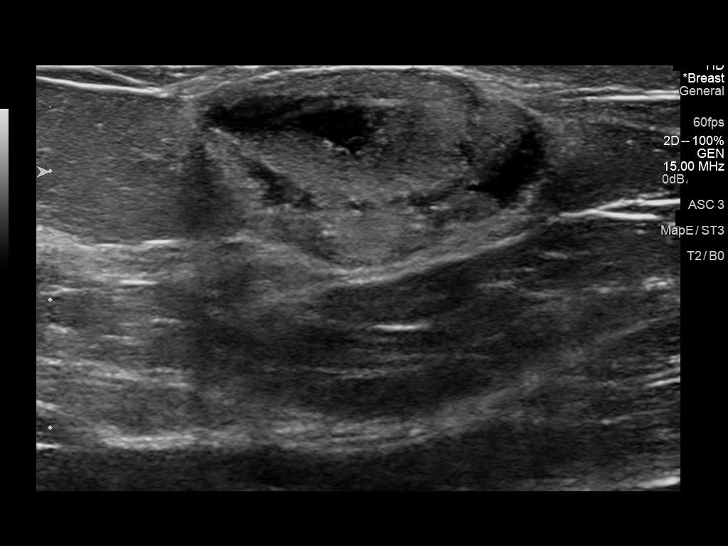
[im 3/10]
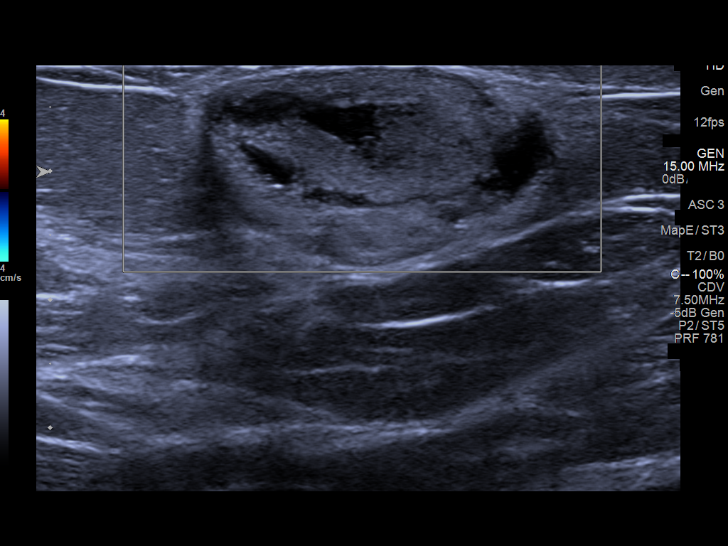
[im 4/10]
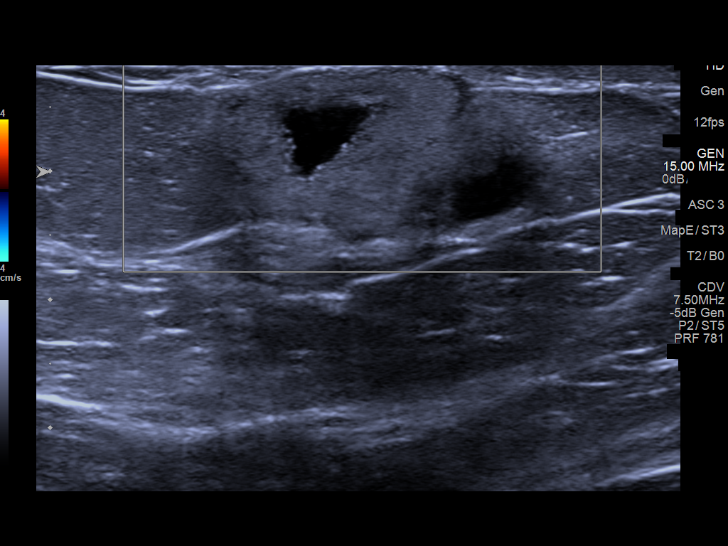
[im 5/10]
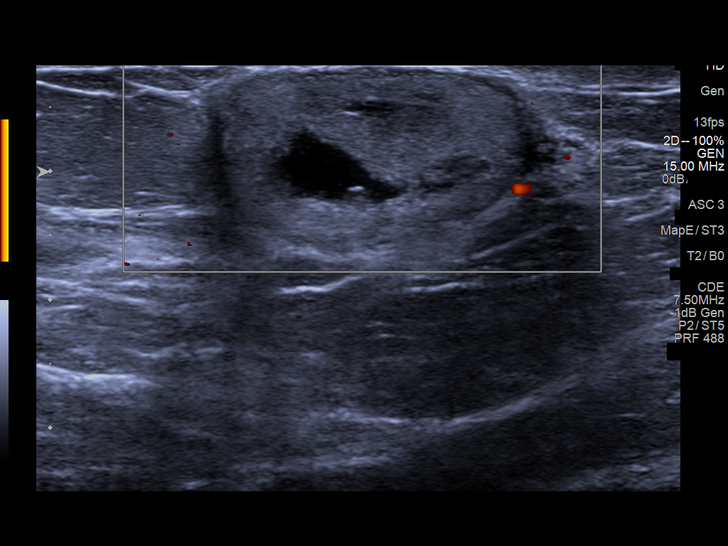
[im 6/10]
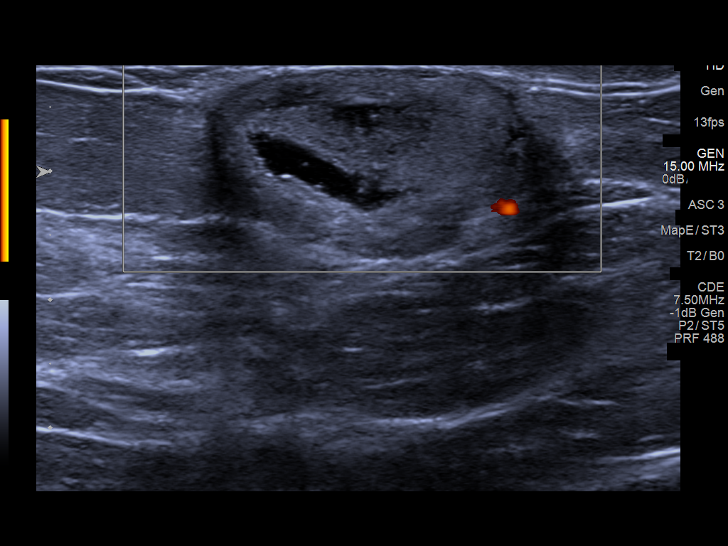
[im 7/10]
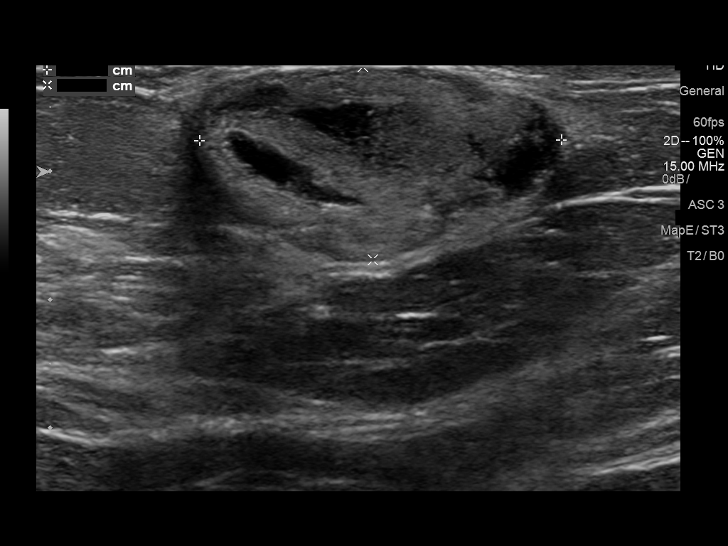
[im 8/10]
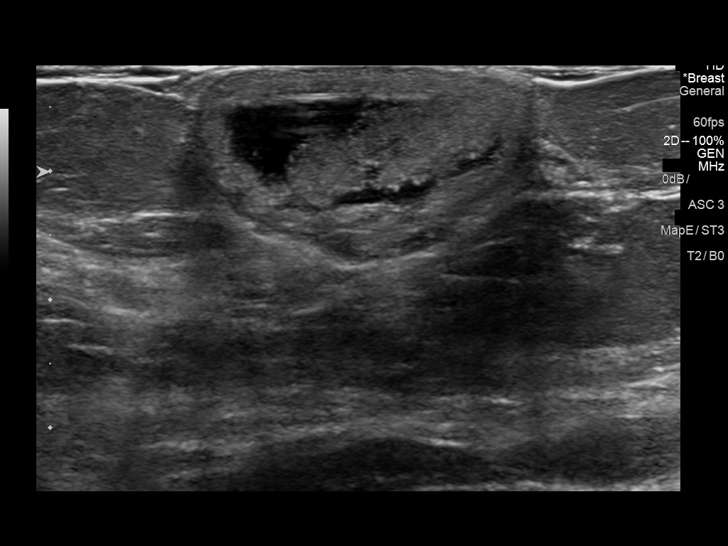
[im 9/10]
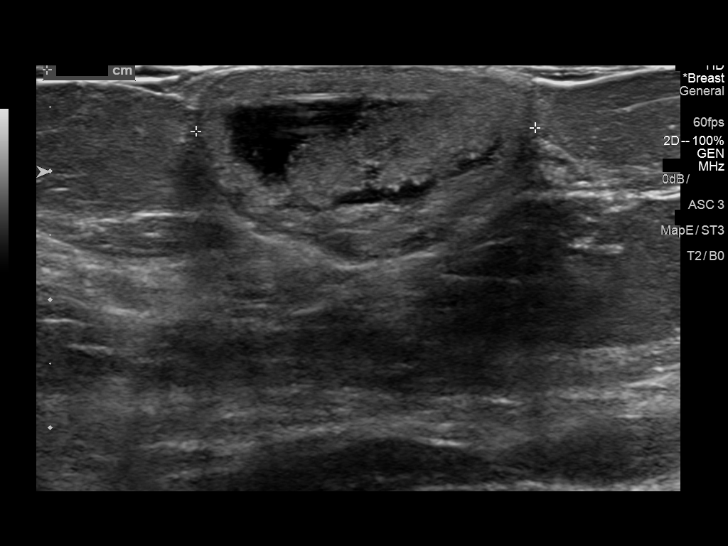
[im 10/10]
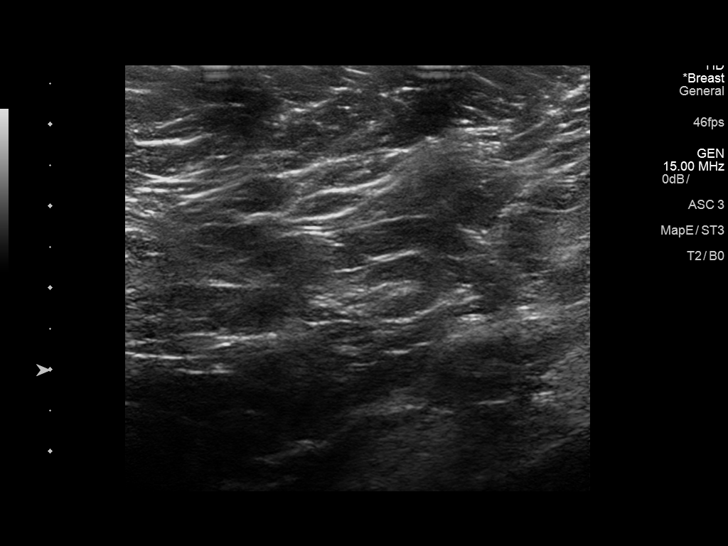

[10 of 10 positions shown; findings below may reference images not displayed]

ACR Breast Density Category b: There are scattered areas of
fibroglandular density.
FINDINGS: There are no suspicious masses, areas of architectural distortion or
microcalcifications in either breast. Areas of calcified fat
necrosis are seen bilaterally. There is a persistent circumscribed
fat containing mass in the left 11 o'clock breast, anterior depth,
which is stable mammographically.

Mammographic images were processed with CAD.

On physical exam, there is a soft palpable mass in the left 11
o'clock breast, anterior depth, with associated skin dark
discoloration.

Targeted ultrasound is performed, showing circumscribed hyperechoic
mass with hypoechoic cystic spaces located superficially under the
skin and horizontally oriented. It measures 2.6 by 2.8 by 1.5 cm,
which represents a slight decrease in size when compared to the
prior ultrasounds. The findings are still consistent with breast
hematoma/formation of fat necrosis.
IMPRESSION: Slight decrease in size in the left breast 11 o'clock mass, which
likely corresponds to a breast hematoma/formation of fat necrosis.

No other suspicious findings within either breast.

RECOMMENDATION:
Diagnostic mammogram and possibly ultrasound of the left breast in 6
months. (Code:VU-[id number])

I have discussed the findings and recommendations with the patient.
Results were also provided in writing at the conclusion of the
visit. If applicable, a reminder letter will be sent to the patient
regarding the next appointment.

BI-RADS CATEGORY  3: Probably benign.

## 2016-09-07 ENCOUNTER — Other Ambulatory Visit: Payer: Self-pay | Admitting: Family Medicine

## 2016-09-07 DIAGNOSIS — Z1231 Encounter for screening mammogram for malignant neoplasm of breast: Secondary | ICD-10-CM

## 2016-09-17 ENCOUNTER — Ambulatory Visit
Admission: RE | Admit: 2016-09-17 | Discharge: 2016-09-17 | Disposition: A | Discharge status: Home / Self Care | Payer: Medicare PPO | Source: Ambulatory Visit | Attending: Family Medicine | Admitting: Family Medicine

## 2016-09-17 DIAGNOSIS — Z1231 Encounter for screening mammogram for malignant neoplasm of breast: Secondary | ICD-10-CM

## 2016-11-03 NOTE — Progress Notes (Deleted)
Cardiology Office Note  Date:  11/03/2016   ID:  Toni Marshall, DOB 05/04/1945, MRN [id number]  PCP:  Emogene Morgan, MD   No chief complaint on file.   HPI:  Toni Marshall is a very pleasant 71 year old woman with history of seen at the [facility]. coronary artery disease,  bypass surgery in 2002,  repeat bypass November 2012 at [facility],  diabetes,  hypertension,  hyperlipidemia,  obesity with non-STEMI 10/29/2011 with presentation to [facility].  Her previous anginal symptoms included shortness of breath, possibly indigestion Long history of smoking, 16 years of working in a [employer] needs 3 L oxygen,  She presents today for follow-up of her chronic diastolic CHF  In follow-up she reports that she is doing well, Did not go to CHF clinic Nurse from [place] will call her, giving her info what to do Weight down 6 to 8 pounds She continues to take Lasix every other day Periodically eats out ("I have a taste for barbecue")  does not have to take extra Lasix for leg edema,   She does have shortness of breath, has not been taking extra Lasix No exercise continues to use 2 L of oxygen at all times. No significant chest pain concerning for angina Doing some rubber bands but no regular exercise program  Lab work reviewed with her today in detail HBA1C 6.6 up to 7.2 up to 7.5 CR 1.27,  Total chol 110, LDL57  EKG on today's visit shows normal sinus rhythm with rate 67 bpm,nonspecific ST and T wave abnormality  Other past medical history admitted February 25 with discharge 04/23/2014. Admitted with shortness of breath. No significant improvement in symptoms with diuresis, climb in her creatinine and BUN. 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  Echocardiogram dated 04/05/2014 shows ejection fraction of 60-65%, diastolic relaxation abnormality, otherwise a normal  study Cardiac stress test 04/06/2014 for atypical chest pain showed severe scar in the anterior and apical territory, ejection fraction 50%  hospitalization 04/05/2014 for chest pain. She ruled out for MI, had stress test showing no significant ischemia. Significant artifact noted. Symptoms were felt to be somewhat atypical in nature  Typically total cholesterol 130 Previous long complicated hospital course with acute respiratory failure, bibasilar pneumonia. She is on a ventilator and required prolonged resuscitation.  Catheterization in 10/30/2011 showed severe three-vessel coronary artery disease, patent vein graft to the RCA, occluded vein graft to the OM and LIMA to the LAD. Attempted LAD PCI which was unsuccessful. Left main is moderately calcified, 99% mid LAD followed by a 90% lesion, 50% distal LAD disease, diagonal #2 had 90% ostial disease, 90% mid circumflex, proximal 60% RCA disease, LIMA graft with 100% stenosis at the graft ostium, saphenous vein graft to the OM with 100% ostial graft disease, vein graft to the distal RCA showing 30% proximal graft disease, 40% mid graft disease, ejection fraction 40%  Previous Echocardiogram showing ejection fraction 40-45%, apical akinesis with severe distal anterior and distal inferior wall hypokinesis, mild MR echocardiogram in the hospital 09/16/2012 showed ejection fraction 50-55%, otherwise normal study  PMH:   has a past medical history of CAD (coronary artery disease); Cardiogenic shock (HCC); CHF (congestive heart failure) (HCC); Chronic kidney disease; COPD (chronic obstructive pulmonary disease) (HCC); Diabetes mellitus; Glaucoma; H/O acute respiratory failure; Hyperlipidemia; Hypertension; NSTEMI (non-ST elevated myocardial infarction) (HCC); and Rheumatoid arthritis(714.0).  PSH:    Past Surgical  History:  Procedure Laterality Date  . CARDIAC CATHETERIZATION     x2 [facility]  . CARDIAC CATHETERIZATION     [facility]  . COLONOSCOPY    .  CORONARY ARTERY BYPASS GRAFT  2012  . CORONARY ARTERY BYPASS GRAFT  2002  . EYE SURGERY     left    Current Outpatient Prescriptions  Medication Sig Dispense Refill  . aspirin EC 81 MG tablet Take 81 mg by mouth daily.    Marland Kitchen atorvastatin (LIPITOR) 80 MG tablet TAKE 1 TABLET (80 MG TOTAL) BY MOUTH DAILY. 90 tablet 3  . bimatoprost (LUMIGAN) 0.01 % SOLN Place 1 drop into the left eye at bedtime.    Marland Kitchen CALCIUM PLUS VITAMIN D3 600-500 MG-UNIT CAPS Take 1 tablet by mouth 2 (two) times daily.     . carvedilol (COREG) 3.125 MG tablet Take 1 tablet (3.125 mg total) by mouth 2 (two) times daily with a meal. 28 tablet 1  . clopidogrel (PLAVIX) 75 MG tablet Take 1 tablet (75 mg total) by mouth daily. 14 tablet 1  . docusate sodium (COLACE) 100 MG capsule Take 100 mg by mouth daily as needed for mild constipation.    . folic acid (FOLVITE) 400 MCG tablet Take 800 mcg by mouth daily.     . furosemide (LASIX) 20 MG tablet Take 1 tablet (20 mg total) by mouth daily as needed. 90 tablet 3  . glipiZIDE (GLUCOTROL XL) 2.5 MG 24 hr tablet Take 2.5 mg by mouth daily with breakfast.    . isosorbide mononitrate (IMDUR) 30 MG 24 hr tablet TAKE 1 TABLET BY MOUTH EVERY DAY 90 tablet 3  . KLOR-CON 10 10 MEQ tablet Take 10 mEq by mouth See admin instructions. Take 1 tablet by mouth 2 times a day every other day with Furosemide.  5  . lisinopril (PRINIVIL,ZESTRIL) 5 MG tablet Take 1 tablet (5 mg total) by mouth daily. 14 tablet 1  . magnesium oxide (MAG-OX) 400 MG tablet Take 400 mg by mouth 2 (two) times daily.     . naproxen (NAPROSYN) 500 MG tablet Take 500 mg by mouth 2 (two) times daily as needed.    . OXYGEN Inhale 3 L/min into the lungs continuous.     No current facility-administered medications for this visit.      Allergies:   Vancomycin   Social History:  The patient  reports that she has quit smoking. Her smoking use included Cigarettes. She has a 17.50 pack-year smoking history. She has never used  smokeless tobacco. She reports that she does not drink alcohol or use drugs.   Family History:   family history includes Diabetes in her father; Heart attack in her mother; Hypertension in her father; Stroke in her sister.    Review of Systems: Review of Systems  Constitutional: Negative.   Respiratory: Positive for shortness of breath.   Cardiovascular: Negative.   Gastrointestinal: Negative.   Musculoskeletal: Negative.   Neurological: Negative.   Psychiatric/Behavioral: Negative.   All other systems reviewed and are negative.    PHYSICAL EXAM: VS:  LMP  (LMP Unknown)  , BMI There is no height or weight on file to calculate BMI. GEN: Well nourished, well developed, in no acute distress , obese HEENT: normal  Neck: no JVD, carotid bruits, or masses Cardiac: RRR; no murmurs, rubs, or gallops,no edema  Respiratory:  clear to auscultation bilaterally, normal work of breathing GI: soft, nontender, nondistended, + BS MS: no deformity or atrophy  Skin: warm  and dry, no rash Neuro:  Strength and sensation are intact Psych: euthymic mood, full affect    Recent Labs: No results found for requested labs within last 8760 hours.    Lipid Panel Lab Results  Component Value Date   CHOL 110 09/17/2012   HDL 47 09/17/2012   LDLCALC 49 09/17/2012   TRIG 69 09/17/2012      Wt Readings from Last 3 Encounters:  05/08/16 212 lb 8 oz (96.4 kg)  11/08/15 220 lb (99.8 kg)  10/15/15 218 lb (98.9 kg)       ASSESSMENT AND PLAN:  Coronary artery disease involving coronary bypass graft of native heart without angina pectoris - Plan: EKG 12-Lead Currently with no symptoms of angina. No further workup at this time. Continue current medication regimen.  Mixed hyperlipidemia - Plan: EKG 12-Lead Cholesterol is at goal on the current lipid regimen. No changes to the medications were made.  Chronic diastolic CHF (congestive heart failure) (HCC) - Plan: EKG 12-Lead Appears relatively  euvolemic though recommended she take extra Lasix every time she eats out at a restaurant. Otherwise continue every other day Also take extra Lasix for worsening shortness of breath on exertion Weight is down 6-8 pounds   Essential hypertension - Plan: EKG 12-Lead Blood pressure is well controlled on today's visit. No changes made to the medications.  Type 2 diabetes mellitus with other circulatory complication, without long-term current use of insulin (HCC) - Plan: EKG 12-Lead Hemoglobin A1c trending upwards, long discussion about her diet and dietary changes needed, further weight loss   Simple chronic bronchitis (HCC) - Plan: EKG 12-Lead Lungs relatively clear, no recent exacerbation   ILD (interstitial lung disease) (HCC) - Plan: EKG 12-Lead Reports oxygenation requirements are stable, on nasal cannula oxygen   Total encounter time more than 25 minutes  Greater than 50% was spent in counseling and coordination of care with the patient   Disposition:   F/U  6 months   No orders of the defined types were placed in this encounter.    Signed, Dossie Arbour, M.D., Ph.D. 11/03/2016  [facility] [place], [place] [phone]

## 2016-11-04 ENCOUNTER — Ambulatory Visit: Payer: Medicare PPO | Admitting: Cardiovascular Disease

## 2016-12-07 NOTE — Progress Notes (Signed)
Cardiology Office Note  Date:  12/08/2016   ID:  Toni Marshall, DOB Mar 17, 1945, MRN [id number]  PCP:  Emogene Morgan, MD   Chief Complaint  Patient presents with  . other    6 month follow up. Patient c/o SOB at times. Meds reviewed verbally with patient.     HPI:  Toni Marshall is a very pleasant 71 year old woman with history of  coronary artery disease,  bypass surgery in 2002, repeat bypass November 2012 at [facility],  diabetes,  hypertension,  Hyperlipidemia, obesity  non-STEMI 10/29/2011 with presentation to [facility].  Her previous anginal symptoms included shortness of breath, possibly indigestion Long history of smoking, 16 years of working in a 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  No significant chest pain concerning for angina Doing some rubber bands but no regular exercise program  Lab work reviewed with her today in detail HBA1C previously 7.28 January 2016 CR 1.27,  Total chol 110, LDL57  EKG on today's visit shows normal sinus rhythm with rate 68 bpm,no significant ST or T-wave changes  Other past medical history admitted February 25 with discharge 04/23/2014. Admitted with shortness of breath. No significant improvement in symptoms with diuresis, climb in her creatinine and BUN. 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  Echocardiogram dated 04/05/2014 shows ejection fraction of 60-65%, diastolic relaxation abnormality, otherwise a normal  study Cardiac stress test 04/06/2014 for atypical chest pain showed severe scar in the anterior and apical territory, ejection fraction 50%  hospitalization 04/05/2014 for chest pain. She ruled out for MI, had stress test showing no significant ischemia. Significant artifact noted. Symptoms were felt to be somewhat atypical in nature  Typically total cholesterol 130 Previous long complicated hospital course with acute respiratory failure, bibasilar pneumonia. She is on a ventilator and required prolonged resuscitation.  Catheterization in 10/30/2011 showed severe three-vessel coronary artery disease, patent vein graft to the RCA, occluded vein graft to the OM and LIMA to the LAD. Attempted LAD PCI which was unsuccessful. Left main is moderately calcified, 99% mid LAD followed by a 90% lesion, 50% distal LAD disease, diagonal #2 had 90% ostial disease, 90% mid circumflex, proximal 60% RCA disease, LIMA graft with 100% stenosis at the graft ostium, saphenous vein graft to the OM with 100% ostial graft disease, vein graft to the distal RCA showing 30% proximal graft disease, 40% mid graft disease, ejection fraction 40%  Previous Echocardiogram showing ejection fraction 40-45%, apical akinesis with severe distal anterior and distal inferior wall hypokinesis, mild MR echocardiogram in the hospital 09/16/2012 showed ejection fraction 50-55%, otherwise normal study  PMH:   has a past medical history of CAD (coronary artery disease); Cardiogenic shock (HCC); CHF (congestive heart failure) (HCC); Chronic kidney disease; COPD (chronic obstructive pulmonary disease) (HCC); Diabetes mellitus; Glaucoma; H/O acute respiratory failure; Hyperlipidemia; Hypertension; NSTEMI (non-ST elevated myocardial infarction) (HCC); and Rheumatoid arthritis(714.0).  PSH:    Past Surgical History:  Procedure Laterality Date  .  CARDIAC CATHETERIZATION     x2 [facility]  . CARDIAC CATHETERIZATION     [facility]  . COLONOSCOPY    .  CORONARY ARTERY BYPASS GRAFT  2012  . CORONARY ARTERY BYPASS GRAFT  2002  . EYE SURGERY     left    Current Outpatient Prescriptions  Medication Sig Dispense Refill  . aspirin EC 81 MG tablet Take 81 mg by mouth daily.    Marland Kitchen atorvastatin (LIPITOR) 80 MG tablet TAKE 1 TABLET (80 MG TOTAL) BY MOUTH DAILY. 90 tablet 3  . bimatoprost (LUMIGAN) 0.01 % SOLN Place 1 drop into the left eye at bedtime.    Marland Kitchen CALCIUM PLUS VITAMIN D3 600-500 MG-UNIT CAPS Take 1 tablet by mouth 2 (two) times daily.     . carvedilol (COREG) 3.125 MG tablet Take 1 tablet (3.125 mg total) by mouth 2 (two) times daily with a meal. 28 tablet 1  . clopidogrel (PLAVIX) 75 MG tablet Take 1 tablet (75 mg total) by mouth daily. 14 tablet 1  . folic acid (FOLVITE) 400 MCG tablet Take 800 mcg by mouth daily.     . furosemide (LASIX) 20 MG tablet Take 1 tablet (20 mg total) by mouth daily as needed. 90 tablet 3  . glipiZIDE (GLUCOTROL XL) 2.5 MG 24 hr tablet Take 2.5 mg by mouth daily with breakfast.    . isosorbide mononitrate (IMDUR) 30 MG 24 hr tablet TAKE 1 TABLET BY MOUTH EVERY DAY 90 tablet 3  . KLOR-CON 10 10 MEQ tablet Take 10 mEq by mouth See admin instructions. Take 1 tablet by mouth 2 times a day every other day with Furosemide.  5  . lisinopril (PRINIVIL,ZESTRIL) 5 MG tablet Take 1 tablet (5 mg total) by mouth daily. 14 tablet 1  . magnesium oxide (MAG-OX) 400 MG tablet Take 400 mg by mouth 2 (two) times daily.     . OXYGEN Inhale 3 L/min into the lungs continuous.     No current facility-administered medications for this visit.      Allergies:   Vancomycin   Social History:  The patient  reports that she has quit smoking. Her smoking use included Cigarettes. She has a 17.50 pack-year smoking history. She has never used smokeless tobacco. She reports that she does not drink alcohol or use drugs.   Family History:   family history includes Diabetes in her father; Heart attack in her mother; Hypertension in her father;  Stroke in her sister.    Review of Systems: Review of Systems  Constitutional: Negative.   Respiratory: Positive for cough.   Cardiovascular: Negative.   Gastrointestinal: Negative.   Musculoskeletal: Negative.   Neurological: Negative.   Psychiatric/Behavioral: Negative.   All other systems reviewed and are negative.    PHYSICAL EXAM: VS:  BP 114/64 (BP Location: Left Arm, Patient Position: Sitting, Cuff Size: Large)   Pulse 68   Ht 5\' 1"  (1.549 m)   Wt 205 lb 8 oz (93.2 kg)   LMP  (LMP Unknown)   BMI 38.83 kg/m  , BMI Body mass index is 38.83 kg/m. GEN: Well nourished, well developed, in no acute distress , obese, on oxygen nasal cannula HEENT: normal  Neck: no JVD, carotid bruits, or masses Cardiac: RRR; no murmurs, rubs, or gallops,no edema  Respiratory:  clear to auscultation bilaterally, normal work of breathing GI: soft, nontender, nondistended, + BS MS: no deformity or atrophy  Skin: warm and dry, no rash Neuro:  Strength and sensation are intact Psych:  euthymic mood, full affect    Recent Labs: No results found for requested labs within last 8760 hours.    Lipid Panel Lab Results  Component Value Date   CHOL 110 09/17/2012   HDL 47 09/17/2012   LDLCALC 49 09/17/2012   TRIG 69 09/17/2012      Wt Readings from Last 3 Encounters:  12/08/16 205 lb 8 oz (93.2 kg)  05/08/16 212 lb 8 oz (96.4 kg)  11/08/15 220 lb (99.8 kg)       ASSESSMENT AND PLAN:  Coronary artery disease involving coronary bypass graft of native heart without angina pectoris - Plan: EKG 12-Lead No anginal symptoms, no further testing at this time Stressed importance of aggressive diabetes control  Mixed hyperlipidemia - Plan: EKG 12-Lead Cholesterol is at goal on the current lipid regimen. No changes to the medications were made. Stable  Chronic diastolic CHF (congestive heart failure) (HCC) - Plan: EKG 12-Lead Appears relatively euvolemic Currently taking extra Lasix for  chest congestion ( secondary to URI) Otherwise takes Lasix every other day  on ACE inhibitor, Coreg  Essential hypertension - Plan: EKG 12-Lead Blood pressure is well controlled on today's visit. No changes made to the medications. stable phone Type 2 diabetes mellitus with other circulatory complication, without long-term current use of insulin (HCC) - Plan: EKG 12-Lead Dramatic weight loss in the past year, hemoglobin A1c will likely improve  Simple chronic bronchitis (HCC) - Plan: EKG 12-Lead Lungs relatively clear,coughing over the past 3 days Likely viral though recommended she follow-up with primary care and if symptoms get worse in one week may need antibiotics  ILD (interstitial lung disease) (HCC) - Plan: EKG 12-Lead Reports oxygenation requirements are stable, on nasal cannula oxygen On 3 L, stable   Total encounter time more than 25 minutes  Greater than 50% was spent in counseling and coordination of care with the patient   Disposition:   F/U  6 months   Orders Placed This Encounter  Procedures  . EKG 12-Lead     Signed, Dossie Arbour, M.D., Ph.D. 12/08/2016  [facility] [place], [place] [phone]

## 2016-12-08 ENCOUNTER — Encounter: Payer: Self-pay | Admitting: Cardiovascular Disease

## 2016-12-08 ENCOUNTER — Ambulatory Visit (INDEPENDENT_AMBULATORY_CARE_PROVIDER_SITE_OTHER): Payer: Medicare PPO | Admitting: Cardiovascular Disease

## 2016-12-08 VITALS — BP 114/64 | HR 68 | Ht 61.0 in | Wt 205.5 lb

## 2016-12-08 DIAGNOSIS — I25708 Atherosclerosis of coronary artery bypass graft(s), unspecified, with other forms of angina pectoris: Secondary | ICD-10-CM

## 2016-12-08 DIAGNOSIS — I5032 Chronic diastolic (congestive) heart failure: Secondary | ICD-10-CM

## 2016-12-08 DIAGNOSIS — E782 Mixed hyperlipidemia: Secondary | ICD-10-CM

## 2016-12-08 DIAGNOSIS — J432 Centrilobular emphysema: Secondary | ICD-10-CM

## 2016-12-08 DIAGNOSIS — J849 Interstitial pulmonary disease, unspecified: Secondary | ICD-10-CM | POA: Diagnosis not present

## 2016-12-08 DIAGNOSIS — E1159 Type 2 diabetes mellitus with other circulatory complications: Secondary | ICD-10-CM

## 2016-12-08 DIAGNOSIS — I10 Essential (primary) hypertension: Secondary | ICD-10-CM

## 2016-12-08 NOTE — Patient Instructions (Signed)

## 2017-03-16 ENCOUNTER — Telehealth: Payer: Self-pay | Admitting: *Deleted

## 2017-03-16 NOTE — Telephone Encounter (Signed)
Received referral for initial lung cancer screening scan. Contacted patient and obtained smoking history,which includes quit date > 15 years ago. Explained to patient that she does not fall into "high risk" category for eligibility of lung screening scan.

## 2017-03-23 ENCOUNTER — Encounter: Payer: Self-pay | Admitting: Nurse Practitioner

## 2017-03-23 ENCOUNTER — Ambulatory Visit: Payer: Medicare PPO | Admitting: Nurse Practitioner

## 2017-03-23 ENCOUNTER — Encounter: Payer: Self-pay | Admitting: Internal Medicine

## 2017-03-23 ENCOUNTER — Ambulatory Visit: Payer: Medicare PPO | Admitting: Internal Medicine

## 2017-03-23 ENCOUNTER — Ambulatory Visit: Admission: RE | Admit: 2017-03-23 | Payer: Medicare PPO | Source: Ambulatory Visit | Admitting: Internal Medicine

## 2017-03-23 ENCOUNTER — Ambulatory Visit
Admission: RE | Admit: 2017-03-23 | Discharge: 2017-03-23 | Disposition: A | Discharge status: Home / Self Care | Payer: Medicare PPO | Source: Ambulatory Visit | Attending: Internal Medicine | Admitting: Internal Medicine

## 2017-03-23 VITALS — BP 122/90 | HR 66 | Ht 61.0 in | Wt 204.8 lb

## 2017-03-23 VITALS — BP 110/70 | HR 80 | Ht 61.0 in | Wt 202.0 lb

## 2017-03-23 DIAGNOSIS — I25119 Atherosclerotic heart disease of native coronary artery with unspecified angina pectoris: Secondary | ICD-10-CM | POA: Diagnosis not present

## 2017-03-23 DIAGNOSIS — I10 Essential (primary) hypertension: Secondary | ICD-10-CM | POA: Diagnosis not present

## 2017-03-23 DIAGNOSIS — J449 Chronic obstructive pulmonary disease, unspecified: Secondary | ICD-10-CM | POA: Insufficient documentation

## 2017-03-23 DIAGNOSIS — I5032 Chronic diastolic (congestive) heart failure: Secondary | ICD-10-CM

## 2017-03-23 DIAGNOSIS — E782 Mixed hyperlipidemia: Secondary | ICD-10-CM | POA: Diagnosis not present

## 2017-03-23 DIAGNOSIS — I517 Cardiomegaly: Secondary | ICD-10-CM | POA: Diagnosis not present

## 2017-03-23 MED ORDER — TIOTROPIUM BROMIDE MONOHYDRATE 18 MCG IN CAPS: 18.0000 ug | ORAL_CAPSULE | Freq: Every day | RESPIRATORY_TRACT | 6 refills | Status: AC

## 2017-03-23 NOTE — Progress Notes (Signed)
Office Visit    Patient Name: Toni Marshall Date of Encounter: 03/23/2017  Primary Care Provider:  Emogene Morgan, MD Primary Cardiologist:  Julien Nordmann, MD  Chief Complaint    72 year old female with a prior history of CAD status post CABG and redo CABG, diastolic heart failure, stage III kidney disease, COPD on home O2, glaucoma, hypertension, hyperlipidemia, obesity, rheumatoid arthritis, and type 2 diabetes mellitus, who presents for follow-up with a one-month history of worsening dyspnea.  Past Medical History    Past Medical History:  Diagnosis Date  . CAD (coronary artery disease)    a. 2002 s/p CABG; b. 2012 s/p redo CABG; c. 10/2011 Cath: LM Ca2+, LAD 99/52m, 50d, D2 90ost, LCX 54m, RCA 60p, LIMA->LAD 100ost, VG->OM 100ost, VG->dRCA 30p, 57m, EF 40%-->Med Rx; d. 03/2014 MV: large area of anterior/apical scar w/o ischemia (GI uptake noted), EF 50%->Med Rx.  . Cardiogenic shock (HCC)   . Chronic diastolic CHF (congestive heart failure) (HCC)    a. 2014 Echo: EF 50-55%, nl RV size/fxn, nl RVSP; b. 2016 Echo: Ef 60-65%, DD, nl RVSP.  Marland Kitchen CKD (chronic kidney disease), stage III (HCC)   . COPD (chronic obstructive pulmonary disease) (HCC)    a. Home O2 @ 3lpm.  . Glaucoma   . Hyperlipidemia   . Hypertension   . Morbid obesity (HCC)   . Rheumatoid arthritis(714.0)   . Type II diabetes mellitus (HCC)    Past Surgical History:  Procedure Laterality Date  . CARDIAC CATHETERIZATION     x2 [facility]  . CARDIAC CATHETERIZATION     [facility]  . COLONOSCOPY    . CORONARY ARTERY BYPASS GRAFT  2012  . CORONARY ARTERY BYPASS GRAFT  2002  . EYE SURGERY     left    Allergies  Allergies  Allergen Reactions  . Vancomycin     Hypotension & hypoxia     History of Present Illness    72 year old female with the above complex past medical history including CAD status post CABG in 2002 with redo CABG in 2012.  Catheterization in September 2013 showed severe multivessel native coronary  artery disease with occlusions to the LIMA to the LAD and vein graft to the obtuse marginal.  Vein graft to the right coronary artery was patent.  EF was 40% at that time but subsequent echocardiograms have shown improved LV function, with an EF of 60-65% by echo in 2016.  Other history includes COPD on chronic home O2 at 3 L/min.  She does have chronic dyspnea on exertion in that setting.  She also has a history of hypertension, hyperlipidemia, obesity, diabetes, rheumatoid arthritis, glaucoma, and stage III chronic kidney disease.  As noted, she has chronic dyspnea on exertion but over the past month, this has been more pronounced.  She can only walk short distances prior to experience and dyspnea.  She says that in November, she had an episode of chest discomfort that "felt like a heart attack" but resolved spontaneously and has not recurred in a similar fashion since then.  Since then, she sometimes will get a discomfort in her chest but there is no real rhyme or reason to it and it does not typically last more than a few seconds to a minute.  Her weight has been stable at home and she has not been experience in PND, orthopnea, dizziness, syncope, edema, or early satiety.  She frequently will take an additional dose of Lasix when she is experiencing dyspnea even  if her weight is the same.  She will also frequently increase her oxygen from 3 L to 3.5 or 4 L/min via nasal cannula.  She says she is accustomed to having oxygen saturations in the 80s and will sometimes have to shut off her oxygen if she is running errands so that she does not run out of oxygen.  Home Medications    Prior to Admission medications   Medication Sig Start Date End Date Taking? Authorizing Provider  aspirin EC 81 MG tablet Take 81 mg by mouth daily.   Yes [provider]  atorvastatin (LIPITOR) 80 MG tablet TAKE 1 TABLET (80 MG TOTAL) BY MOUTH DAILY. 12/09/15  Yes Gollan, Tollie Pizza, MD  bimatoprost (LUMIGAN) 0.01 % SOLN  Place 1 drop into the left eye at bedtime.   Yes [provider]  brimonidine (ALPHAGAN) 0.2 % ophthalmic solution Place into both eyes 2 (two) times daily. 02/26/17  Yes [provider]  CALCIUM PLUS VITAMIN D3 600-500 MG-UNIT CAPS Take 1 tablet by mouth 2 (two) times daily.  04/10/14  Yes [provider]  carvedilol (COREG) 3.125 MG tablet Take 1 tablet (3.125 mg total) by mouth 2 (two) times daily with a meal. 04/24/16  Yes Gollan, Tollie Pizza, MD  clopidogrel (PLAVIX) 75 MG tablet Take 1 tablet (75 mg total) by mouth daily. 04/24/16  Yes Antonieta Iba, MD  folic acid (FOLVITE) 400 MCG tablet Take 800 mcg by mouth daily.    Yes [provider]  furosemide (LASIX) 20 MG tablet Take 1 tablet (20 mg total) by mouth daily as needed. 05/08/16  Yes Gollan, Tollie Pizza, MD  glipiZIDE (GLUCOTROL XL) 2.5 MG 24 hr tablet Take 2.5 mg by mouth daily with breakfast.   Yes [provider]  isosorbide mononitrate (IMDUR) 30 MG 24 hr tablet TAKE 1 TABLET BY MOUTH EVERY DAY 10/29/15  Yes Gollan, Tollie Pizza, MD  KLOR-CON 10 10 MEQ tablet Take 10 mEq by mouth See admin instructions. Take 1 tablet by mouth 2 times a day every other day with Furosemide. 04/25/15  Yes [provider]  lisinopril (PRINIVIL,ZESTRIL) 5 MG tablet Take 1 tablet (5 mg total) by mouth daily. 04/24/16  Yes Antonieta Iba, MD  magnesium oxide (MAG-OX) 400 MG tablet Take 400 mg by mouth 2 (two) times daily.    Yes [provider]  OXYGEN Inhale 3 L/min into the lungs continuous.   Yes [provider]  tiotropium (SPIRIVA) 18 MCG inhalation capsule Place 1 capsule (18 mcg total) into inhaler and inhale daily. 03/23/17   Shane Crutch, MD    Review of Systems    Increasing dyspnea on exertion over the past month as noted above.  Intermittent, brief episodes of chest discomfort.  She denies PND, palpitations, orthopnea, dizziness, syncope, edema, or early satiety.  All other  systems reviewed and are otherwise negative except as noted above.  Physical Exam    VS:  BP 122/90 (BP Location: Left Arm, Patient Position: Sitting, Cuff Size: Normal)   Pulse 66   Ht 5\' 1"  (1.549 m)   Wt 204 lb 12 oz (92.9 kg)   LMP  (LMP Unknown)   BMI 38.69 kg/m  , BMI Body mass index is 38.69 kg/m. GEN: Obese, in no acute distress.  HEENT: normal.  Neck: Supple, no JVD, carotid bruits, or masses. Cardiac: RRR, no murmurs, rubs, or gallops. No clubbing, cyanosis, edema.  Radials/DP/PT 2+ and equal bilaterally.  Respiratory:  Respirations regular  and unlabored, clear to auscultation bilaterally. GI: Soft, nontender, nondistended, BS + x 4. MS: no deformity or atrophy. Skin: warm and dry, no rash. Neuro:  Strength and sensation are intact. Psych: Normal affect.  Accessory Clinical Findings    ECG -regular sinus rhythm, 66, anterolateral T wave inversion  Assessment & Plan    1.  Chronic diastolic congestive heart failure/dyspnea on exertion: Patient is chronic dyspnea on exertion and uses home O2.  Over the past month, she has had more pronounced dyspnea with less and less activity.  Her weight is been stable at home and she is euvolemic on exam today.  I will follow-up an echocardiogram to reassess LV function.  If LV function is depressed, I would have a low threshold to pursue an ischemic evaluation.  Heart rate and blood pressure stable today.  No changes to medications.  I will check a CBC and basic metabolic panel.  2.  Coronary artery disease: Status post prior CABG and redo CABG.  Currently, she has nonobstructive disease in the right coronary artery with a patent vein graft to the right coronary artery.  The LIMA to the LAD and vein graft to the obtuse marginal are down.  She has severe disease in the LAD and native circumflex.  As noted above, she did have an episode of chest discomfort in November, which did not to her as being more significant than usual.  She has had  mild intermittent symptoms since then.  As above, I will start with an echocardiogram to assess her LV function.  I will plan to see her back and reassess symptoms following echo and consider stress testing at that point.  Continue aspirin, statin, beta-blocker, Plavix, and nitrate therapy.  3.  Essential hypertension: Stable.  Next  4.  Hyperlipidemia: On statin therapy.  This is followed by primary care provider.  5.  COPD/chronic rotatory failure: She is on home O2 and has been having increased oxygen requirements.  She has follow-up with pulmonology today.  I am checking labs and echo in the setting of increasing dyspnea.  6.  Disposition: Follow-up echocardiogram and labs.  Follow-up within the next 3-4 weeks.  Nicolasa Ducking, NP 03/23/2017, 4:49 PM

## 2017-03-23 NOTE — Progress Notes (Signed)
[facility] [place] Consultation      Assessment and Plan:  OSA.  --history of known OSA, declined use of OSA, does not want to consider it at this time.  --Pt has dentures, therefore oral device would not be an option.   COPD with chronic bronchitis and cough.  --Will start spiriva.  --Continue albuterol.  --Was previously referred for lung cancer screening, was not a candidate.  --Will check PFT.   Chronic hypoxic respiratory failure.  --Likely mutifactorial.  --Continue oxygen at 2L.   Interstitial lung disease.  --Previous CXR in 2017 suggested ILD, and pt has history of RA.  --Will check CXR and PFT.   Orders Placed This Encounter  Procedures  . DG Chest 2 View  . Pulmonary Function Test [facility] Only   Meds ordered this encounter  Medications  . tiotropium (SPIRIVA) 18 MCG inhalation capsule    Sig: Place 1 capsule (18 mcg total) into inhaler and inhale daily.    Dispense:  30 capsule    Refill:  6   Return in about 3 months (around 06/21/2017).   Date: 03/23/2017  MRN# [id number] Toni Marshall [birth year]    Toni Marshall is a 72 y.o. old female seen in consultation for chief complaint of:    Chief Complaint  Patient presents with  . Advice Only    self ref for cough: prod cough w/white mucus; SOB w/activity: chest tightness;     HPI:   Patient was recently seen at the [facility] clinic, at that time it was noted that her oxygen saturation was 79% on 2 L nasal cannula. She has been coughing for about a month, she got a proair inhaler, she does not think that it has helped. She smoked half ppd until 15 years ago when she had a heart attack.  She has been diagnosed with COPD, she was in the hospital in 2017 for heart failure, ILD.  She lives in a mobile home by herself, she does her own housework, she is mildly limited by breathing, but she has to take her time, such as when taking a bath. Sometimes she has to stop and rest when doing things.  She  is on oxygen at 2L, she has a diagnosis of RA. She used to work in a [employer].  She was diagnosed with OSA but was told her CPAP was not covered.   Images personally reviewed; CXR 05/26/15; showed interstitial edema.    PMHX:   Past Medical History:  Diagnosis Date  . CAD (coronary artery disease)   . Cardiogenic shock (HCC)   . CHF (congestive heart failure) (HCC)    chronic systolic dysfunction with EF of 45%  . Chronic kidney disease   . COPD (chronic obstructive pulmonary disease) (HCC)   . Diabetes mellitus    Type II  . Glaucoma   . H/O acute respiratory failure   . Hyperlipidemia   . Hypertension   . NSTEMI (non-ST elevated myocardial infarction) (HCC)   . Rheumatoid arthritis(714.0)    Surgical Hx:  Past Surgical History:  Procedure Laterality Date  . CARDIAC CATHETERIZATION     x2 [facility]  . CARDIAC CATHETERIZATION     [facility]  . COLONOSCOPY    . CORONARY ARTERY BYPASS GRAFT  2012  . CORONARY ARTERY BYPASS GRAFT  2002  . EYE SURGERY     left   Family Hx:  Family History  Problem Relation Age of Onset  . Heart attack Mother   .  Hypertension Father   . Diabetes Father   . Stroke Sister    Social Hx:   Social History   Tobacco Use  . Smoking status: Former Smoker    Packs/day: 0.50    Years: 35.00    Pack years: 17.50    Types: Cigarettes  . Smokeless tobacco: Never Used  Substance Use Topics  . Alcohol use: No    Alcohol/week: 0.0 oz  . Drug use: No   Medication:    Current Outpatient Medications:  .  aspirin EC 81 MG tablet, Take 81 mg by mouth daily., Disp: , Rfl:  .  atorvastatin (LIPITOR) 80 MG tablet, TAKE 1 TABLET (80 MG TOTAL) BY MOUTH DAILY., Disp: 90 tablet, Rfl: 3 .  bimatoprost (LUMIGAN) 0.01 % SOLN, Place 1 drop into the left eye at bedtime., Disp: , Rfl:  .  brimonidine (ALPHAGAN) 0.2 % ophthalmic solution, Place into both eyes 2 (two) times daily., Disp: , Rfl: 11 .  CALCIUM PLUS VITAMIN D3 600-500 MG-UNIT CAPS, Take 1 tablet by mouth  2 (two) times daily. , Disp: , Rfl:  .  carvedilol (COREG) 3.125 MG tablet, Take 1 tablet (3.125 mg total) by mouth 2 (two) times daily with a meal., Disp: 28 tablet, Rfl: 1 .  clopidogrel (PLAVIX) 75 MG tablet, Take 1 tablet (75 mg total) by mouth daily., Disp: 14 tablet, Rfl: 1 .  folic acid (FOLVITE) 400 MCG tablet, Take 800 mcg by mouth daily. , Disp: , Rfl:  .  furosemide (LASIX) 20 MG tablet, Take 1 tablet (20 mg total) by mouth daily as needed., Disp: 90 tablet, Rfl: 3 .  glipiZIDE (GLUCOTROL XL) 2.5 MG 24 hr tablet, Take 2.5 mg by mouth daily with breakfast., Disp: , Rfl:  .  isosorbide mononitrate (IMDUR) 30 MG 24 hr tablet, TAKE 1 TABLET BY MOUTH EVERY DAY, Disp: 90 tablet, Rfl: 3 .  KLOR-CON 10 10 MEQ tablet, Take 10 mEq by mouth See admin instructions. Take 1 tablet by mouth 2 times a day every other day with Furosemide., Disp: , Rfl: 5 .  lisinopril (PRINIVIL,ZESTRIL) 5 MG tablet, Take 1 tablet (5 mg total) by mouth daily., Disp: 14 tablet, Rfl: 1 .  magnesium oxide (MAG-OX) 400 MG tablet, Take 400 mg by mouth 2 (two) times daily. , Disp: , Rfl:  .  OXYGEN, Inhale 3 L/min into the lungs continuous., Disp: , Rfl:    Allergies:  Vancomycin  Review of Systems: Gen:  Denies  fever, sweats, chills HEENT: Denies blurred vision, double vision. bleeds, sore throat Cvc:  No dizziness, chest pain. Resp:   Denies cough or sputum production. Gi: Denies swallowing difficulty, stomach pain. Gu:  Denies bladder incontinence, burning urine Ext:   No Joint pain, stiffness. Skin: No skin rash,  hives  Endoc:  No polyuria, polydipsia. Psych: No depression, insomnia. Other:  All other systems were reviewed with the patient and were negative other that what is mentioned in the HPI.   Physical Examination:   VS: BP 110/70 (BP Location: Left Arm, Cuff Size: Normal)   Pulse 80   Ht 5\' 1"  (1.549 m)   Wt 202 lb (91.6 kg)   LMP  (LMP Unknown)   SpO2 92%   BMI 38.17 kg/m   General Appearance:  No distress  Neuro:without focal findings,  speech normal,  HEENT: PERRLA, EOM intact.   Pulmonary: normal breath sounds, No wheezing.  CardiovascularNormal S1,S2.  No m/r/g.   Abdomen: Benign, Soft, non-tender. Renal:  No costovertebral tenderness  GU:  No performed at this time. Endoc: No evident thyromegaly, no signs of acromegaly. Skin:   warm, no rashes, no ecchymosis  Extremities: normal, no cyanosis, clubbing.  Other findings:    LABORATORY PANEL:   CBC No results for input(s): WBC, HGB, HCT, PLT in the last 168 hours. ------------------------------------------------------------------------------------------------------------------  Chemistries  No results for input(s): NA, K, CL, CO2, GLUCOSE, BUN, CREATININE, CALCIUM, MG, AST, ALT, ALKPHOS, BILITOT in the last 168 hours.  Invalid input(s): GFRCGP ------------------------------------------------------------------------------------------------------------------  Cardiac Enzymes No results for input(s): TROPONINI in the last 168 hours. ------------------------------------------------------------  RADIOLOGY:  No results found.     Thank  you for the consultation and for allowing [facility] to assist in the care of your patient. Our recommendations are noted above.  Please contact us if we can be of further service.   Wells Guiles, MD.  Board Certified in Internal Medicine, Pulmonary Medicine, Critical Care Medicine, and Sleep Medicine.  [place] [facility] Office Number: [phone]  Santiago Glad, M.D.  Billy Fischer, M.D  03/23/2017

## 2017-03-23 NOTE — Patient Instructions (Signed)
Start spiriva inhaler, continue albuterol as needed.   Will send for CXR, pulmonary function test.

## 2017-03-23 NOTE — Patient Instructions (Signed)
Medication Instructions:  Your physician recommends that you continue on your current medications as directed. Please refer to the Current Medication list given to you today.   Labwork: Your physician recommends that you return for lab work in: TODAY (CBC, BMET).   Testing/Procedures: Your physician has requested that you have an echocardiogram. Echocardiography is a painless test that uses sound waves to create images of your heart. It provides your doctor with information about the size and shape of your heart and how well your heart's chambers and valves are working. This procedure takes approximately one hour. There are no restrictions for this procedure.    Follow-Up: Your physician recommends that you schedule a follow-up appointment in: 2-4 WEEKS WITH DR Mariah Milling OR APP.  If you need a refill on your cardiac medications before your next appointment, please call your pharmacy.    Echocardiogram An echocardiogram, or echocardiography, uses sound waves (ultrasound) to produce an image of your heart. The echocardiogram is simple, painless, obtained within a short period of time, and offers valuable information to your health care provider. The images from an echocardiogram can provide information such as:  Evidence of coronary artery disease (CAD).  Heart size.  Heart muscle function.  Heart valve function.  Aneurysm detection.  Evidence of a past heart attack.  Fluid buildup around the heart.  Heart muscle thickening.  Assess heart valve function.  Tell a health care provider about:  Any allergies you have.  All medicines you are taking, including vitamins, herbs, eye drops, creams, and over-the-counter medicines.  Any problems you or family members have had with anesthetic medicines.  Any blood disorders you have.  Any surgeries you have had.  Any medical conditions you have.  Whether you are pregnant or may be pregnant. What happens before the  procedure? No special preparation is needed. Eat and drink normally. What happens during the procedure?  In order to produce an image of your heart, gel will be applied to your chest and a wand-like tool (transducer) will be moved over your chest. The gel will help transmit the sound waves from the transducer. The sound waves will harmlessly bounce off your heart to allow the heart images to be captured in real-time motion. These images will then be recorded.  You may need an IV to receive a medicine that improves the quality of the pictures. What happens after the procedure? You may return to your normal schedule including diet, activities, and medicines, unless your health care provider tells you otherwise. This information is not intended to replace advice given to you by your health care provider. Make sure you discuss any questions you have with your health care provider. Document Released: 02/07/2000 Document Revised: 09/28/2015 Document Reviewed: 10/17/2012 Elsevier Interactive Patient Education  2017 [employer].

## 2017-03-24 ENCOUNTER — Telehealth: Payer: Self-pay | Admitting: *Deleted

## 2017-03-24 ENCOUNTER — Other Ambulatory Visit: Payer: Self-pay | Admitting: Nurse Practitioner

## 2017-03-24 DIAGNOSIS — I5032 Chronic diastolic (congestive) heart failure: Secondary | ICD-10-CM

## 2017-03-24 LAB — CBC WITH DIFFERENTIAL/PLATELET
Basophils Absolute: 0 10*3/uL (ref 0.0–0.2)
Basos: 0 %
EOS (ABSOLUTE): 0.2 10*3/uL (ref 0.0–0.4)
EOS: 3 %
HEMATOCRIT: 48.7 % — AB (ref 34.0–46.6)
Hemoglobin: 16 g/dL — ABNORMAL HIGH (ref 11.1–15.9)
Immature Grans (Abs): 0 10*3/uL (ref 0.0–0.1)
Immature Granulocytes: 0 %
LYMPHS ABS: 1.8 10*3/uL (ref 0.7–3.1)
Lymphs: 31 %
MCH: 27.9 pg (ref 26.6–33.0)
MCHC: 32.9 g/dL (ref 31.5–35.7)
MCV: 85 fL (ref 79–97)
MONOS ABS: 0.5 10*3/uL (ref 0.1–0.9)
Monocytes: 9 %
Neutrophils Absolute: 3.3 10*3/uL (ref 1.4–7.0)
Neutrophils: 57 %
Platelets: 176 10*3/uL (ref 150–379)
RBC: 5.74 x10E6/uL — AB (ref 3.77–5.28)
RDW: 15.3 % (ref 12.3–15.4)
WBC: 5.8 10*3/uL (ref 3.4–10.8)

## 2017-03-24 LAB — BASIC METABOLIC PANEL
BUN / CREAT RATIO: 16 (ref 12–28)
BUN: 25 mg/dL (ref 8–27)
CHLORIDE: 102 mmol/L (ref 96–106)
CO2: 21 mmol/L (ref 20–29)
CREATININE: 1.52 mg/dL — AB (ref 0.57–1.00)
Calcium: 10.1 mg/dL (ref 8.7–10.3)
GFR calc Af Amer: 40 mL/min/{1.73_m2} — ABNORMAL LOW (ref >59)
GFR calc non Af Amer: 34 mL/min/{1.73_m2} — ABNORMAL LOW (ref >59)
GLUCOSE: 132 mg/dL — AB (ref 65–99)
POTASSIUM: 4.4 mmol/L (ref 3.5–5.2)
SODIUM: 141 mmol/L (ref 134–144)

## 2017-03-24 NOTE — Telephone Encounter (Signed)
-----   Message from Creig Hines, NP sent at 03/24/2017 10:54 AM EST ----- Blood counts stable.  Kidney function likely stable, though abnl compared to 05/2015 (more similar to 2016 numbers).  She should have a f/u bmet in 1-2 wks to assess stability.  I don't think mild kidney dysfunction is contributing to her symptoms.

## 2017-03-24 NOTE — Telephone Encounter (Signed)
Reviewed results and recommendations with patient. Advised that she would need repeat labs in 2 weeks over at the [facility] Entrance. She verbalized understanding of our conversation, agreement with plan, and had no further questions at this time. Order entered for repeat bmet.

## 2017-03-25 ENCOUNTER — Other Ambulatory Visit: Payer: Self-pay | Admitting: Internal Medicine

## 2017-03-25 ENCOUNTER — Telehealth: Payer: Self-pay | Admitting: Internal Medicine

## 2017-03-25 MED ORDER — FLUTICASONE-SALMETEROL 115-21 MCG/ACT IN AERO: 2.0000 | INHALATION_SPRAY | Freq: Two times a day (BID) | RESPIRATORY_TRACT | 12 refills | Status: AC

## 2017-03-25 NOTE — Telephone Encounter (Signed)
Pt states the inhaler that was prescribed was too expensive. Please call to give recommendations on a alternative.

## 2017-03-25 NOTE — Telephone Encounter (Signed)
Attempted 3x to contact patient in regards to inhaler change. Called cell and home #s. Patient aware.

## 2017-03-25 NOTE — Telephone Encounter (Signed)
Sent script for generic advair to her pharmacy. Can discontinue spiriva.

## 2017-03-25 NOTE — Telephone Encounter (Signed)
Pt was prescribed Spiriva Handihaler. She has called and asked for cheaper inahler. Please advise?

## 2017-03-25 NOTE — Telephone Encounter (Signed)
Attempted to contact patient. No vmail.

## 2017-03-25 NOTE — Telephone Encounter (Signed)
Pt returning our call °Please call back ° °

## 2017-04-05 ENCOUNTER — Telehealth: Payer: Self-pay | Admitting: Cardiovascular Disease

## 2017-04-05 NOTE — Telephone Encounter (Signed)
Spoke with patient and advised that she would need to call her PCP for any medications for discomfort and arthritis. She verbalized understanding with no further questions at this time.

## 2017-04-05 NOTE — Telephone Encounter (Signed)
Pt states Dr. Mariah Milling "months ago" called her a rx for her arthritis. Please call and advise if he will be able to do this again

## 2017-04-06 ENCOUNTER — Ambulatory Visit (INDEPENDENT_AMBULATORY_CARE_PROVIDER_SITE_OTHER): Payer: Medicare PPO

## 2017-04-06 ENCOUNTER — Other Ambulatory Visit
Admission: RE | Admit: 2017-04-06 | Discharge: 2017-04-06 | Disposition: A | Discharge status: Home / Self Care | Payer: Medicare PPO | Source: Ambulatory Visit | Attending: Nurse Practitioner | Admitting: Nurse Practitioner

## 2017-04-06 ENCOUNTER — Telehealth: Payer: Self-pay | Admitting: *Deleted

## 2017-04-06 ENCOUNTER — Encounter: Payer: Self-pay | Admitting: *Deleted

## 2017-04-06 ENCOUNTER — Other Ambulatory Visit: Payer: Self-pay

## 2017-04-06 ENCOUNTER — Other Ambulatory Visit: Payer: Self-pay | Admitting: *Deleted

## 2017-04-06 DIAGNOSIS — I10 Essential (primary) hypertension: Secondary | ICD-10-CM

## 2017-04-06 DIAGNOSIS — I5032 Chronic diastolic (congestive) heart failure: Secondary | ICD-10-CM

## 2017-04-06 DIAGNOSIS — Z79899 Other long term (current) drug therapy: Secondary | ICD-10-CM

## 2017-04-06 LAB — BASIC METABOLIC PANEL
ANION GAP: 10 (ref 5–15)
BUN: 36 mg/dL — ABNORMAL HIGH (ref 6–20)
CHLORIDE: 104 mmol/L (ref 101–111)
CO2: 23 mmol/L (ref 22–32)
Calcium: 9.7 mg/dL (ref 8.9–10.3)
Creatinine, Ser: 1.47 mg/dL — ABNORMAL HIGH (ref 0.44–1.00)
GFR calc Af Amer: 41 mL/min — ABNORMAL LOW (ref >60)
GFR, EST NON AFRICAN AMERICAN: 35 mL/min — AB (ref >60)
GLUCOSE: 184 mg/dL — AB (ref 65–99)
Potassium: 3.9 mmol/L (ref 3.5–5.1)
Sodium: 137 mmol/L (ref 135–145)

## 2017-04-06 LAB — ECHOCARDIOGRAM COMPLETE
AOASC: 32 cm
AV Area mean vel: 1.99 cm2
AV VEL mean LVOT/AV: 0.78
AVA: 1.96 cm2
AVAREAMEANVIN: 1.05 cm2/m2
AVAREAVTIIND: 1.03 cm2/m2
AVG: 3 mmHg
Area-P 1/2: 3.67 cm2
CHL CUP AV VEL: 1.96
CHL CUP MV DEC (S): 204
CHL CUP TV REG PEAK VELOCITY: 421 cm/s
DOP CAL AO MEAN VELOCITY: 82.6 cm/s
E/e' ratio: 8.64
EWDT: 204 ms
FS: 18 % — AB (ref 28–44)
IVS/LV PW RATIO, ED: 1.36
LA diam end sys: 39 mm
LA diam index: 2.05 cm/m2
LA vol A4C: 32.7 ml
LA vol index: 20.3 mL/m2
LASIZE: 39 mm
LAVOL: 38.5 mL
LV E/e' medial: 8.64
LV TDI E'LATERAL: 7.22
LV e' LATERAL: 7.22 cm/s
LVEEAVG: 8.64
LVOT SV: 46 mL
LVOT VTI: 18.1 cm
LVOT area: 2.54 cm2
LVOT peak VTI: 0.77 cm
LVOTD: 18 mm
Lateral S' vel: 6.02 cm/s
MVPKAVEL: 69.2 m/s
MVPKEVEL: 62.4 m/s
MVSPHT: 60 ms
PW: 11 mm — AB (ref 0.6–1.1)
RV TAPSE: 11.8 mm
TDI e' medial: 3
TRMAXVEL: 421 cm/s
VTI: 23.4 cm
Valve area index: 1.03

## 2017-04-06 MED ORDER — POTASSIUM CHLORIDE CRYS ER 20 MEQ PO TBCR: 20.0000 meq | EXTENDED_RELEASE_TABLET | Freq: Every day | ORAL | 3 refills | Status: AC

## 2017-04-06 MED ORDER — FUROSEMIDE 20 MG PO TABS: 40.0000 mg | ORAL_TABLET | Freq: Every day | ORAL | 3 refills | Status: AC

## 2017-04-06 NOTE — Telephone Encounter (Signed)
I've attempted to call every contact # on patient file. 2 inhalers have been sent and patient has declined Spriva--due to cost. Advair HFA 115-21 is not a covered drug. Patient does not have vmail set up on any line. A letter will be mailed.

## 2017-04-11 ENCOUNTER — Emergency Department: Payer: Medicare PPO

## 2017-04-11 ENCOUNTER — Encounter: Payer: Self-pay | Admitting: Internal Medicine

## 2017-04-11 ENCOUNTER — Inpatient Hospital Stay
Admission: EM | Admit: 2017-04-11 | Discharge: 2017-04-23 | DRG: 308 | Disposition: E | Payer: Medicare PPO | Attending: Internal Medicine | Admitting: Internal Medicine

## 2017-04-11 DIAGNOSIS — Z7902 Long term (current) use of antithrombotics/antiplatelets: Secondary | ICD-10-CM | POA: Diagnosis not present

## 2017-04-11 DIAGNOSIS — J9600 Acute respiratory failure, unspecified whether with hypoxia or hypercapnia: Secondary | ICD-10-CM | POA: Diagnosis not present

## 2017-04-11 DIAGNOSIS — I2510 Atherosclerotic heart disease of native coronary artery without angina pectoris: Secondary | ICD-10-CM | POA: Diagnosis present

## 2017-04-11 DIAGNOSIS — J449 Chronic obstructive pulmonary disease, unspecified: Secondary | ICD-10-CM | POA: Diagnosis present

## 2017-04-11 DIAGNOSIS — M069 Rheumatoid arthritis, unspecified: Secondary | ICD-10-CM | POA: Diagnosis present

## 2017-04-11 DIAGNOSIS — E669 Obesity, unspecified: Secondary | ICD-10-CM | POA: Diagnosis present

## 2017-04-11 DIAGNOSIS — I469 Cardiac arrest, cause unspecified: Secondary | ICD-10-CM | POA: Diagnosis present

## 2017-04-11 DIAGNOSIS — Z7984 Long term (current) use of oral hypoglycemic drugs: Secondary | ICD-10-CM

## 2017-04-11 DIAGNOSIS — Z79891 Long term (current) use of opiate analgesic: Secondary | ICD-10-CM | POA: Diagnosis not present

## 2017-04-11 DIAGNOSIS — E860 Dehydration: Secondary | ICD-10-CM | POA: Diagnosis present

## 2017-04-11 DIAGNOSIS — N183 Chronic kidney disease, stage 3 (moderate): Secondary | ICD-10-CM | POA: Diagnosis present

## 2017-04-11 DIAGNOSIS — I130 Hypertensive heart and chronic kidney disease with heart failure and stage 1 through stage 4 chronic kidney disease, or unspecified chronic kidney disease: Secondary | ICD-10-CM | POA: Diagnosis present

## 2017-04-11 DIAGNOSIS — Z87891 Personal history of nicotine dependence: Secondary | ICD-10-CM

## 2017-04-11 DIAGNOSIS — Z833 Family history of diabetes mellitus: Secondary | ICD-10-CM | POA: Diagnosis not present

## 2017-04-11 DIAGNOSIS — I5032 Chronic diastolic (congestive) heart failure: Secondary | ICD-10-CM | POA: Diagnosis present

## 2017-04-11 DIAGNOSIS — I442 Atrioventricular block, complete: Secondary | ICD-10-CM | POA: Diagnosis present

## 2017-04-11 DIAGNOSIS — Z9981 Dependence on supplemental oxygen: Secondary | ICD-10-CM

## 2017-04-11 DIAGNOSIS — I959 Hypotension, unspecified: Secondary | ICD-10-CM

## 2017-04-11 DIAGNOSIS — Z79899 Other long term (current) drug therapy: Secondary | ICD-10-CM

## 2017-04-11 DIAGNOSIS — Z7982 Long term (current) use of aspirin: Secondary | ICD-10-CM

## 2017-04-11 DIAGNOSIS — Z6838 Body mass index (BMI) 38.0-38.9, adult: Secondary | ICD-10-CM

## 2017-04-11 DIAGNOSIS — Z881 Allergy status to other antibiotic agents status: Secondary | ICD-10-CM | POA: Diagnosis not present

## 2017-04-11 DIAGNOSIS — Z8249 Family history of ischemic heart disease and other diseases of the circulatory system: Secondary | ICD-10-CM | POA: Diagnosis not present

## 2017-04-11 DIAGNOSIS — H409 Unspecified glaucoma: Secondary | ICD-10-CM | POA: Diagnosis present

## 2017-04-11 DIAGNOSIS — E1122 Type 2 diabetes mellitus with diabetic chronic kidney disease: Secondary | ICD-10-CM | POA: Diagnosis present

## 2017-04-11 DIAGNOSIS — Z951 Presence of aortocoronary bypass graft: Secondary | ICD-10-CM

## 2017-04-11 DIAGNOSIS — E785 Hyperlipidemia, unspecified: Secondary | ICD-10-CM | POA: Diagnosis present

## 2017-04-11 LAB — BASIC METABOLIC PANEL
ANION GAP: 21 — AB (ref 5–15)
BUN: 43 mg/dL — ABNORMAL HIGH (ref 6–20)
CO2: 12 mmol/L — ABNORMAL LOW (ref 22–32)
Calcium: 9.5 mg/dL (ref 8.9–10.3)
Chloride: 103 mmol/L (ref 101–111)
Creatinine, Ser: 2.23 mg/dL — ABNORMAL HIGH (ref 0.44–1.00)
GFR calc Af Amer: 24 mL/min — ABNORMAL LOW (ref >60)
GFR, EST NON AFRICAN AMERICAN: 21 mL/min — AB (ref >60)
GLUCOSE: 214 mg/dL — AB (ref 65–99)
POTASSIUM: 6.3 mmol/L — AB (ref 3.5–5.1)
Sodium: 136 mmol/L (ref 135–145)

## 2017-04-11 LAB — CBC WITH DIFFERENTIAL/PLATELET
BASOS ABS: 0 10*3/uL (ref 0–0.1)
Basophils Relative: 0 %
EOS PCT: 2 %
Eosinophils Absolute: 0.2 10*3/uL (ref 0–0.7)
HCT: 58.8 % — ABNORMAL HIGH (ref 35.0–47.0)
Hemoglobin: 17.7 g/dL — ABNORMAL HIGH (ref 12.0–16.0)
LYMPHS ABS: 4.6 10*3/uL — AB (ref 1.0–3.6)
LYMPHS PCT: 43 %
MCH: 28 pg (ref 26.0–34.0)
MCHC: 30.1 g/dL — ABNORMAL LOW (ref 32.0–36.0)
MCV: 93.1 fL (ref 80.0–100.0)
Monocytes Absolute: 0.7 10*3/uL (ref 0.2–0.9)
Monocytes Relative: 7 %
Neutro Abs: 5.2 10*3/uL (ref 1.4–6.5)
Neutrophils Relative %: 48 %
PLATELETS: 141 10*3/uL — AB (ref 150–440)
RBC: 6.31 MIL/uL — ABNORMAL HIGH (ref 3.80–5.20)
RDW: 18.1 % — ABNORMAL HIGH (ref 11.5–14.5)
WBC: 10.7 10*3/uL (ref 3.6–11.0)

## 2017-04-11 LAB — LACTIC ACID, PLASMA: LACTIC ACID, VENOUS: 6.4 mmol/L — AB (ref 0.5–1.9)

## 2017-04-11 LAB — GLUCOSE, CAPILLARY: Glucose-Capillary: 201 mg/dL — ABNORMAL HIGH (ref 65–99)

## 2017-04-11 LAB — TROPONIN I: Troponin I: 0.03 ng/mL (ref <0.03)

## 2017-04-11 MED ORDER — EPINEPHRINE PF 1 MG/10ML IJ SOSY
PREFILLED_SYRINGE | INTRAMUSCULAR | Status: AC | PRN
  Administered 2017-04-11: 1 via INTRAVENOUS

## 2017-04-11 MED ORDER — ALTEPLASE 100 MG IV SOLR
INTRAVENOUS | Status: AC
  Filled 2017-04-11: qty 100

## 2017-04-11 MED ORDER — PANTOPRAZOLE SODIUM 40 MG IV SOLR: 40.0000 mg | Freq: Two times a day (BID) | INTRAVENOUS | Status: DC

## 2017-04-11 MED ORDER — CALCIUM CHLORIDE 10 % IV SOLN
INTRAVENOUS | Status: AC
  Filled 2017-04-11: qty 10

## 2017-04-11 MED ORDER — SODIUM CHLORIDE 0.9 % IV SOLN: 250.0000 mL | Freq: Once | INTRAVENOUS | Status: DC

## 2017-04-11 MED ORDER — SODIUM BICARBONATE 8.4 % IV SOLN
INTRAVENOUS | Status: AC | PRN
  Administered 2017-04-11: 50 meq via INTRAVENOUS

## 2017-04-11 MED ORDER — ALTEPLASE (PULMONARY EMBOLISM) INFUSION
50.0000 mg | Freq: Once | INTRAVENOUS | Status: AC
  Administered 2017-04-11: 50 mg via INTRAVENOUS

## 2017-04-11 MED ORDER — CALCIUM CHLORIDE 10 % IV SOLN
INTRAVENOUS | Status: AC | PRN
  Administered 2017-04-11 (×2): 1 g via INTRAVENOUS

## 2017-04-11 MED ORDER — ROCURONIUM BROMIDE 50 MG/5ML IV SOLN
INTRAVENOUS | Status: AC | PRN
  Administered 2017-04-11: 70 mg via INTRAVENOUS

## 2017-04-11 MED ORDER — INSULIN ASPART 100 UNIT/ML ~~LOC~~ SOLN: 0.0000 [IU] | SUBCUTANEOUS | Status: DC

## 2017-04-11 MED ORDER — KETAMINE HCL 10 MG/ML IJ SOLN
INTRAMUSCULAR | Status: AC | PRN
  Administered 2017-04-11: 100 mg via INTRAVENOUS

## 2017-04-11 MED ORDER — DEXTROSE 5 % IV SOLN
0.0000 ug/min | Freq: Once | INTRAVENOUS | Status: AC
  Administered 2017-04-11: 2 ug/min via INTRAVENOUS
  Filled 2017-04-11: qty 4

## 2017-04-11 MED ORDER — SODIUM CHLORIDE 0.9 % IV SOLN: 1.0000 g | Freq: Once | INTRAVENOUS | Status: DC

## 2017-04-11 MED ORDER — EPINEPHRINE PF 1 MG/ML IJ SOLN
0.5000 ug/min | INTRAVENOUS | Status: DC
  Filled 2017-04-11: qty 4

## 2017-04-11 MED ORDER — EPINEPHRINE PF 1 MG/10ML IJ SOSY
PREFILLED_SYRINGE | INTRAMUSCULAR | Status: AC | PRN
  Administered 2017-04-11 (×2): 1 via INTRAVENOUS

## 2017-04-11 MED ORDER — SODIUM CHLORIDE 0.9 % IV SOLN: INTRAVENOUS | Status: DC

## 2017-04-12 MED FILL — Medication: Qty: 1 | Status: AC

## 2017-04-16 ENCOUNTER — Ambulatory Visit: Payer: Medicare PPO | Admitting: Cardiovascular Disease

## 2017-04-23 NOTE — Code Documentation (Signed)
Family at beside. Family given emotional support. 

## 2017-04-23 NOTE — ED Provider Notes (Signed)
[facility] Emergency Department Provider Note  ____________________________________________   First MD Initiated Contact with Patient 04-21-17 1519     (approximate)  I have reviewed the triage vital signs and the nursing notes.   HISTORY  Chief Complaint No chief complaint on file.  Level 5 exemption history limited by the patient's clinical condition  HPI Toni Marshall is a 72 y.o. female who comes to the emergency department by EMS after an out of hospital cardiac arrest.  Apparently the patient was at home reported shortness of breath and fell over.  EMS arrived and found the patient was in pulseless electrical activity performed 3 rounds of ACLS including supraglottic airway and regained pulses.  No other medications given and she was transported.  Past Medical History:  Diagnosis Date  . CAD (coronary artery disease)    a. 2002 s/p CABG; b. 2012 s/p redo CABG; c. 10/2011 Cath: LM Ca2+, LAD 99/65m 50d, D2 90ost, LCX 962mRCA 60p, LIMA->LAD 100ost, VG->OM 100ost, VG->dRCA 30p, 4021mF 40%-->Med Rx; d. 03/2014 MV: large area of anterior/apical scar w/o ischemia (GI uptake noted), EF 50%->Med Rx.  . Cardiogenic shock (HCCVincent . Chronic diastolic CHF (congestive heart failure) (HCCUnalaska  a. 2014 Echo: EF 50-55%, nl RV size/fxn, nl RVSP; b. 2016 Echo: Ef 60-65%, DD, nl RVSP.  . CMarland KitchenD (chronic kidney disease), stage III (HCCFols[place] . COPD (chronic obstructive pulmonary disease) (HCCWest[place]  a. Home O2 @ 3lpm.  . Glaucoma   . Hyperlipidemia   . Hypertension   . Morbid obesity (HCCVersa[place] . Rheumatoid arthritis(714.0)   . [facility]   Patient Active Problem List   Diagnosis Date Noted  . Cardiac arrest (HCCSmoketown2/21-Apr-2017 Acute respiratory failure (HCCHighland2/04/21/2017 CHB (complete heart block) (HCCBishop Hills2/04/21/2017 Hypotension 02/02-27-2019 Chest pain 05/26/2015  . Chronic respiratory failure with hypoxia (HCCFountain Hills4/03/2015  . ILD (interstitial  lung disease) (HCCLake Park4/03/2015  . Essential hypertension 03/19/2015  . Chronic obstructive pulmonary disease (COPD) (HCCCorder1/24/2017  . Chronic diastolic CHF (congestive heart failure) (HCCFortville6/27/2016  . Back pain 04/13/2014  . Morbid obesity (HCCMatagorda8/14/2015  . CAD (coronary artery disease) of artery bypass graft 12/31/2011  . Diabetes (HCCBrookside1/08/2011  . Hyperlipidemia 12/31/2011    Past Surgical History:  Procedure Laterality Date  . [place]     [facility]  . COLONOSCOPY    . CORONARY ARTERY BYPASS GRAFT  2012  . CORONARY ARTERY BYPASS GRAFT  2002  . EYE SURGERY     left    Prior to Admission medications   Medication Sig Start Date End Date Taking? Authorizing Provider  aspirin EC 81 MG tablet Take 81 mg by mouth daily.   Yes [provider]  atorvastatin (LIPITOR) 80 MG tablet TAKE 1 TABLET (80 MG TOTAL) BY MOUTH DAILY. 12/09/15  Yes Gollan, TimKathlene NovemberD  bimatoprost (LUMIGAN) 0.01 % SOLN Place 1 drop into the left eye at bedtime.   Yes [provider]  brimonidine (ALPHAGAN) 0.2 % ophthalmic solution Place into both eyes 2 (two) times daily. 02/26/17  Yes [provider]  carvedilol (COREG) 3.125 MG tablet Take 1 tablet (3.125 mg total) by mouth 2 (two) times daily with a meal. 04/24/16  Yes Gollan, TimKathlene NovemberD  clopidogrel (PLAVIX) 75 MG tablet Take 1 tablet (  75 mg total) by mouth daily. 04/24/16  Yes Minna Merritts, MD  fluticasone-salmeterol (ADVAIR HFA) 956-38 MCG/ACT inhaler Inhale 2 puffs into the lungs 2 (two) times daily. Rinse mouth after use. 03/25/17  Yes Laverle Hobby, MD  folic acid (FOLVITE) 756 MCG tablet Take 800 mcg by mouth daily.    Yes [provider]  furosemide (LASIX) 20 MG tablet Take 2 tablets (40 mg total) by mouth daily. Patient taking differently: Take 20 mg by mouth daily as needed.  04/06/17  Yes Theora Gianotti, NP  glipiZIDE (GLUCOTROL XL) 2.5  MG 24 hr tablet Take 2.5 mg by mouth daily with breakfast.   Yes [provider]  isosorbide mononitrate (IMDUR) 30 MG 24 hr tablet TAKE 1 TABLET BY MOUTH EVERY DAY 10/29/15  Yes Gollan, Kathlene November, MD  latanoprost (XALATAN) 0.005 % ophthalmic solution Place 1 drop into the left eye at bedtime. 04/03/17  Yes [provider]  lisinopril (PRINIVIL,ZESTRIL) 5 MG tablet Take 1 tablet (5 mg total) by mouth daily. 04/24/16  Yes Minna Merritts, MD  naproxen (NAPROSYN) 500 MG tablet Take 500 mg by mouth 2 (two) times daily with a meal.   Yes [provider]  OXYGEN Inhale 3 L/min into the lungs continuous.   Yes [provider]  potassium chloride SA (K-DUR,KLOR-CON) 20 MEQ tablet Take 1 tablet (20 mEq total) by mouth daily. Patient taking differently: Take 8 mEq by mouth daily.  04/06/17  Yes Theora Gianotti, NP  tiotropium (SPIRIVA) 18 MCG inhalation capsule Place 1 capsule (18 mcg total) into inhaler and inhale daily. 03/23/17  Yes Laverle Hobby, MD  traMADol (ULTRAM) 50 MG tablet Take 50 mg by mouth every 6 (six) hours as needed.   Yes [provider]  CALCIUM PLUS VITAMIN D3 600-500 MG-UNIT CAPS Take 1 tablet by mouth 2 (two) times daily.  04/10/14   [provider]  magnesium oxide (MAG-OX) 400 MG tablet Take 400 mg by mouth 2 (two) times daily.     [provider]  ranolazine (RANEXA) 1000 MG SR tablet Take 1,000 mg by mouth 2 (two) times daily.    [provider]    Allergies Vancomycin  Family History  Problem Relation Age of Onset  . Heart attack Mother   . Hypertension Father   . Diabetes Father   . Stroke Sister     Social History Social History   Tobacco Use  . Smoking status: Former Smoker    Packs/day: 0.50    Years: 35.00    Pack years: 17.50    Types: Cigarettes  . Smokeless tobacco: Never Used  Substance Use Topics  . Alcohol use: No    Alcohol/week: 0.0 oz  . Drug use: No    Review  of Systems Level 5 exemption history limited by the patient's clinical condition  ____________________________________________   PHYSICAL EXAM:  VITAL SIGNS: ED Triage Vitals [04-26-2017 1514]  Enc Vitals Group     BP (!) 108/51     Pulse Rate 98     Resp 18     Temp      Temp src      SpO2      Weight      Height      Head Circumference      Peak Flow      Pain Score      Pain Loc      Pain Edu?      Excl. in  GC?     Constitutional: Intubated with supraglottic airway and tolerating with no sedation no active CPR.  She does localize to painful stimulus Eyes: Pupils 6 mm and sluggish bilaterally Head: Atraumatic. Nose: No congestion/rhinnorhea. Mouth/Throat: Tolerating supra glottic airway Neck: No midline tenderness or step-offs Cardiovascular: Normal rate, regular rhythm. Grossly normal heart sounds.  Good peripheral circulation. Respiratory: Coarse breath sounds bilaterally with bagging Gastrointestinal: Soft nontender Musculoskeletal: No lower extremity edema   Neurologic: Localizes to painful stimulus Skin:  Skin is warm, dry and intact. No rash noted. Psychiatric: Comatose   ____________________________________________   DIFFERENTIAL includes but not limited to  STEMI, hyperkalemia, pulmonary embolism ____________________________________________   LABS (all labs ordered are listed, but only abnormal results are displayed)  Labs Reviewed  CBC WITH DIFFERENTIAL/PLATELET - Abnormal; Notable for the following components:      Result Value   RBC 6.31 (*)    Hemoglobin 17.7 (*)    HCT 58.8 (*)    MCHC 30.1 (*)    RDW 18.1 (*)    Platelets 141 (*)    Lymphs Abs 4.6 (*)    All other components within normal limits  BASIC METABOLIC PANEL - Abnormal; Notable for the following components:   Potassium 6.3 (*)    CO2 12 (*)    Glucose, Bld 214 (*)    BUN 43 (*)    Creatinine, Ser 2.23 (*)    GFR calc non Af Amer 21 (*)    GFR calc Af Amer 24 (*)    Anion  gap 21 (*)    All other components within normal limits  TROPONIN I - Abnormal; Notable for the following components:   Troponin I 0.03 (*)    All other components within normal limits  LACTIC ACID, PLASMA - Abnormal; Notable for the following components:   Lactic Acid, Venous 6.4 (*)    All other components within normal limits  GLUCOSE, CAPILLARY - Abnormal; Notable for the following components:   Glucose-Capillary 201 (*)    All other components within normal limits  CULTURE, BLOOD (ROUTINE X 2)  CULTURE, BLOOD (ROUTINE X 2)    Lab work reviewed by me with hemoconcentration consistent with dehydration.  Elevated potassium could cause cardiac arrest.  Elevated lactic acid nonspecific but suggestive of significant hypoperfusion __________________________________________  EKG  ED ECG REPORT I, Darel Hong, the attending physician, personally viewed and interpreted this ECG.  Date: May 02, 2017 EKG Time: 1509 Rate: 83 Rhythm: sinus rhythm QRS Axis: Rightward axis Intervals: Prolonged QTC and first-degree AV block ST/T Wave abnormalities: normal Narrative Interpretation: New right bundle branch block quite wide different from 1 month ago concerning for acute right heart strain  ED ECG REPORT I, Darel Hong, the attending physician, personally viewed and interpreted this ECG.  Date: 2017/05/02 EKG Time: 1543 Rate: 35 Rhythm: Complete heart block QRS Axis: Rightward axis ST/T Wave abnormalities: normal Narrative Interpretation: Complete A-V dissociation at 35 abnormal EKG  ED ECG REPORT I, Darel Hong, the attending physician, personally viewed and interpreted this ECG.  Date: May 02, 2017 EKG Time: 1607 Rate: 69 Rhythm: Transcutaneously paced rhythm with wide complex native rhythm coming through intermittently  Narrative Interpretation: Abnormal EKG although no obvious signs of acute ischemia  ____________________________________________  RADIOLOGY  Chest  x-ray reviewed by me with ET tube in adequate position ____________________________________________   PROCEDURES  Procedure(s) performed: Yes  .Critical Care Performed by: Darel Hong, MD Authorized by: Darel Hong, MD   Critical care provider statement:  Critical care time (minutes):  80   Critical care time was exclusive of:  Separately billable procedures and treating other patients   Critical care was necessary to treat or prevent imminent or life-threatening deterioration of the following conditions:  Shock, respiratory failure, circulatory failure and cardiac failure   Critical care was time spent personally by me on the following activities:  Development of treatment plan with patient or surrogate, discussions with consultants, evaluation of patient's response to treatment, examination of patient, obtaining history from patient or surrogate, ordering and performing treatments and interventions, ordering and review of laboratory studies, ordering and review of radiographic studies, pulse oximetry, re-evaluation of patient's condition and review of old charts Procedure Name: Intubation Date/Time: Apr 16, 2017 4:21 PM Performed by: Darel Hong, MD Pre-anesthesia Checklist: Patient identified, Emergency Drugs available, Suction available and Patient being monitored Preoxygenation: Pre-oxygenation with 100% oxygen Induction Type: IV induction and Rapid sequence Ventilation: Oral airway inserted - appropriate to patient size Laryngoscope Size: Mac and 4 Grade View: Grade II Tube size: 7.5 mm Number of attempts: 1 Placement Confirmation: ETT inserted through vocal cords under direct vision,  CO2 detector and Breath sounds checked- equal and bilateral Secured at: 23 cm Tube secured with: ETT holder Dental Injury: Teeth and Oropharynx as per pre-operative assessment        Critical Care performed: Yes Observation:  no ____________________________________________   INITIAL IMPRESSION / ASSESSMENT AND PLAN / ED COURSE  Pertinent labs & imaging results that were available during my care of the patient were reviewed by me and considered in my medical decision making (see chart for details).  The patient arrived after cardiac arrest.  First EKG shows new right heart strain this along with hypoxia and reported shortness of breath raises concern for acute pulmonary embolism as the etiology of her symptoms.  Blood sugar confirmed to be normal.  The patient was successfully intubated with rocuronium and ketamine.  Shortly thereafter she began to become hypotensive so norepinephrine added.  Please see nursing notes for exact timing.  Broad labs and antibiotics given given her postarrest state.  She will require CT scan of both her head and her chest to evaluate for pulmonary embolism.  I discussed with the hospitalist Dr. Doy Hutching who has graciously agreed to admit the patient to his service.  Family present at for large portions of the resuscitation with clergy and kept up-to-date.     The patient became increasingly hypotensive and hypoxic despite endotracheal intubation and rising vasopressors  .She was unstable to go to CT scan and I had a high clinical suspicion for pulmonary embolism so decision was made to give 50 mg of alteplase IV drip for presumptive pulmonary embolism.  _----------------------------------------- 4:20 PM on 04/16/17 -----------------------------------------  I spoke with cardiologist Dr. Irish Lack I reviewed the patient's EKGs.  He agrees that her right bundle devolved into complete heart block.  He will come see the patient in the intensive care unit today.  As she did receive 50 mg of alteplase today she may not be a candidate for an intravenous pacemaker.  _____________   ----------------------------------------- 4:40 PM on 04/16/2017 ----------------------------------------- The  patient once again devolved into pulseless electrical activity.  Bedside ultrasound confirmed no cardiac activity.  4 rounds of ACLS performed with patient's family at bedside.  At this point I asked the room if anyone else had any other further thoughts.  The patient had multiple cardiac arrests and was never in a shockable rhythm.  Despite aggressive life-saving interventions, time  of death was called   FINAL CLINICAL IMPRESSION(S) / ED DIAGNOSES  Final diagnoses:  Cardiac arrest [facility])      NEW MEDICATIONS STARTED DURING THIS VISIT:  Discharge Medication List as of 2017/04/24  7:12 PM       Note:  This document was prepared using Dragon voice recognition software and may include unintentional dictation errors.     Darel Hong, MD 04/13/17 2206

## 2017-04-23 NOTE — Progress Notes (Signed)
   Called for consult due to complete heart block.  Patient with cardiac arrest earlier today and was resuscitated.  Treated with thrombolytics for presumed PE.  Initial postresuscitation ECG showed NSR with RBBB.  She later developed complete heart block requiring transcutaneous pacing.  With high dose pressors, systolic of 90 mm Hg was achieved.    Plan was for CT chest and head.  If rhythm remained unstable, would have to consider transvenous pacer. Prior to imaging, the patient again coded and could not be resuscitated in the ER.  Corky Crafts, MD

## 2017-04-23 NOTE — Progress Notes (Signed)
Chaplain responded to a request from the ED. Chaplain arrived at 16:00 and provided prayer, and emotional and spiritual support. Chaplain provided care to niece, best friend, daughter, and nephews. Patient's pastor arrived and Chaplain worked with family as they processed the loss.

## 2017-04-23 NOTE — ED Notes (Signed)
RN spoke with Vito Backers from [facility] at this time

## 2017-04-23 NOTE — H&P (Signed)
History and Physical    Toni Marshall BTC:[id number] DOB: [birth year] DOA: 2017/04/23  Referring physician: Dr. Lamont Snowball PCP: Emogene Morgan, MD  Specialists: none  Chief Complaint: cardiac arrest  HPI: Toni Marshall is a 72 y.o. female has a past medical history significant for CAD and COPD with witnessed arrest at home. EMS called and CPR was initiated within 30  Minutes of being down. Given Epi x 3 and intubated in ER. Pt is unresponsive. Pupils fixed dilated. CHB noted on EKG. Now paced in ER and on Levophed. She is now admitted  Review of Systems: unable to obtain as pt is unresponsive  Past Medical History:  Diagnosis Date  . CAD (coronary artery disease)    a. 2002 s/p CABG; b. 2012 s/p redo CABG; c. 10/2011 Cath: LM Ca2+, LAD 99/29m, 50d, D2 90ost, LCX 68m, RCA 60p, LIMA->LAD 100ost, VG->OM 100ost, VG->dRCA 30p, 33m, EF 40%-->Med Rx; d. 03/2014 MV: large area of anterior/apical scar w/o ischemia (GI uptake noted), EF 50%->Med Rx.  . Cardiogenic shock (HCC)   . Chronic diastolic CHF (congestive heart failure) (HCC)    a. 2014 Echo: EF 50-55%, nl RV size/fxn, nl RVSP; b. 2016 Echo: Ef 60-65%, DD, nl RVSP.  Marland Kitchen CKD (chronic kidney disease), stage III (HCC)   . COPD (chronic obstructive pulmonary disease) (HCC)    a. Home O2 @ 3lpm.  . Glaucoma   . Hyperlipidemia   . Hypertension   . Morbid obesity (HCC)   . Rheumatoid arthritis(714.0)   . Type II diabetes mellitus (HCC)    Past Surgical History:  Procedure Laterality Date  . CARDIAC CATHETERIZATION     x2 [facility]  . CARD[facility]AC CATHETERIZATION     [facility]  . COLONOSCOPY    . CORONARY ARTERY BYPASS GRAFT  2012  . CORONARY ARTERY BYPASS GRAFT  2002  . EYE SURGERY     left   Social History:  reports that she has quit smoking. Her smoking use included cigarettes. She has a 17.50 pack-year smoking history. she has never used smokeless tobacco. She reports that she does not drink alcohol or use drugs.  Allergies  Allergen Reactions   . Vancomycin     Hypotension & hypoxia     Family History  Problem Relation Age of Onset  . Heart attack Mother   . Hypertension Father   . Diabetes Father   . Stroke Sister     Prior to Admission medications   Medication Sig Start Date End Date Taking? Authorizing Provider  aspirin EC 81 MG tablet Take 81 mg by mouth daily.   Yes [provider]  atorvastatin (LIPITOR) 80 MG tablet TAKE 1 TABLET (80 MG TOTAL) BY MOUTH DAILY. 12/09/15  Yes Gollan, Tollie Pizza, MD  bimatoprost (LUMIGAN) 0.01 % SOLN Place 1 drop into the left eye at bedtime.   Yes [provider]  brimonidine (ALPHAGAN) 0.2 % ophthalmic solution Place into both eyes 2 (two) times daily. 02/26/17  Yes [provider]  carvedilol (COREG) 3.125 MG tablet Take 1 tablet (3.125 mg total) by mouth 2 (two) times daily with a meal. 04/24/16  Yes Gollan, Tollie Pizza, MD  clopidogrel (PLAVIX) 75 MG tablet Take 1 tablet (75 mg total) by mouth daily. 04/24/16  Yes Antonieta Iba, MD  fluticasone-salmeterol (ADVAIR HFA) 112-16 MCG/ACT inhaler Inhale 2 puffs into the lungs 2 (two) times daily. Rinse mouth after use. 03/25/17  Yes Shane Crutch, MD  folic acid (FOLVITE) 400 MCG tablet  Take 800 mcg by mouth daily.    Yes [provider]  furosemide (LASIX) 20 MG tablet Take 2 tablets (40 mg total) by mouth daily. Patient taking differently: Take 20 mg by mouth daily as needed.  04/06/17  Yes Creig Hines, NP  glipiZIDE (GLUCOTROL XL) 2.5 MG 24 hr tablet Take 2.5 mg by mouth daily with breakfast.   Yes [provider]  isosorbide mononitrate (IMDUR) 30 MG 24 hr tablet TAKE 1 TABLET BY MOUTH EVERY DAY 10/29/15  Yes Gollan, Tollie Pizza, MD  latanoprost (XALATAN) 0.005 % ophthalmic solution Place 1 drop into the left eye at bedtime. 04/03/17  Yes [provider]  lisinopril (PRINIVIL,ZESTRIL) 5 MG tablet Take 1 tablet (5 mg total) by mouth daily. 04/24/16  Yes Antonieta Iba, MD   naproxen (NAPROSYN) 500 MG tablet Take 500 mg by mouth 2 (two) times daily with a meal.   Yes [provider]  OXYGEN Inhale 3 L/min into the lungs continuous.   Yes [provider]  potassium chloride SA (K-DUR,KLOR-CON) 20 MEQ tablet Take 1 tablet (20 mEq total) by mouth daily. Patient taking differently: Take 8 mEq by mouth daily.  04/06/17  Yes Creig Hines, NP  tiotropium (SPIRIVA) 18 MCG inhalation capsule Place 1 capsule (18 mcg total) into inhaler and inhale daily. 03/23/17  Yes Shane Crutch, MD  traMADol (ULTRAM) 50 MG tablet Take 50 mg by mouth every 6 (six) hours as needed.   Yes [provider]  CALCIUM PLUS VITAMIN D3 600-500 MG-UNIT CAPS Take 1 tablet by mouth 2 (two) times daily.  04/10/14   [provider]  magnesium oxide (MAG-OX) 400 MG tablet Take 400 mg by mouth 2 (two) times daily.     [provider]  ranolazine (RANEXA) 1000 MG SR tablet Take 1,000 mg by mouth 2 (two) times daily.    [provider]   Physical Exam: Vitals:   2017-04-28 1514 2017-04-28 1515  BP: (!) 108/51 (!) 70/58  Pulse: 98   Resp: 18 (!) 22     General: Wirt/AT, obese, in severe distress  Eyes: pupils dilated and non-reactive, conjunctiva clear, sclera non-icteric  ENT: moist oropharynx without exudate, TM's benign, dentition fair  Neck: supple, no lymphadenopathy. No bruits or thyromegaly  Cardiovascular: regular rate without MRG;faint peripheral pulses, no JVD, 2+ peripheral edema  Respiratory: diffuse rhonchi with expiratory wheezes and decreased breath sounds throughout. Intubated. No dullness  Abdomen: soft, non tender to palpation, positive bowel sounds, no guarding, no rebound  Skin: no rashes or lesions  Musculoskeletal: normal bulk and tone, no joint swelling  Psychiatric: unresponsive  Neurologic: unable to assess at this time  Labs on Admission:  Basic Metabolic Panel: Recent Labs  Lab 04/06/17 1218   NA 137  K 3.9  CL 104  CO2 23  GLUCOSE 184*  BUN 36*  CREATININE 1.47*  CALCIUM 9.7   Liver Function Tests: No results for input(s): AST, ALT, ALKPHOS, BILITOT, PROT, ALBUMIN in the last 168 hours. No results for input(s): LIPASE, AMYLASE in the last 168 hours. No results for input(s): AMMONIA in the last 168 hours. CBC: Recent Labs  Lab 04/28/2017 1430  WBC 10.7  NEUTROABS 5.2  HGB 17.7*  HCT 58.8*  MCV 93.1  PLT 141*   Cardiac Enzymes: No results for input(s): CKTOTAL, CKMB, CKMBINDEX, TROPONINI in the last 168 hours.  BNP (last 3 results) No results for input(s): BNP in the last 8760 hours.  ProBNP (last  3 results) No results for input(s): PROBNP in the last 8760 hours.  CBG: No results for input(s): GLUCAP in the last 168 hours.  Radiological Exams on Admission: No results found.  EKG: Independently reviewed.  Assessment/Plan Principal Problem:   Cardiac arrest [facility]) Active Problems:   Acute respiratory failure (HCC)   CHB (complete heart block) (HCC)   Hypotension   Will admit to ICU as full code with temporary pacing and pressors. Continue mechanical ventilation. STAT consults to the ICU team and Cardiology. Prognosis poor  Diet: NPO Fluids: NS@100  DVT Prophylaxis: IV Heparin  Code Status: FULL  Family Communication: yes  Disposition Plan: TBD  Time spent: 50 min

## 2017-04-23 NOTE — ED Notes (Signed)
Family at bedside with MD

## 2017-04-23 NOTE — Code Documentation (Signed)
Pulse check at this time, no cardiac activity, PEA per MD Rifenbark

## 2017-04-23 NOTE — ED Notes (Signed)
PT arrived EMS from home, called out for SOB , pt was found AMS and began grasping for air per EMS. EMS started coding pt, resived ROSC after aprox , 3 epi given. PT arrives with king airway

## 2017-04-23 NOTE — Code Documentation (Signed)
Pulses back at this time

## 2017-04-23 NOTE — ED Notes (Signed)
Money and dentures given to niece at this time

## 2017-04-23 NOTE — Discharge Summary (Signed)
Pt coded in ER. Discussed with family who wanted no further intervention. Pronounced dead at 4:35pm by Dr. Lamont Snowball. See admission note for details

## 2017-04-23 NOTE — ED Notes (Signed)
Orderly at bedside to take patient, patient medications are in patient belongings bag and will be sent with patient.

## 2017-04-23 NOTE — ED Notes (Signed)
Next of kin , daughter  [phone]

## 2017-04-23 NOTE — Code Documentation (Signed)
Pulse check at this time, no cardiac act per MD Rifenbark, PEA, compressions continued

## 2017-04-23 NOTE — Code Documentation (Signed)
Pulse check at this time , no cardiac act per MD compressions started

## 2017-04-23 NOTE — Code Documentation (Signed)
Compressions started at this time

## 2017-04-23 NOTE — Code Documentation (Signed)
Patient time of death occurred at 1638 

## 2017-04-23 NOTE — ED Triage Notes (Signed)
MD at bedside intubating at this time. 

## 2017-04-23 NOTE — ED Notes (Addendum)
Pacing pt at 72 bpm at this time per MD Rifenbark

## 2017-04-23 NOTE — Code Documentation (Addendum)
compresssions started again at this time, no pulse found , PEA per MD

## 2017-04-23 DEATH — deceased

## 2017-06-15 ENCOUNTER — Ambulatory Visit: Payer: Medicare PPO

## 2017-06-21 ENCOUNTER — Ambulatory Visit: Payer: Medicare PPO | Admitting: Internal Medicine
